# Patient Record
Sex: Female | Born: 1970 | Race: Black or African American | Hispanic: No | Marital: Single | Smoking: Never smoker
Health system: Southern US, Community
[De-identification: ages and names within clinical notes are randomized; demographics above are authoritative.]

## PROBLEM LIST (undated history)

## (undated) DIAGNOSIS — E119 Type 2 diabetes mellitus without complications: Secondary | ICD-10-CM

## (undated) DIAGNOSIS — J329 Chronic sinusitis, unspecified: Secondary | ICD-10-CM

## (undated) DIAGNOSIS — I10 Essential (primary) hypertension: Secondary | ICD-10-CM

## (undated) DIAGNOSIS — J45909 Unspecified asthma, uncomplicated: Secondary | ICD-10-CM

## (undated) DIAGNOSIS — M5412 Radiculopathy, cervical region: Secondary | ICD-10-CM

## (undated) HISTORY — PX: SHOULDER SURGERY: SHX246

## (undated) HISTORY — DX: Chronic sinusitis, unspecified: J32.9

---

## 2003-02-28 DIAGNOSIS — K1120 Sialoadenitis, unspecified: Secondary | ICD-10-CM | POA: Insufficient documentation

## 2004-06-26 ENCOUNTER — Emergency Department: Payer: Self-pay | Admitting: Emergency Medicine

## 2004-10-30 ENCOUNTER — Emergency Department: Payer: Self-pay | Admitting: Emergency Medicine

## 2004-10-31 ENCOUNTER — Emergency Department: Payer: Self-pay | Admitting: Unknown Physician Specialty

## 2004-11-15 ENCOUNTER — Emergency Department: Payer: Self-pay | Admitting: General Practice

## 2005-04-24 ENCOUNTER — Emergency Department: Payer: Self-pay | Admitting: Emergency Medicine

## 2005-08-06 ENCOUNTER — Emergency Department: Payer: Self-pay | Admitting: Emergency Medicine

## 2005-09-13 ENCOUNTER — Emergency Department: Payer: Self-pay | Admitting: Unknown Physician Specialty

## 2005-12-24 ENCOUNTER — Emergency Department: Payer: Self-pay | Admitting: Unknown Physician Specialty

## 2006-05-18 ENCOUNTER — Emergency Department: Payer: Self-pay | Admitting: Internal Medicine

## 2006-07-10 ENCOUNTER — Emergency Department: Payer: Self-pay | Admitting: Emergency Medicine

## 2006-08-05 ENCOUNTER — Emergency Department: Payer: Self-pay | Admitting: General Practice

## 2007-06-02 ENCOUNTER — Emergency Department: Payer: Self-pay | Admitting: Emergency Medicine

## 2007-07-14 ENCOUNTER — Emergency Department: Payer: Self-pay | Admitting: Emergency Medicine

## 2007-10-24 ENCOUNTER — Emergency Department: Payer: Self-pay | Admitting: Emergency Medicine

## 2007-12-07 ENCOUNTER — Emergency Department: Payer: Self-pay | Admitting: Emergency Medicine

## 2008-03-28 ENCOUNTER — Emergency Department: Payer: Self-pay | Admitting: Unknown Physician Specialty

## 2008-04-02 ENCOUNTER — Emergency Department: Payer: Self-pay | Admitting: Emergency Medicine

## 2008-06-15 ENCOUNTER — Emergency Department: Payer: Self-pay | Admitting: Emergency Medicine

## 2008-08-11 ENCOUNTER — Emergency Department: Payer: Self-pay

## 2008-10-09 ENCOUNTER — Emergency Department: Payer: Self-pay | Admitting: Emergency Medicine

## 2009-01-09 ENCOUNTER — Emergency Department: Payer: Self-pay | Admitting: Internal Medicine

## 2009-05-08 ENCOUNTER — Emergency Department: Payer: Self-pay | Admitting: Emergency Medicine

## 2009-06-16 ENCOUNTER — Emergency Department: Payer: Self-pay | Admitting: Emergency Medicine

## 2009-06-28 ENCOUNTER — Emergency Department: Payer: Self-pay | Admitting: Emergency Medicine

## 2010-02-06 ENCOUNTER — Emergency Department: Payer: Self-pay | Admitting: Emergency Medicine

## 2010-04-28 ENCOUNTER — Emergency Department: Payer: Self-pay | Admitting: Emergency Medicine

## 2010-05-19 ENCOUNTER — Emergency Department: Payer: Self-pay | Admitting: Emergency Medicine

## 2010-05-20 ENCOUNTER — Emergency Department: Payer: Self-pay | Admitting: Emergency Medicine

## 2010-08-02 ENCOUNTER — Emergency Department: Payer: Self-pay | Admitting: Emergency Medicine

## 2011-08-14 ENCOUNTER — Emergency Department: Payer: Self-pay | Admitting: *Deleted

## 2011-10-07 ENCOUNTER — Emergency Department: Payer: Self-pay | Admitting: Emergency Medicine

## 2012-05-11 ENCOUNTER — Emergency Department: Payer: Self-pay | Admitting: Emergency Medicine

## 2013-01-21 ENCOUNTER — Emergency Department: Payer: Self-pay | Admitting: Emergency Medicine

## 2013-11-14 ENCOUNTER — Emergency Department: Payer: Self-pay | Admitting: Emergency Medicine

## 2013-12-01 ENCOUNTER — Emergency Department: Payer: Self-pay | Admitting: Emergency Medicine

## 2014-11-10 ENCOUNTER — Emergency Department
Admission: EM | Admit: 2014-11-10 | Discharge: 2014-11-10 | Disposition: A | Discharge status: Home / Self Care | Payer: Commercial Managed Care - PPO | Attending: Emergency Medicine | Admitting: Emergency Medicine

## 2014-11-10 DIAGNOSIS — H9203 Otalgia, bilateral: Secondary | ICD-10-CM | POA: Diagnosis present

## 2014-11-10 DIAGNOSIS — Z880 Allergy status to penicillin: Secondary | ICD-10-CM | POA: Insufficient documentation

## 2014-11-10 DIAGNOSIS — H6693 Otitis media, unspecified, bilateral: Secondary | ICD-10-CM | POA: Diagnosis not present

## 2014-11-10 MED ORDER — AZITHROMYCIN 250 MG PO TABS
ORAL_TABLET | ORAL | Status: AC
  Administered 2014-11-10: 500 mg via ORAL
  Filled 2014-11-10: qty 2

## 2014-11-10 MED ORDER — AZITHROMYCIN 250 MG PO TABS: 500.0000 mg | ORAL_TABLET | Freq: Every day | ORAL | Status: DC

## 2014-11-10 MED ORDER — AZITHROMYCIN 250 MG PO TABS
500.0000 mg | ORAL_TABLET | Freq: Once | ORAL | Status: AC
  Administered 2014-11-10: 500 mg via ORAL

## 2014-11-10 NOTE — ED Provider Notes (Signed)
[facility] Emergency Department Provider Note  ____________________________________________  Time seen: 5:00 AM  I have reviewed the triage vital signs and the nursing notes.   HISTORY  Chief Complaint Otalgia      HPI Cheryl Roberts is a 44 y.o. female presents with bilateral ear pain 6 days. Patient states feels like there is fluid built up behind my ear"        No past medical history on file.  There are no active problems to display for this patient.   No past surgical history on file.  No current outpatient prescriptions on file.  Allergies Amoxicillin  No family history on file.  Social History History  Substance Use Topics  . Smoking status: Not on file  . Smokeless tobacco: Not on file  . Alcohol Use: Not on file    Review of Systems  Constitutional: Negative for fever. Eyes: Negative for visual changes. ENT: Negative for sore throat.bilateral ear pain Cardiovascular: Negative for chest pain. Respiratory: Negative for shortness of breath. Gastrointestinal: Negative for abdominal pain, vomiting and diarrhea. Genitourinary: Negative for dysuria. Musculoskeletal: Negative for back pain. Skin: Negative for rash. Neurological: Negative for headaches, focal weakness or numbness.   10-point ROS otherwise negative.  ____________________________________________   PHYSICAL EXAM:  VITAL SIGNS: ED Triage Vitals  Enc Vitals Group     BP 11/10/14 0343 174/83 mmHg     Pulse Rate 11/10/14 0343 71     Resp 11/10/14 0343 18     Temp 11/10/14 0343 98 F (36.7 C)     Temp Source 11/10/14 0343 Oral     SpO2 11/10/14 0343 100 %     Weight 11/10/14 0343 138 lb (62.596 kg)     Height 11/10/14 0343 4\' 9"  (1.448 m)     Head Cir --      Peak Flow --      Pain Score 11/10/14 0345 10     Pain Loc --      Pain Edu? --      Excl. in GC? --      Constitutional: Alert and oriented. Well appearing and in no distress. Eyes:  Conjunctivae are normal. PERRL. Normal extraocular movements. ENT   Head: Normocephalic and atraumatic.   Nose: No congestion/rhinnorhea.   Mouth/Throat: Mucous membranes are moist.   Neck: No stridor. Ear: bilateral TM erythema, bulging Cardiovascular: Normal rate, regular rhythm. Normal and symmetric distal pulses are present in all extremities. No murmurs, rubs, or gallops. Respiratory: Normal respiratory effort without tachypnea nor retractions. Breath sounds are clear and equal bilaterally. No wheezes/rales/rhonchi. Gastrointestinal: Soft and nontender. No distention. There is no CVA tenderness. Genitourinary: deferred Musculoskeletal: Nontender with normal range of motion in all extremities. No joint effusions.  No lower extremity tenderness nor edema. Neurologic:  Normal speech and language. No gross focal neurologic deficits are appreciated. Speech is normal.  Skin:  Skin is warm, dry and intact. No rash noted. Psychiatric: Mood and affect are normal. Speech and behavior are normal. Patient exhibits appropriate insight and judgment.  ____________________________________________      INITIAL IMPRESSION / ASSESSMENT AND PLAN / ED COURSE  Pertinent labs & imaging results that were available during my care of the patient were reviewed by me and considered in my medical decision making (see chart for details).  H&P consistent with Otitis Media hence Azithromycin given  ____________________________________________   FINAL CLINICAL IMPRESSION(S) / ED DIAGNOSES  Final diagnoses:  Bilateral acute otitis media, recurrence not specified, unspecified  otitis media type      Darci Current, MD 11/10/14 [phone]

## 2014-11-10 NOTE — ED Notes (Signed)
Pt reports bilateral ear pain since last Friday (x6days). Pt reports pain and pressure "like fluid build-up behind my ear). Pt reports some pain relief from OTC APAP and IBU, but pain returns after a few hours. Pt denies any d/c from either ear; denies sore throat, no nausea or vomiting.

## 2014-11-10 NOTE — ED Notes (Signed)
Patient ambulatory to triage with steady gait, without difficulty or distress noted ; pt reports left earache since Friday; taking tylenol with some relief; denies any recent illness

## 2014-11-10 NOTE — Discharge Instructions (Signed)

## 2015-01-27 ENCOUNTER — Encounter: Payer: Self-pay | Admitting: Emergency Medicine

## 2015-01-27 ENCOUNTER — Emergency Department
Admission: EM | Admit: 2015-01-27 | Discharge: 2015-01-27 | Disposition: A | Discharge status: Home / Self Care | Payer: Commercial Managed Care - PPO | Attending: Emergency Medicine | Admitting: Emergency Medicine

## 2015-01-27 DIAGNOSIS — H6502 Acute serous otitis media, left ear: Secondary | ICD-10-CM | POA: Diagnosis not present

## 2015-01-27 DIAGNOSIS — E119 Type 2 diabetes mellitus without complications: Secondary | ICD-10-CM | POA: Insufficient documentation

## 2015-01-27 DIAGNOSIS — H9202 Otalgia, left ear: Secondary | ICD-10-CM | POA: Diagnosis present

## 2015-01-27 DIAGNOSIS — Z880 Allergy status to penicillin: Secondary | ICD-10-CM | POA: Diagnosis not present

## 2015-01-27 HISTORY — DX: Type 2 diabetes mellitus without complications: E11.9

## 2015-01-27 HISTORY — DX: Unspecified asthma, uncomplicated: J45.909

## 2015-01-27 MED ORDER — PREDNISONE 20 MG PO TABS: 40.0000 mg | ORAL_TABLET | Freq: Every day | ORAL | Status: DC

## 2015-01-27 NOTE — Discharge Instructions (Signed)
Serous Otitis Media °Serous otitis media is fluid in the middle ear space. This space contains the bones for hearing and air. Air in the middle ear space helps to transmit sound.  °The air gets there through the eustachian tube. This tube goes from the back of the nose (nasopharynx) to the middle ear space. It keeps the pressure in the middle ear the same as the outside world. It also helps to drain fluid from the middle ear space. °CAUSES  °Serous otitis media occurs when the eustachian tube gets blocked. Blockage can come from: °· Ear infections. °· Colds and other upper respiratory infections. °· Allergies. °· Irritants such as cigarette smoke. °· Sudden changes in air pressure (such as descending in an airplane). °· Enlarged adenoids. °· A mass in the nasopharynx. °During colds and upper respiratory infections, the middle ear space can become temporarily filled with fluid. This can happen after an ear infection also. Once the infection clears, the fluid will generally drain out of the ear through the eustachian tube. If it does not, then serous otitis media occurs. °SIGNS AND SYMPTOMS  °· Hearing loss. °· A feeling of fullness in the ear, without pain. °· Young children may not show any symptoms but may show slight behavioral changes, such as agitation, ear pulling, or crying. °DIAGNOSIS  °Serous otitis media is diagnosed by an ear exam. Tests may be done to check on the movement of the eardrum. Hearing exams may also be done. °TREATMENT  °The fluid most often goes away without treatment. If allergy is the cause, allergy treatment may be helpful. Fluid that persists for several months may require minor surgery. A small tube is placed in the eardrum to: °· Drain the fluid. °· Restore the air in the middle ear space. °In certain situations, antibiotic medicines are used to avoid surgery. Surgery may be done to remove enlarged adenoids (if this is the cause). °HOME CARE INSTRUCTIONS  °· Keep children away from  tobacco smoke. °· Keep all follow-up visits as directed by your health care provider. °SEEK MEDICAL CARE IF:  °· Your hearing is not better in 3 months. °· Your hearing is worse. °· You have ear pain. °· You have drainage from the ear. °· You have dizziness. °· You have serous otitis media only in one ear or have any bleeding from your nose (epistaxis). °· You notice a lump on your neck. °MAKE SURE YOU: °· Understand these instructions.   °· Will watch your condition.   °· Will get help right away if you are not doing well or get worse.   °Document Released: 08/17/2003 Document Revised: 10/11/2013 Document Reviewed: 12/22/2012 °[employer]® Patient Information ©2015 [employer]. This information is not intended to replace advice given to you by your health care provider. Make sure you discuss any questions you have with your health care provider. ° °

## 2015-01-27 NOTE — ED Notes (Signed)
Pt states her left ear has been hurting for a day now, has taken tylenol with some relief.

## 2015-01-27 NOTE — ED Provider Notes (Signed)
CSN: [id number]     Arrival date & time 01/27/15  1826 History   None    Chief Complaint  Patient presents with  . Otalgia     (Consider location/radiation/quality/duration/timing/severity/associated sxs/prior Treatment) HPI  44 year old female presents with 1 day history of left ear pain. Patient states the pain is within the left ear. It feels like pressure. No trauma. No fevers no warmth erythema or drainage. She denies any cough cold congestion or runny nose. No pain over the TMJ or with chewing. She is not taking any medications. She is unable tolerate to decongestions. Pain is mild.  Past Medical History  Diagnosis Date  . Asthma   . Diabetes mellitus without complication    History reviewed. No pertinent past surgical history. No family history on file. Social History  Substance Use Topics  . Smoking status: Never Smoker   . Smokeless tobacco: None  . Alcohol Use: Yes     Comment: occas   OB History    No data available     Review of Systems  Constitutional: Negative for fever, chills, activity change and fatigue.  HENT: Positive for ear pain. Negative for congestion, sinus pressure and sore throat.   Eyes: Negative for visual disturbance.  Respiratory: Negative for cough, chest tightness and shortness of breath.   Cardiovascular: Negative for chest pain and leg swelling.  Gastrointestinal: Negative for nausea, vomiting, abdominal pain and diarrhea.  Genitourinary: Negative for dysuria.  Musculoskeletal: Negative for arthralgias and gait problem.  Skin: Negative for rash.  Neurological: Negative for weakness, numbness and headaches.  Hematological: Negative for adenopathy.  Psychiatric/Behavioral: Negative for behavioral problems, confusion and agitation.      Allergies  Amoxicillin  Home Medications   Prior to Admission medications   Medication Sig Start Date End Date Taking? Authorizing Provider  azithromycin (ZITHROMAX) 250 MG tablet Take 2 tablets  (500 mg total) by mouth daily. 11/10/14   Darci Current, MD  predniSONE (DELTASONE) 20 MG tablet Take 2 tablets (40 mg total) by mouth daily. 01/27/15   Evon Slack, PA-C   BP 164/97 mmHg  Pulse 69  Temp(Src) 98.4 F (36.9 C) (Oral)  Resp 18  Ht 4\' 9"  (1.448 m)  Wt 138 lb (62.596 kg)  BMI 29.85 kg/m2  SpO2 100%  LMP 12/29/2014 Physical Exam  Constitutional: She is oriented to person, place, and time. She appears well-developed and well-nourished. No distress.  HENT:  Head: Normocephalic and atraumatic.  Left Ear: Hearing, external ear and ear canal normal. No lacerations. No drainage, swelling or tenderness. No foreign bodies. No mastoid tenderness. Tympanic membrane is bulging (minimal). Tympanic membrane is not injected, not scarred, not perforated, not erythematous and not retracted. A middle ear effusion (clear serous fluid) is present. No hemotympanum. No decreased hearing is noted.  Mouth/Throat: Oropharynx is clear and moist.  Eyes: EOM are normal. Pupils are equal, round, and reactive to light. Right eye exhibits no discharge. Left eye exhibits no discharge.  Neck: Normal range of motion. Neck supple.  Cardiovascular: Normal rate, regular rhythm and intact distal pulses.   Pulmonary/Chest: Effort normal and breath sounds normal. No respiratory distress. She exhibits no tenderness.  Abdominal: Soft. She exhibits no distension. There is no tenderness.  Musculoskeletal: Normal range of motion. She exhibits no edema.  Neurological: She is alert and oriented to person, place, and time. She has normal reflexes.  Skin: Skin is warm and dry.  Psychiatric: She has a normal mood and affect. Her behavior  is normal. Thought content normal.    ED Course  Procedures (including critical care time) Labs Review Labs Reviewed - No data to display  Imaging Review No results found. I have personally reviewed and evaluated these images and lab results as part of my medical  decision-making.   EKG Interpretation None      MDM   Final diagnoses:  Acute serous otitis media of left ear, recurrence not specified    44 year old female with 1 day history of pressure within the left ear. Hearing is intact. Exam shows increased clear serous fluid on the left TM. Patient is unable to tolerate decongestions. She was given prednisone 40 mg daily 5 days. She will also take ibuprofen 800 mg 3 times a day. Return to the ER for any worsening symptoms urgent changes in her health.    Evon Slack, PA-C 01/27/15 1936  Governor Rooks, MD 02/09/15 1501

## 2015-01-27 NOTE — ED Notes (Signed)
AAOx3.  Skin warm and dry.  NAD.  Ambulates with easy and steady gait.  D/C home 

## 2015-04-17 ENCOUNTER — Encounter: Payer: Self-pay | Admitting: Emergency Medicine

## 2015-04-17 ENCOUNTER — Emergency Department
Admission: EM | Admit: 2015-04-17 | Discharge: 2015-04-17 | Disposition: A | Discharge status: Home / Self Care | Payer: Commercial Managed Care - PPO | Attending: Emergency Medicine | Admitting: Emergency Medicine

## 2015-04-17 DIAGNOSIS — H9201 Otalgia, right ear: Secondary | ICD-10-CM | POA: Diagnosis present

## 2015-04-17 DIAGNOSIS — E119 Type 2 diabetes mellitus without complications: Secondary | ICD-10-CM | POA: Insufficient documentation

## 2015-04-17 DIAGNOSIS — Z880 Allergy status to penicillin: Secondary | ICD-10-CM | POA: Diagnosis not present

## 2015-04-17 DIAGNOSIS — H65191 Other acute nonsuppurative otitis media, right ear: Secondary | ICD-10-CM | POA: Insufficient documentation

## 2015-04-17 MED ORDER — FEXOFENADINE-PSEUDOEPHED ER 60-120 MG PO TB12: 1.0000 | ORAL_TABLET | Freq: Two times a day (BID) | ORAL | Status: DC

## 2015-04-17 MED ORDER — CEPHALEXIN 500 MG PO CAPS: 500.0000 mg | ORAL_CAPSULE | Freq: Four times a day (QID) | ORAL | Status: DC

## 2015-04-17 MED ORDER — TRAMADOL HCL 50 MG PO TABS: 50.0000 mg | ORAL_TABLET | Freq: Four times a day (QID) | ORAL | Status: DC | PRN

## 2015-04-17 NOTE — Discharge Instructions (Signed)
Otitis Media, Adult °Otitis media is redness, soreness, and puffiness (swelling) in the space just behind your eardrum (middle ear). It may be caused by allergies or infection. It often happens along with a cold. °HOME CARE °· Take your medicine as told. Finish it even if you start to feel better. °· Only take over-the-counter or prescription medicines for pain, discomfort, or fever as told by your doctor. °· Follow up with your doctor as told. °GET HELP IF: °· You have otitis media only in one ear, or bleeding from your nose, or both. °· You notice a lump on your neck. °· You are not getting better in 3-5 days. °· You feel worse instead of better. °GET HELP RIGHT AWAY IF:  °· You have pain that is not helped with medicine. °· You have puffiness, redness, or pain around your ear. °· You get a stiff neck. °· You cannot move part of your face (paralysis). °· You notice that the bone behind your ear hurts when you touch it. °MAKE SURE YOU:  °· Understand these instructions. °· Will watch your condition. °· Will get help right away if you are not doing well or get worse. °  °This information is not intended to replace advice given to you by your health care provider. Make sure you discuss any questions you have with your health care provider. °  °Document Released: 11/13/2007 Document Revised: 06/17/2014 Document Reviewed: 12/22/2012 °Elsevier Interactive Patient Education ©2016 [employer] ° ° °

## 2015-04-17 NOTE — ED Provider Notes (Signed)
[facility] Emergency Department Provider Note  ____________________________________________  Time seen: Approximately 7:52 AM  I have reviewed the triage vital signs and the nursing notes.   HISTORY  Chief Complaint Otalgia    HPI Michiko JANEEN WATSON is a 44 y.o. female patient complaining of right ear pain which radiates to the right side of her face for 3 days. Patient also complaining of sinus congestion. Patient denies any hearing loss with this complaint. Patient denies any fever. Patient denies any vertigo. Patient rated her pain discomfort as 8/10. No palliative measures taken for this complaint.   Past Medical History  Diagnosis Date  . Asthma   . Diabetes mellitus without complication (HCC)     There are no active problems to display for this patient.   History reviewed. No pertinent past surgical history.  Current Outpatient Rx  Name  Route  Sig  Dispense  Refill  . metFORMIN (GLUCOPHAGE) 500 MG tablet   Oral   Take by mouth 2 (two) times daily with a meal.         . azithromycin (ZITHROMAX) 250 MG tablet   Oral   Take 2 tablets (500 mg total) by mouth daily.   6 each   0   . cephALEXin (KEFLEX) 500 MG capsule   Oral   Take 1 capsule (500 mg total) by mouth 4 (four) times daily.   40 capsule   0   . fexofenadine-pseudoephedrine (ALLEGRA-D) 60-120 MG 12 hr tablet   Oral   Take 1 tablet by mouth 2 (two) times daily.   30 tablet   0   . predniSONE (DELTASONE) 20 MG tablet   Oral   Take 2 tablets (40 mg total) by mouth daily.   10 tablet   0   . traMADol (ULTRAM) 50 MG tablet   Oral   Take 1 tablet (50 mg total) by mouth every 6 (six) hours as needed for moderate pain.   12 tablet   0     Allergies Amoxicillin  No family history on file.  Social History Social History  Substance Use Topics  . Smoking status: Never Smoker   . Smokeless tobacco: None  . Alcohol Use: Yes     Comment: occas    Review of  Systems Constitutional: No fever/chills Eyes: No visual changes. ENT: No sore throat. Right ear pain Cardiovascular: Denies chest pain. Respiratory: Denies shortness of breath. Gastrointestinal: No abdominal pain.  No nausea, no vomiting.  No diarrhea.  No constipation. Genitourinary: Negative for dysuria. Musculoskeletal: Negative for back pain. Skin: Negative for rash. Neurological: Negative for headaches, focal weakness or numbness. Endocrine:Diabetes Allergic/Immunilogical: Amoxicillin  10-point ROS otherwise negative.  ____________________________________________   PHYSICAL EXAM:  VITAL SIGNS: ED Triage Vitals  Enc Vitals Group     BP 04/17/15 0729 144/78 mmHg     Pulse Rate 04/17/15 0729 69     Resp 04/17/15 0729 18     Temp 04/17/15 0729 98.8 F (37.1 C)     Temp Source 04/17/15 0729 Oral     SpO2 04/17/15 0729 100 %     Weight 04/17/15 0729 136 lb (61.689 kg)     Height 04/17/15 0729  (1.448 m)     Head Cir --      Peak Flow --      Pain Score 04/17/15 0729 8     Pain Loc --      Pain Edu? --      Excl.  in GC? --     Constitutional: Alert and oriented. Well appearing and in no acute distress. Eyes: Conjunctivae are normal. PERRL. EOMI. Head: Atraumatic. Nose: No congestion/rhinnorhea. Mouth/Throat: Mucous membranes are moist.  Oropharynx non-erythematous. Ears: Edematous erythematous right TM. Left TM are unremarkable.   Neck: No stridor. No cervical spine tenderness to palpation. Cardiovascular: Normal rate, regular rhythm. Grossly normal heart sounds.  Good peripheral circulation. Respiratory: Normal respiratory effort.  No retractions. Lungs CTAB. Gastrointestinal: Soft and nontender. No distention. No abdominal bruits. No CVA tenderness. Musculoskeletal: No lower extremity tenderness nor edema.  No joint effusions. Neurologic:  Normal speech and language. No gross focal neurologic deficits are appreciated. No gait instability. Skin:  Skin is warm,  dry and intact. No rash noted. Psychiatric: Mood and affect are normal. Speech and behavior are normal.  ____________________________________________   LABS (all labs ordered are listed, but only abnormal results are displayed)  Labs Reviewed - No data to display ____________________________________________  EKG   ____________________________________________  RADIOLOGY   ____________________________________________   PROCEDURES  Procedure(s) performed: None  Critical Care performed: No  ____________________________________________   INITIAL IMPRESSION / ASSESSMENT AND PLAN / ED COURSE  Pertinent labs & imaging results that were available during my care of the patient were reviewed by me and considered in my medical decision making (see chart for details).  Right otitis media. Patient given a prescription for Keflex and Allegra-D. Patient advised to follow-up family doctor as nose no improvement in 3 days. Preterm and your condition worsens. ____________________________________________   FINAL CLINICAL IMPRESSION(S) / ED DIAGNOSES  Final diagnoses:  Acute nonsuppurative otitis media of right ear      Joni ReiningRonald K Smith, PA-C 04/17/15 [id number]  Sharman CheekPhillip Stafford, MD 04/18/15 1521

## 2015-04-17 NOTE — ED Notes (Signed)
States she developed right ear pain which radiates into right side of face since friday

## 2015-06-25 ENCOUNTER — Emergency Department
Admission: EM | Admit: 2015-06-25 | Discharge: 2015-06-25 | Disposition: A | Discharge status: Home / Self Care | Payer: Commercial Managed Care - PPO | Attending: Emergency Medicine | Admitting: Emergency Medicine

## 2015-06-25 ENCOUNTER — Encounter: Payer: Self-pay | Admitting: Emergency Medicine

## 2015-06-25 DIAGNOSIS — Z7984 Long term (current) use of oral hypoglycemic drugs: Secondary | ICD-10-CM | POA: Insufficient documentation

## 2015-06-25 DIAGNOSIS — M62830 Muscle spasm of back: Secondary | ICD-10-CM | POA: Insufficient documentation

## 2015-06-25 DIAGNOSIS — E119 Type 2 diabetes mellitus without complications: Secondary | ICD-10-CM | POA: Diagnosis not present

## 2015-06-25 DIAGNOSIS — Z7952 Long term (current) use of systemic steroids: Secondary | ICD-10-CM | POA: Diagnosis not present

## 2015-06-25 DIAGNOSIS — Z79899 Other long term (current) drug therapy: Secondary | ICD-10-CM | POA: Diagnosis not present

## 2015-06-25 DIAGNOSIS — M542 Cervicalgia: Secondary | ICD-10-CM | POA: Diagnosis present

## 2015-06-25 DIAGNOSIS — Z880 Allergy status to penicillin: Secondary | ICD-10-CM | POA: Diagnosis not present

## 2015-06-25 DIAGNOSIS — Z792 Long term (current) use of antibiotics: Secondary | ICD-10-CM | POA: Insufficient documentation

## 2015-06-25 DIAGNOSIS — M62838 Other muscle spasm: Secondary | ICD-10-CM

## 2015-06-25 MED ORDER — MELOXICAM 7.5 MG PO TABS: 7.5000 mg | ORAL_TABLET | Freq: Two times a day (BID) | ORAL | Status: DC

## 2015-06-25 MED ORDER — CYCLOBENZAPRINE HCL 5 MG PO TABS: 5.0000 mg | ORAL_TABLET | Freq: Three times a day (TID) | ORAL | Status: DC | PRN

## 2015-06-25 NOTE — ED Notes (Signed)
x4 days of stiff left lateral/ neck pain, " I woke one morning, like I slept on it wrong"

## 2015-06-25 NOTE — ED Provider Notes (Signed)
CSN: [id number]     Arrival date & time 06/25/15  1500 History   First MD Initiated Contact with Patient 06/25/15 1556     Chief Complaint  Patient presents with  . Torticollis     (Consider location/radiation/quality/duration/timing/severity/associated sxs/prior Treatment) HPI  45 year old female presents emergent department for evaluation of left-sided neck pain. Symptoms have been present for 4-5 days. She has a history of cervical muscle spasms. She denies any recent trauma or injury. She states her pain occurred just upon awakening and she believes it was the way she was sleeping at night. She describes the pain as tightness and stiffness. No numbness tingling or radicular symptoms. She has not been taking any medications for pain or muscle relaxers. She denies any kidney problems or GI irritation with NSAIDs.   Past Medical History  Diagnosis Date  . Asthma   . Diabetes mellitus without complication (HCC)    History reviewed. No pertinent past surgical history. No family history on file. Social History  Substance Use Topics  . Smoking status: Never Smoker   . Smokeless tobacco: None  . Alcohol Use: Yes     Comment: occas   OB History    No data available     Review of Systems  Constitutional: Negative for fever, chills, activity change and fatigue.  HENT: Negative for congestion, sinus pressure and sore throat.   Eyes: Negative for visual disturbance.  Respiratory: Negative for cough, chest tightness and shortness of breath.   Cardiovascular: Negative for chest pain and leg swelling.  Gastrointestinal: Negative for nausea, vomiting, abdominal pain and diarrhea.  Genitourinary: Negative for dysuria.  Musculoskeletal: Positive for neck pain ( left-sided). Negative for arthralgias and gait problem.  Skin: Negative for rash.  Neurological: Negative for weakness, numbness and headaches.  Hematological: Negative for adenopathy.  Psychiatric/Behavioral: Negative for  behavioral problems, confusion and agitation.      Allergies  Amoxicillin  Home Medications   Prior to Admission medications   Medication Sig Start Date End Date Taking? Authorizing Provider  azithromycin (ZITHROMAX) 250 MG tablet Take 2 tablets (500 mg total) by mouth daily. 11/10/14   Darci Current, MD  cephALEXin (KEFLEX) 500 MG capsule Take 1 capsule (500 mg total) by mouth 4 (four) times daily. 04/17/15   Joni Reining, PA-C  cyclobenzaprine (FLEXERIL) 5 MG tablet Take 1 tablet (5 mg total) by mouth every 8 (eight) hours as needed for muscle spasms. 06/25/15   Evon Slack, PA-C  fexofenadine-pseudoephedrine (ALLEGRA-D) 60-120 MG 12 hr tablet Take 1 tablet by mouth 2 (two) times daily. 04/17/15   Joni Reining, PA-C  meloxicam (MOBIC) 7.5 MG tablet Take 1 tablet (7.5 mg total) by mouth 2 (two) times daily. 06/25/15 06/24/16  Evon Slack, PA-C  metFORMIN (GLUCOPHAGE) 500 MG tablet Take by mouth 2 (two) times daily with a meal.    Historical Provider, MD  predniSONE (DELTASONE) 20 MG tablet Take 2 tablets (40 mg total) by mouth daily. 01/27/15   Evon Slack, PA-C  traMADol (ULTRAM) 50 MG tablet Take 1 tablet (50 mg total) by mouth every 6 (six) hours as needed for moderate pain. 04/17/15   Joni Reining, PA-C   BP 153/78 mmHg  Pulse 70  Temp(Src) 98.8 F (37.1 C) (Oral)  Resp 18  Ht 4\' 9"  (1.448 m)  Wt 65.318 kg  BMI 31.15 kg/m2  SpO2 99%  LMP 06/01/2015 Physical Exam  Constitutional: She is oriented to person, place, and time. She  appears well-developed and well-nourished. No distress.  HENT:  Head: Normocephalic and atraumatic.  Mouth/Throat: Oropharynx is clear and moist.  Eyes: EOM are normal. Pupils are equal, round, and reactive to light. Right eye exhibits no discharge. Left eye exhibits no discharge.  Neck: Normal range of motion. Neck supple.  Cardiovascular: Normal rate, regular rhythm and intact distal pulses.   Pulmonary/Chest: Effort normal and breath  sounds normal. No respiratory distress. She exhibits no tenderness.  Musculoskeletal:  Cervical Spine: Examination of the cervical spine reveals no bony abnormality, no edema, and no ecchymosis.  There is no step-off.  The patient has full active and passive range of motion of the cervical spine with flexion, extension, and right and left bend with rotation.  There is no crepitus with range of motion exercises.  The patient is non-tender along the spinous process to palpation.  The patient has mild left paravertebral muscle tenderness and spasms.  There is no parascapular discomfort.  The patient has a negative axial compression test.  The patient has a negative Spurling test.  The patient has a negative overhead arm test for thoracic outlet syndrome.    Bilateral Upper Extremity: Examination of the bilateral shoulder and arm showed no bony abnormality or edema.  The patient has normal active and passive motion with abduction, flexion, internal rotation, and external rotation.  The patient has no tenderness with motion.  The patient has a negative Hawkins test and a negative impingement test.  The patient has a negative drop arm test.  The patient is non-tender along the deltoid muscle.  There is no subacromial space tenderness with no AC joint tenderness.  The patient has no instability of the shoulder with anterior-posterior motion.  There is a negative sulcus sign.  The rotator cuff muscle strength is 5/5 with supraspinatus, 5/5 with internal rotation, and 5/5 with external rotation.  There is no crepitus with range of motion activities.      Neurological: She is alert and oriented to person, place, and time. She has normal reflexes.  Skin: Skin is warm and dry.  Psychiatric: She has a normal mood and affect. Her behavior is normal. Thought content normal.    ED Course  Procedures (including critical care time) Labs Review Labs Reviewed - No data to display  Imaging Review No results found. I  have personally reviewed and evaluated these images and lab results as part of my medical decision-making.   EKG Interpretation None      MDM   Final diagnoses:  Cervical paraspinal muscle spasm    45 year old female with 4-5 history of left sided cervical muscle spasms. She will be started with meloxicam, Flexeril. She will use warm moist heat. Work on gentle stretching exercises of the cervical spine. If no improvement in 4-5 days, would recommend follow-up with orthopedics. Return to the ER for any worsening symptoms urgent changes in the patient's health.    Evon Slackhomas C Gaines, PA-C 06/25/15 1622  Governor Rooksebecca Lord, MD 06/28/15 [id number]

## 2015-06-25 NOTE — ED Notes (Signed)
Patient in lower neck to upper back started a couple of days ago. Pt states pain has increased today. Pt has full ROM of neck. Has a history of muscle spasms in the past.

## 2015-06-25 NOTE — Discharge Instructions (Signed)
Heat Therapy  Heat therapy can help ease sore, stiff, injured, and tight muscles and joints. Heat relaxes your muscles, which may help ease your pain.   RISKS AND COMPLICATIONS  If you have any of the following conditions, do not use heat therapy unless your health care provider has approved:   Poor circulation.   Healing wounds or scarred skin in the area being treated.   Diabetes, heart disease, or high blood pressure.   Not being able to feel (numbness) the area being treated.   Unusual swelling of the area being treated.   Active infections.   Blood clots.   Cancer.   Inability to communicate pain. This may include young children and people who have problems with their brain function (dementia).   Pregnancy.  Heat therapy should only be used on old, pre-existing, or long-lasting (chronic) injuries. Do not use heat therapy on new injuries unless directed by your health care provider.  HOW TO USE HEAT THERAPY  There are several different kinds of heat therapy, including:   Moist heat pack.   Warm water bath.   Hot water bottle.   Electric heating pad.   Heated gel pack.   Heated wrap.   Electric heating pad.  Use the heat therapy method suggested by your health care provider. Follow your health care provider's instructions on when and how to use heat therapy.  GENERAL HEAT THERAPY RECOMMENDATIONS   Do not sleep while using heat therapy. Only use heat therapy while you are awake.   Your skin may turn pink while using heat therapy. Do not use heat therapy if your skin turns red.   Do not use heat therapy if you have new pain.   High heat or long exposure to heat can cause burns. Be careful when using heat therapy to avoid burning your skin.   Do not use heat therapy on areas of your skin that are already irritated, such as with a rash or sunburn.  SEEK MEDICAL CARE IF:   You have blisters, redness, swelling, or numbness.   You have new pain.   Your pain is worse.  MAKE SURE  YOU:   Understand these instructions.   Will watch your condition.   Will get help right away if you are not doing well or get worse.     This information is not intended to replace advice given to you by your health care provider. Make sure you discuss any questions you have with your health care provider.     Document Released: 08/19/2011 Document Revised: 06/17/2014 Document Reviewed: 07/20/2013  Elsevier Interactive Patient Education 2016 [employer]

## 2015-08-18 ENCOUNTER — Emergency Department
Admission: EM | Admit: 2015-08-18 | Discharge: 2015-08-18 | Disposition: A | Discharge status: Home / Self Care | Payer: Commercial Managed Care - PPO | Attending: Emergency Medicine | Admitting: Emergency Medicine

## 2015-08-18 ENCOUNTER — Encounter: Payer: Self-pay | Admitting: Medical Oncology

## 2015-08-18 ENCOUNTER — Emergency Department: Payer: Commercial Managed Care - PPO

## 2015-08-18 DIAGNOSIS — E119 Type 2 diabetes mellitus without complications: Secondary | ICD-10-CM | POA: Insufficient documentation

## 2015-08-18 DIAGNOSIS — Z7984 Long term (current) use of oral hypoglycemic drugs: Secondary | ICD-10-CM | POA: Diagnosis not present

## 2015-08-18 DIAGNOSIS — J45909 Unspecified asthma, uncomplicated: Secondary | ICD-10-CM | POA: Diagnosis not present

## 2015-08-18 DIAGNOSIS — R05 Cough: Secondary | ICD-10-CM | POA: Diagnosis present

## 2015-08-18 DIAGNOSIS — J069 Acute upper respiratory infection, unspecified: Secondary | ICD-10-CM | POA: Insufficient documentation

## 2015-08-18 LAB — RAPID INFLUENZA A&B ANTIGENS
Influenza A (ARMC): NEGATIVE
Influenza B (ARMC): NEGATIVE

## 2015-08-18 MED ORDER — AZITHROMYCIN 250 MG PO TABS: ORAL_TABLET | ORAL | Status: DC

## 2015-08-18 MED ORDER — PSEUDOEPH-BROMPHEN-DM 30-2-10 MG/5ML PO SYRP: 10.0000 mL | ORAL_SOLUTION | Freq: Four times a day (QID) | ORAL | Status: DC | PRN

## 2015-08-18 NOTE — ED Notes (Signed)
Pt reports cough, body aches and low fever off and on for a few weeks, pt reports worsened 3 days ago.

## 2015-08-18 NOTE — ED Provider Notes (Signed)
[facility] Emergency Department Provider Note  ____________________________________________  Time seen: Approximately 3:29 PM  I have reviewed the triage vital signs and the nursing notes.   HISTORY  Chief Complaint Cough and Generalized Body Aches    HPI Cheryl Roberts is a 45 y.o. female , NAD, presents to the emergency department with 3-4 week history of cough and chest congestion. Notes she's had low-grade fevers off and on throughout that time. Notes some body aches and fevers worsened over the last 2-3 days. Was noted to have a temperature of 100.3 F at work today. Denies fatigue, weakness. Notes that she had what she thought was a sinus infection 2 weeks ago that has resolved without intervention. Has taken TheraFlu and other over-the-counter medications with no resolution of her cough. 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 Denies abdominal pain, nausea, vomiting, diarrhea. Has had no chest pain or shortness of breath.   Past Medical History  Diagnosis Date  . Asthma   . Diabetes mellitus without complication (HCC)     There are no active problems to display for this patient.   History reviewed. No pertinent past surgical history.  Current Outpatient Rx  Name  Route  Sig  Dispense  Refill  . azithromycin (ZITHROMAX Z-PAK) 250 MG tablet      Take 2 tablets (500 mg) on  Day 1,  followed by 1 tablet (250 mg) once daily on Days 2 through 5.   6 each   0   . brompheniramine-pseudoephedrine-DM 30-2-10 MG/5ML syrup   Oral   Take 10 mLs by mouth 4 (four) times daily as needed.   200 mL   0   . cyclobenzaprine (FLEXERIL) 5 MG tablet   Oral   Take 1 tablet (5 mg total) by mouth every 8 (eight) hours as needed for muscle spasms.   30 tablet   1   . fexofenadine-pseudoephedrine (ALLEGRA-D) 60-120 MG 12 hr tablet   Oral   Take 1 tablet by mouth 2 (two) times daily.   30 tablet   0   . meloxicam  (MOBIC) 7.5 MG tablet   Oral   Take 1 tablet (7.5 mg total) by mouth 2 (two) times daily.   30 tablet   0   . metFORMIN (GLUCOPHAGE) 500 MG tablet   Oral   Take by mouth 2 (two) times daily with a meal.           Allergies Amoxicillin  No family history on file.  Social History Social History  Substance Use Topics  . Smoking status: Never Smoker   . Smokeless tobacco: None  . Alcohol Use: Yes     Comment: occas     Review of Systems  Constitutional: Positive fever/chills. No fatigue. Eyes: No visual changes. No discharge or erythema, swelling ENT: Eyes of nasal congestion, runny nose, sinus pressure. No sore throat, ear pain, sneezing. Cardiovascular: No chest pain. Respiratory: Positive chest congestion, cough. No shortness of breath. No wheezing.  Gastrointestinal: No abdominal pain.  No nausea, vomiting.  No diarrhea.   Musculoskeletal: Positive for general myalgias.  Skin: Negative for rash. Neurological: Negative for headaches, focal weakness or numbness. 10-point ROS otherwise negative.  ____________________________________________   PHYSICAL EXAM:  VITAL SIGNS: ED Triage Vitals  Enc Vitals Group     BP 08/18/15 1422 164/83 mmHg     Pulse Rate 08/18/15 1422 95     Resp 08/18/15 1422  18     Temp 08/18/15 1422 99.3 F (37.4 C)     Temp Source 08/18/15 1422 Oral     SpO2 08/18/15 1422 98 %     Weight 08/18/15 1422 144 lb (65.318 kg)     Height 08/18/15 1422 4\' 9"  (1.448 m)     Head Cir --      Peak Flow --      Pain Score 08/18/15 1423 9     Pain Loc --      Pain Edu? --      Excl. in GC? --     Constitutional: Alert and oriented. Well appearing and in no acute distress. Eyes: Conjunctivae are normal. PERRL. EOMI without pain.  Head: Atraumatic. ENT:      Ears: TMs visualized bilaterally without erythema, injection, effusion, bulging, perforation.      Nose: Mild congestion trace clear rhinnorhea. Turbinates with mild injection.       Mouth/Throat: Mucous membranes are moist. Pharynx without erythema, swelling, exudate. Neck: Upper with full range of motion. Hematological/Lymphatic/Immunilogical: No cervical lymphadenopathy. Cardiovascular: Normal rate, regular rhythm. Normal S1 and S2.  Good peripheral circulation. Respiratory: Normal respiratory effort without tachypnea or retractions. Lungs CTAB. Neurologic:  Normal speech and language. No gross focal neurologic deficits are appreciated.  Skin:  Skin is warm, dry and intact. No rash noted. Psychiatric: Mood and affect are normal. Speech and behavior are normal. Patient exhibits appropriate insight and judgement.   ____________________________________________   LABS (all labs ordered are listed, but only abnormal results are displayed)  Labs Reviewed  RAPID INFLUENZA A&B ANTIGENS ([facility] ONLY)   ____________________________________________  EKG  None ____________________________________________  RADIOLOGY I have personally viewed and evaluated these images (plain radiographs) as part of my medical decision making, as well as reviewing the written report by the radiologist.  Dg Chest 2 View  08/18/2015  CLINICAL DATA:  Cough for 3 weeks, headache, eye pain today, body aches EXAM: CHEST  2 VIEW COMPARISON:  08/06/2005 FINDINGS: Cardiomediastinal silhouette is stable. No acute infiltrate or pleural effusion. No pulmonary edema. Mild degenerative changes mid thoracic spine. IMPRESSION: No active cardiopulmonary disease. Electronically Signed   By: Natasha MeadLiviu  Pop M.D.   On: 08/18/2015 16:14    ____________________________________________    PROCEDURES  Procedure(s) performed: None    Medications - No data to display   ____________________________________________   INITIAL IMPRESSION / ASSESSMENT AND PLAN / ED COURSE  Pertinent labs & imaging results that were available during my care of the patient were reviewed by me and considered in my medical decision  making (see chart for details).  Patient's diagnosis is consistent with upper respiratory infection. Patient will be discharged home with prescriptions for azithromycin and Bromfed-DM syrup to take as directed. Patient is to follow up with primary care provider if symptoms persist past this treatment course. Patient is given ED precautions to return to the ED for any worsening or new symptoms.    ____________________________________________  FINAL CLINICAL IMPRESSION(S) / ED DIAGNOSES  Final diagnoses:  Upper respiratory infection      NEW MEDICATIONS STARTED DURING THIS VISIT:  New Prescriptions   AZITHROMYCIN (ZITHROMAX Z-PAK) 250 MG TABLET    Take 2 tablets (500 mg) on  Day 1,  followed by 1 tablet (250 mg) once daily on Days 2 through 5.   BROMPHENIRAMINE-PSEUDOEPHEDRINE-DM 30-2-10 MG/5ML SYRUP    Take 10 mLs by mouth 4 (four) times daily as needed.         Kamarah Bilotta  Jon Gills, PA-C 08/18/15 1627  Sharyn Creamer, MD 08/19/15 2359

## 2015-08-18 NOTE — Discharge Instructions (Signed)
Upper Respiratory Infection, Adult Most upper respiratory infections (URIs) are a viral infection of the air passages leading to the lungs. A URI affects the nose, throat, and upper air passages. The most common type of URI is nasopharyngitis and is typically referred to as "the common cold." URIs run their course and usually go away on their own. Most of the time, a URI does not require medical attention, but sometimes a bacterial infection in the upper airways can follow a viral infection. This is called a secondary infection. Sinus and middle ear infections are common types of secondary upper respiratory infections. Bacterial pneumonia can also complicate a URI. A URI can worsen asthma and chronic obstructive pulmonary disease (COPD). Sometimes, these complications can require emergency medical care and may be life threatening.  CAUSES Almost all URIs are caused by viruses. A virus is a type of germ and can spread from one person to another.  RISKS FACTORS You may be at risk for a URI if:   You smoke.   You have chronic heart or lung disease.  You have a weakened defense (immune) system.   You are very young or very old.   You have nasal allergies or asthma.  You work in crowded or poorly ventilated areas.  You work in health care facilities or schools. SIGNS AND SYMPTOMS  Symptoms typically develop 2-3 days after you come in contact with a cold virus. Most viral URIs last 7-10 days. However, viral URIs from the influenza virus (flu virus) can last 14-18 days and are typically more severe. Symptoms may include:   Runny or stuffy (congested) nose.   Sneezing.   Cough.   Sore throat.   Headache.   Fatigue.   Fever.   Loss of appetite.   Pain in your forehead, behind your eyes, and over your cheekbones (sinus pain).  Muscle aches.  DIAGNOSIS  Your health care provider may diagnose a URI by:  Physical exam.  Tests to check that your symptoms are not due to  another condition such as:  Strep throat.  Sinusitis.  Pneumonia.  Asthma. TREATMENT  A URI goes away on its own with time. It cannot be cured with medicines, but medicines may be prescribed or recommended to relieve symptoms. Medicines may help:  Reduce your fever.  Reduce your cough.  Relieve nasal congestion. HOME CARE INSTRUCTIONS   Take medicines only as directed by your health care provider.   Gargle warm saltwater or take cough drops to comfort your throat as directed by your health care provider.  Use a warm mist humidifier or inhale steam from a shower to increase air moisture. This may make it easier to breathe.  Drink enough fluid to keep your urine clear or pale yellow.   Eat soups and other clear broths and maintain good nutrition.   Rest as needed.   Return to work when your temperature has returned to normal or as your health care provider advises. You may need to stay home longer to avoid infecting others. You can also use a face mask and careful hand washing to prevent spread of the virus.  Increase the usage of your inhaler if you have asthma.   Do not use any tobacco products, including cigarettes, chewing tobacco, or electronic cigarettes. If you need help quitting, ask your health care provider. PREVENTION  The best way to protect yourself from getting a cold is to practice good hygiene.   Avoid oral or hand contact with people with cold   symptoms.   Wash your hands often if contact occurs.  There is no clear evidence that vitamin C, vitamin E, echinacea, or exercise reduces the chance of developing a cold. However, it is always recommended to get plenty of rest, exercise, and practice good nutrition.  SEEK MEDICAL CARE IF:   You are getting worse rather than better.   Your symptoms are not controlled by medicine.   You have chills.  You have worsening shortness of breath.  You have brown or red mucus.  You have yellow or brown nasal  discharge.  You have pain in your face, especially when you bend forward.  You have a fever.  You have swollen neck glands.  You have pain while swallowing.  You have white areas in the back of your throat. SEEK IMMEDIATE MEDICAL CARE IF:   You have severe or persistent:  Headache.  Ear pain.  Sinus pain.  Chest pain.  You have chronic lung disease and any of the following:  Wheezing.  Prolonged cough.  Coughing up blood.  A change in your usual mucus.  You have a stiff neck.  You have changes in your:  Vision.  Hearing.  Thinking.  Mood. MAKE SURE YOU:   Understand these instructions.  Will watch your condition.  Will get help right away if you are not doing well or get worse.   This information is not intended to replace advice given to you by your health care provider. Make sure you discuss any questions you have with your health care provider.   Document Released: 11/20/2000 Document Revised: 10/11/2014 Document Reviewed: 09/01/2013 Elsevier Interactive Patient Education 2016 Elsevier Inc.  

## 2015-09-17 ENCOUNTER — Encounter: Payer: Self-pay | Admitting: Emergency Medicine

## 2015-09-17 ENCOUNTER — Emergency Department
Admission: EM | Admit: 2015-09-17 | Discharge: 2015-09-17 | Disposition: A | Discharge status: Home / Self Care | Payer: Commercial Managed Care - PPO | Attending: Emergency Medicine | Admitting: Emergency Medicine

## 2015-09-17 DIAGNOSIS — Y999 Unspecified external cause status: Secondary | ICD-10-CM | POA: Diagnosis not present

## 2015-09-17 DIAGNOSIS — J45909 Unspecified asthma, uncomplicated: Secondary | ICD-10-CM | POA: Insufficient documentation

## 2015-09-17 DIAGNOSIS — S29012A Strain of muscle and tendon of back wall of thorax, initial encounter: Secondary | ICD-10-CM

## 2015-09-17 DIAGNOSIS — Y929 Unspecified place or not applicable: Secondary | ICD-10-CM | POA: Insufficient documentation

## 2015-09-17 DIAGNOSIS — Z7984 Long term (current) use of oral hypoglycemic drugs: Secondary | ICD-10-CM | POA: Diagnosis not present

## 2015-09-17 DIAGNOSIS — E119 Type 2 diabetes mellitus without complications: Secondary | ICD-10-CM | POA: Diagnosis not present

## 2015-09-17 DIAGNOSIS — Y939 Activity, unspecified: Secondary | ICD-10-CM | POA: Diagnosis not present

## 2015-09-17 DIAGNOSIS — S39012A Strain of muscle, fascia and tendon of lower back, initial encounter: Secondary | ICD-10-CM | POA: Diagnosis not present

## 2015-09-17 DIAGNOSIS — Z794 Long term (current) use of insulin: Secondary | ICD-10-CM | POA: Insufficient documentation

## 2015-09-17 DIAGNOSIS — M549 Dorsalgia, unspecified: Secondary | ICD-10-CM | POA: Diagnosis present

## 2015-09-17 DIAGNOSIS — X58XXXA Exposure to other specified factors, initial encounter: Secondary | ICD-10-CM | POA: Insufficient documentation

## 2015-09-17 MED ORDER — METHOCARBAMOL 750 MG PO TABS: 750.0000 mg | ORAL_TABLET | Freq: Four times a day (QID) | ORAL | Status: DC

## 2015-09-17 MED ORDER — TRAMADOL HCL 50 MG PO TABS: 50.0000 mg | ORAL_TABLET | Freq: Four times a day (QID) | ORAL | Status: DC | PRN

## 2015-09-17 MED ORDER — TRAMADOL HCL 50 MG PO TABS
50.0000 mg | ORAL_TABLET | Freq: Once | ORAL | Status: AC
Start: 2015-09-17 — End: 2015-09-17
  Administered 2015-09-17: 50 mg via ORAL
  Filled 2015-09-17: qty 1

## 2015-09-17 MED ORDER — METHOCARBAMOL 500 MG PO TABS
1000.0000 mg | ORAL_TABLET | Freq: Once | ORAL | Status: AC
  Administered 2015-09-17: 1000 mg via ORAL
  Filled 2015-09-17: qty 2

## 2015-09-17 NOTE — Discharge Instructions (Signed)
Muscle Strain °A muscle strain (pulled muscle) happens when a muscle is stretched beyond normal length. It happens when a sudden, violent force stretches your muscle too far. Usually, a few of the fibers in your muscle are torn. Muscle strain is common in athletes. Recovery usually takes 1-2 weeks. Complete healing takes 5-6 weeks.  °HOME CARE  °· Follow the PRICE method of treatment to help your injury get better. Do this the first 2-3 days after the injury: °¨ Protect. Protect the muscle to keep it from getting injured again. °¨ Rest. Limit your activity and rest the injured body part. °¨ Ice. Put ice in a plastic bag. Place a towel between your skin and the bag. Then, apply the ice and leave it on from 15-20 minutes each hour. After the third day, switch to moist heat packs. °¨ Compression. Use a splint or elastic bandage on the injured area for comfort. Do not put it on too tightly. °¨ Elevate. Keep the injured body part above the level of your heart. °· Only take medicine as told by your doctor. °· Warm up before doing exercise to prevent future muscle strains. °GET HELP IF:  °· You have more pain or puffiness (swelling) in the injured area. °· You feel numbness, tingling, or notice a loss of strength in the injured area. °MAKE SURE YOU:  °· Understand these instructions. °· Will watch your condition. °· Will get help right away if you are not doing well or get worse. °  °This information is not intended to replace advice given to you by your health care provider. Make sure you discuss any questions you have with your health care provider. °  °Document Released: 03/05/2008 Document Revised: 03/17/2013 Document Reviewed: 12/24/2012 °Elsevier Interactive Patient Education ©2016 Elsevier Inc. ° °

## 2015-09-17 NOTE — ED Notes (Signed)
States bent over in chair last night and felt her muscles pull, then tonight when bending over to clean tub she had pain in back

## 2015-09-17 NOTE — ED Provider Notes (Signed)
[facility] Emergency Department Provider Note  ____________________________________________  Time seen: Approximately 10:24 PM  I have reviewed the triage vital signs and the nursing notes.   HISTORY  Chief Complaint Back Pain    HPI Cheryl Roberts is a 45 y.o. female patient complaining of right low back pain for 2 days. Onset was last night when she bent over last minute she felt a pull on the right side. Patient stated the pain increase and when she bent over to clean the toe about the shower it became worse. Patient denies any radicular component to this pain. Patient denies any bladder or bowel dysfunction. No palliative measures taken for this complaint. Patient's rate the pain as a 10 over 10. Patient described a pain shot. Patient also the pain becomes spasmodic with left lateral movements.   Past Medical History  Diagnosis Date  . Asthma   . Diabetes mellitus without complication (HCC)     There are no active problems to display for this patient.   History reviewed. No pertinent past surgical history.  Current Outpatient Rx  Name  Route  Sig  Dispense  Refill  . azithromycin (ZITHROMAX Z-PAK) 250 MG tablet      Take 2 tablets (500 mg) on  Day 1,  followed by 1 tablet (250 mg) once daily on Days 2 through 5.   6 each   0   . brompheniramine-pseudoephedrine-DM 30-2-10 MG/5ML syrup   Oral   Take 10 mLs by mouth 4 (four) times daily as needed.   200 mL   0   . cyclobenzaprine (FLEXERIL) 5 MG tablet   Oral   Take 1 tablet (5 mg total) by mouth every 8 (eight) hours as needed for muscle spasms.   30 tablet   1   . fexofenadine-pseudoephedrine (ALLEGRA-D) 60-120 MG 12 hr tablet   Oral   Take 1 tablet by mouth 2 (two) times daily.   30 tablet   0   . meloxicam (MOBIC) 7.5 MG tablet   Oral   Take 1 tablet (7.5 mg total) by mouth 2 (two) times daily.   30 tablet   0   . metFORMIN (GLUCOPHAGE) 500 MG tablet   Oral   Take by mouth  2 (two) times daily with a meal.         . methocarbamol (ROBAXIN-750) 750 MG tablet   Oral   Take 1 tablet (750 mg total) by mouth 4 (four) times daily.   20 tablet   0   . traMADol (ULTRAM) 50 MG tablet   Oral   Take 1 tablet (50 mg total) by mouth every 6 (six) hours as needed.   20 tablet   0     Allergies Amoxicillin  History reviewed. No pertinent family history.  Social History Social History  Substance Use Topics  . Smoking status: Never Smoker   . Smokeless tobacco: None  . Alcohol Use: Yes     Comment: occas    Review of Systems Constitutional: No fever/chills Eyes: No visual changes. ENT: No sore throat. Cardiovascular: Denies chest pain. Respiratory: Denies shortness of breath. Gastrointestinal: No abdominal pain.  No nausea, no vomiting.  No diarrhea.  No constipation. Genitourinary: Negative for dysuria. Musculoskeletal: Negative for back pain. Skin: Negative for rash. Neurological: Negative for headaches, focal weakness or numbness. Endocrine:Diabetes Hematological/Lymphatic: Allergic/Immunilogical: [place]  10-point ROS otherwise negative.  ____________________________________________   PHYSICAL EXAM:  VITAL SIGNS: ED Triage Vitals  Enc Vitals Group  BP 09/17/15 2204 185/88 mmHg     Pulse Rate 09/17/15 2204 70     Resp 09/17/15 2204 18     Temp 09/17/15 2204 98 F (36.7 C)     Temp Source 09/17/15 2204 Oral     SpO2 09/17/15 2204 100 %     Weight 09/17/15 2204 142 lb (64.411 kg)     Height 09/17/15 2204  (1.448 m)     Head Cir --      Peak Flow --      Pain Score 09/17/15 2207 10     Pain Loc --      Pain Edu? --      Excl. in GC? --     Constitutional: Alert and oriented. Well appearing and in no acute distress. Eyes: Conjunctivae are normal. PERRL. EOMI. Head: Atraumatic. Nose: No congestion/rhinnorhea. Mouth/Throat: Mucous membranes are moist.  Oropharynx non-erythematous. Neck: No stridor.  No cervical spine  tenderness to palpation. Hematological/Lymphatic/Immunilogical: No cervical lymphadenopathy. Cardiovascular: Normal rate, regular rhythm. Grossly normal heart sounds.  Good peripheral circulation. Respiratory: Normal respiratory effort.  No retractions. Lungs CTAB. Gastrointestinal: Soft and nontender. No distention. No abdominal bruits. No CVA tenderness. Musculoskeletal: No obvious deformity to the lumbar spine. Patient has no guarding palpation of the spinal processes. Patient has some moderate guarding palpation of the right paraspinal muscle area. Patient has moderate muscle spasm with left lateral movements on the right side. Patient has negative straight leg test. Patient has heavy reliance of the upper extremities for sitting and standing. Neurologic:  Normal speech and language. No gross focal neurologic deficits are appreciated. No gait instability. Skin:  Skin is warm, dry and intact. No rash noted. Psychiatric: Mood and affect are normal. Speech and behavior are normal.  ____________________________________________   LABS (all labs ordered are listed, but only abnormal results are displayed)  Labs Reviewed - No data to display ____________________________________________  EKG   ____________________________________________  RADIOLOGY   ____________________________________________   PROCEDURES  Procedure(s) performed: None  Critical Care performed: No  ____________________________________________   INITIAL IMPRESSION / ASSESSMENT AND PLAN / ED COURSE  Pertinent labs & imaging results that were available during my care of the patient were reviewed by me and considered in my medical decision making (see chart for details).  Strain right back. Patient given discharge care instructions. Patient given prescription for tramadol and Robaxin. Patient given a work note for one day. Advised to follow-up with family doctor if no improvement in 2-3  days. ____________________________________________   FINAL CLINICAL IMPRESSION(S) / ED DIAGNOSES  Final diagnoses:  Muscle strain of right upper back, initial encounter      Joni Reining, PA-C 09/17/15 2237  Myrna Blazer, MD 09/18/15 [phone]

## 2015-12-12 ENCOUNTER — Emergency Department
Admission: EM | Admit: 2015-12-12 | Discharge: 2015-12-12 | Disposition: A | Discharge status: Home / Self Care | Payer: Commercial Managed Care - PPO | Attending: Emergency Medicine | Admitting: Emergency Medicine

## 2015-12-12 ENCOUNTER — Encounter: Payer: Self-pay | Admitting: Emergency Medicine

## 2015-12-12 DIAGNOSIS — J45909 Unspecified asthma, uncomplicated: Secondary | ICD-10-CM | POA: Diagnosis not present

## 2015-12-12 DIAGNOSIS — Z79899 Other long term (current) drug therapy: Secondary | ICD-10-CM | POA: Insufficient documentation

## 2015-12-12 DIAGNOSIS — E119 Type 2 diabetes mellitus without complications: Secondary | ICD-10-CM | POA: Insufficient documentation

## 2015-12-12 DIAGNOSIS — H6123 Impacted cerumen, bilateral: Secondary | ICD-10-CM

## 2015-12-12 DIAGNOSIS — I10 Essential (primary) hypertension: Secondary | ICD-10-CM | POA: Diagnosis not present

## 2015-12-12 DIAGNOSIS — Z7984 Long term (current) use of oral hypoglycemic drugs: Secondary | ICD-10-CM | POA: Insufficient documentation

## 2015-12-12 DIAGNOSIS — Z792 Long term (current) use of antibiotics: Secondary | ICD-10-CM | POA: Insufficient documentation

## 2015-12-12 DIAGNOSIS — H6091 Unspecified otitis externa, right ear: Secondary | ICD-10-CM | POA: Insufficient documentation

## 2015-12-12 DIAGNOSIS — H9201 Otalgia, right ear: Secondary | ICD-10-CM | POA: Diagnosis present

## 2015-12-12 HISTORY — DX: Essential (primary) hypertension: I10

## 2015-12-12 MED ORDER — NEOMYCIN-POLYMYXIN-HC 3.5-10000-1 OT SOLN: 3.0000 [drp] | Freq: Three times a day (TID) | OTIC | Status: AC

## 2015-12-12 MED ORDER — NEOMYCIN-POLYMYXIN-HC 3.5-10000-1 OT SOLN: 3.0000 [drp] | Freq: Three times a day (TID) | OTIC | Status: DC

## 2015-12-12 NOTE — Discharge Instructions (Signed)
Ear Drops, Adult °You have been diagnosed with a condition requiring you to put drops of medicine into your outer ear. °HOME CARE INSTRUCTIONS  °· Put drops in the affected ear as instructed. After putting the drops in, you will need to lie down with the affected ear facing up for ten minutes so the drops will remain in the ear canal and run down and fill the canal. Continue using the ear drops for as long as directed by your health care provider. °· Prior to getting up, put a cotton ball gently in your ear canal. Leave enough of the cotton ball out so it can be easily removed. Do not attempt to push this down into the canal with a cotton-tipped swab or other instrument. °· Do not irrigate or wash out your ears if you have had a perforated eardrum or mastoid surgery, or unless instructed to do so by your health care provider. °· Keep appointments with your health care provider as instructed. °· Finish all medicine, or use for the length of time prescribed by your health care provider. Continue the drops even if your problem seems to be doing well after a couple days, or continue as instructed. °SEEK MEDICAL CARE IF: °· You become worse or develop increasing pain. °· You notice any unusual drainage from your ear (particularly if the drainage has a bad smell). °· You develop hearing difficulties. °· You experience a serious form of dizziness in which you feel as if the room is spinning, and you feel nauseated (vertigo). °· The outside of your ear becomes red or swollen or both. This may be a sign of an allergic reaction. °MAKE SURE YOU:  °· Understand these instructions. °· Will watch your condition. °· Will get help right away if you are not doing well or get worse. °  °This information is not intended to replace advice given to you by your health care provider. Make sure you discuss any questions you have with your health care provider. °  °Document Released: 05/21/2001 Document Revised: 06/17/2014 Document  Reviewed: 12/22/2012 °Elsevier Interactive Patient Education ©2016 [employer] ° °Otitis Externa °Otitis externa is a bacterial or fungal infection of the outer ear canal. This is the area from the eardrum to the outside of the ear. Otitis externa is sometimes called "swimmer's ear." °CAUSES  °Possible causes of infection include: °· Swimming in dirty water. °· Moisture remaining in the ear after swimming or bathing. °· Mild injury (trauma) to the ear. °· Objects stuck in the ear (foreign body). °· Cuts or scrapes (abrasions) on the outside of the ear. °SIGNS AND SYMPTOMS  °The first symptom of infection is often itching in the ear canal. Later signs and symptoms may include swelling and redness of the ear canal, ear pain, and yellowish-white fluid (pus) coming from the ear. The ear pain may be worse when pulling on the earlobe. °DIAGNOSIS  °Your health care provider will perform a physical exam. A sample of fluid may be taken from the ear and examined for bacteria or fungi. °TREATMENT  °Antibiotic ear drops are often given for 10 to 14 days. Treatment may also include pain medicine or corticosteroids to reduce itching and swelling. °HOME CARE INSTRUCTIONS  °· Apply antibiotic ear drops to the ear canal as prescribed by your health care provider. °· Take medicines only as directed by your health care provider. °· If you have diabetes, follow any additional treatment instructions from your health care provider. °· Keep all follow-up visits   as directed by your health care provider. PREVENTION   Keep your ear dry. Use the corner of a towel to absorb water out of the ear canal after swimming or bathing.  Avoid scratching or putting objects inside your ear. This can damage the ear canal or remove the protective wax that lines the canal. This makes it easier for bacteria and fungi to grow.  Avoid swimming in lakes, polluted water, or poorly chlorinated pools.  You may use ear drops made of rubbing alcohol and  vinegar after swimming. Combine equal parts of white vinegar and alcohol in a bottle. Put 3 or 4 drops into each ear after swimming. SEEK MEDICAL CARE IF:   You have a fever.  Your ear is still red, swollen, painful, or draining pus after 3 days.  Your redness, swelling, or pain gets worse.  You have a severe headache.  You have redness, swelling, pain, or tenderness in the area behind your ear. MAKE SURE YOU:   Understand these instructions.  Will watch your condition.  Will get help right away if you are not doing well or get worse.   This information is not intended to replace advice given to you by your health care provider. Make sure you discuss any questions you have with your health care provider.   Document Released: 05/27/2005 Document Revised: 06/17/2014 Document Reviewed: 06/13/2011 Elsevier Interactive Patient Education 2016 Elsevier Inc[employer]  Cerumen Impaction The structures of the external ear canal secrete a waxy substance known as cerumen. Excess cerumen can build up in the ear canal, causing a condition known as cerumen impaction. Cerumen impaction can cause ear pain and disrupt the function of the ear. The rate of cerumen production differs for each individual. In certain individuals, the configuration of the ear canal may decrease his or her ability to naturally remove cerumen. CAUSES Cerumen impaction is caused by excessive cerumen production or buildup. RISK FACTORS  Frequent use of swabs to clean ears.  Having narrow ear canals.  Having eczema.  Being dehydrated. SIGNS AND SYMPTOMS  Diminished hearing.  Ear drainage.  Ear pain.  Ear itch. TREATMENT Treatment may involve:  Over-the-counter or prescription ear drops to soften the cerumen.  Removal of cerumen by a health care provider. This may be done with:  Irrigation with warm water. This is the most common method of removal.  Ear curettes and other instruments.  Surgery. This may be done  in severe cases. HOME CARE INSTRUCTIONS  Take medicines only as directed by your health care provider.  Do not insert objects into the ear with the intent of cleaning the ear. PREVENTION  Do not insert objects into the ear, even with the intent of cleaning the ear. Removing cerumen as a part of normal hygiene is not necessary, and the use of swabs in the ear canal is not recommended.  Drink enough water to keep your urine clear or pale yellow.  Control your eczema if you have it. SEEK MEDICAL CARE IF:  You develop ear pain.  You develop bleeding from the ear.  The cerumen does not clear after you use ear drops as directed.   This information is not intended to replace advice given to you by your health care provider. Make sure you discuss any questions you have with your health care provider.   Document Released: 07/04/2004 Document Revised: 06/17/2014 Document Reviewed: 01/11/2015 Elsevier Interactive Patient Education 2016 Elsevier Inc.    Use over-the-counter earwax cleaner in addition to the prescribed antibiotic  ear drops. Do not stick anything into your ears.

## 2015-12-12 NOTE — ED Notes (Signed)
Developed pain to right ear this am  Denies any fever or drainage

## 2015-12-12 NOTE — ED Provider Notes (Signed)
[facility] Emergency Department Provider Note  ____________________________________________  Time seen: Approximately 3:06 PM  I have reviewed the triage vital signs and the nursing notes.   HISTORY  Chief Complaint Otalgia    HPI Cheryl Roberts is a 45 y.o. female who presents emergency department complaining of right-sided ear pain 1 day. Patient states that she woke up with the symptoms this morning. She denies any hearing changes. She denies any fevers or chills, nasal congestion, sinus pressure, sore throat, difficulty breathing or swallowing, chest pain, shortness of breath, abdominal pain, nausea or vomiting. Patient is not tried any medications for same. Patient had a history of "ear problems" several years prior that she saw an ENT specialist for. Patient is unsure of the diagnosis at that time.   Past Medical History  Diagnosis Date  . Asthma   . Diabetes mellitus without complication (HCC)   . Hypertension     There are no active problems to display for this patient.   History reviewed. No pertinent past surgical history.  Current Outpatient Rx  Name  Route  Sig  Dispense  Refill  . azithromycin (ZITHROMAX Z-PAK) 250 MG tablet      Take 2 tablets (500 mg) on  Day 1,  followed by 1 tablet (250 mg) once daily on Days 2 through 5.   6 each   0   . brompheniramine-pseudoephedrine-DM 30-2-10 MG/5ML syrup   Oral   Take 10 mLs by mouth 4 (four) times daily as needed.   200 mL   0   . cyclobenzaprine (FLEXERIL) 5 MG tablet   Oral   Take 1 tablet (5 mg total) by mouth every 8 (eight) hours as needed for muscle spasms.   30 tablet   1   . fexofenadine-pseudoephedrine (ALLEGRA-D) 60-120 MG 12 hr tablet   Oral   Take 1 tablet by mouth 2 (two) times daily.   30 tablet   0   . meloxicam (MOBIC) 7.5 MG tablet   Oral   Take 1 tablet (7.5 mg total) by mouth 2 (two) times daily.   30 tablet   0   . metFORMIN (GLUCOPHAGE) 500 MG  tablet   Oral   Take by mouth 2 (two) times daily with a meal.         . methocarbamol (ROBAXIN-750) 750 MG tablet   Oral   Take 1 tablet (750 mg total) by mouth 4 (four) times daily.   20 tablet   0   . neomycin-polymyxin-hydrocortisone (CORTISPORIN) otic solution   Right Ear   Place 3 drops into the right ear 3 (three) times daily.   10 mL   0   . traMADol (ULTRAM) 50 MG tablet   Oral   Take 1 tablet (50 mg total) by mouth every 6 (six) hours as needed.   20 tablet   0     Allergies Amoxicillin  No family history on file.  Social History Social History  Substance Use Topics  . Smoking status: Never Smoker   . Smokeless tobacco: None  . Alcohol Use: Yes     Comment: occas     Review of Systems  Constitutional: No fever/chills Eyes: No visual changes. No discharge ENT: Positive for right-sided ear pain. Cardiovascular: no chest pain. Respiratory: no cough. No SOB. Skin: Negative for rash, abrasions, lacerations, ecchymosis. Neurological: Negative for headaches, focal weakness or numbness. 10-point ROS otherwise negative.  ____________________________________________   PHYSICAL EXAM:  VITAL SIGNS: ED Triage  Vitals  Enc Vitals Group     BP --      Pulse --      Resp --      Temp --      Temp src --      SpO2 --      Weight --      Height --      Head Cir --      Peak Flow --      Pain Score --      Pain Loc --      Pain Edu? --      Excl. in GC? --      Constitutional: Alert and oriented. Well appearing and in no acute distress. Eyes: Conjunctivae are normal. PERRL. EOMI. Head: Atraumatic. ENT:      Ears: Cc bilaterally have moderate to severe amounts of cerumen. EAC on right side has erythema identified deep to cerumen. TM is not well visualized but what is visualized reveals no erythema or bulging.      Nose: No congestion/rhinnorhea.      Mouth/Throat: Mucous membranes are moist.  Neck: No stridor.    Hematological/Lymphatic/Immunilogical: No cervical lymphadenopathy. Cardiovascular: Normal rate, regular rhythm. Normal S1 and S2.  Good peripheral circulation. Respiratory: Normal respiratory effort without tachypnea or retractions. Lungs CTAB. Good air entry to the bases with no decreased or absent breath sounds. Musculoskeletal: Full range of motion to all extremities. No gross deformities appreciated. Neurologic:  Normal speech and language. No gross focal neurologic deficits are appreciated.  Skin:  Skin is warm, dry and intact. No rash noted. Psychiatric: Mood and affect are normal. Speech and behavior are normal. Patient exhibits appropriate insight and judgement.   ____________________________________________   LABS (all labs ordered are listed, but only abnormal results are displayed)  Labs Reviewed - No data to display ____________________________________________  EKG   ____________________________________________  RADIOLOGY  No results found.  ____________________________________________    PROCEDURES  Procedure(s) performed:       Medications - No data to display   ____________________________________________   INITIAL IMPRESSION / ASSESSMENT AND PLAN / ED COURSE  Pertinent labs & imaging results that were available during my care of the patient were reviewed by me and considered in my medical decision making (see chart for details).  Patient's diagnosis is consistent with otitis externa to the right ear. Patient has significant amounts of cerumen in addition to this complaint. Patient is advised to use cerumen cleaner as well as prescribed antibiotic ear drops for symptom relief.. Patient will be discharged home with prescriptions for antibiotic eardrops. Patient is to follow up with ENT provider as needed or otherwise directed. Patient is given ED precautions to return to the ED for any worsening or new  symptoms.     ____________________________________________  FINAL CLINICAL IMPRESSION(S) / ED DIAGNOSES  Final diagnoses:  Otitis externa, right  Excessive cerumen in both ear canals      NEW MEDICATIONS STARTED DURING THIS VISIT:  New Prescriptions   NEOMYCIN-POLYMYXIN-HYDROCORTISONE (CORTISPORIN) OTIC SOLUTION    Place 3 drops into the right ear 3 (three) times daily.        This chart was dictated using voice recognition software/Dragon. Despite best efforts to proofread, errors can occur which can change the meaning. Any change was purely unintentional.    Racheal PatchesJonathan D Amariyah Bazar, PA-C 12/12/15 1539  Jeanmarie PlantJames A McShane, MD 12/12/15 [phone]

## 2015-12-31 ENCOUNTER — Encounter: Payer: Self-pay | Admitting: Emergency Medicine

## 2015-12-31 ENCOUNTER — Emergency Department
Admission: EM | Admit: 2015-12-31 | Discharge: 2015-12-31 | Disposition: A | Discharge status: Home / Self Care | Payer: Commercial Managed Care - PPO | Attending: Emergency Medicine | Admitting: Emergency Medicine

## 2015-12-31 DIAGNOSIS — Z7984 Long term (current) use of oral hypoglycemic drugs: Secondary | ICD-10-CM | POA: Insufficient documentation

## 2015-12-31 DIAGNOSIS — J45909 Unspecified asthma, uncomplicated: Secondary | ICD-10-CM | POA: Diagnosis not present

## 2015-12-31 DIAGNOSIS — K1379 Other lesions of oral mucosa: Secondary | ICD-10-CM | POA: Diagnosis present

## 2015-12-31 DIAGNOSIS — Z79899 Other long term (current) drug therapy: Secondary | ICD-10-CM | POA: Diagnosis not present

## 2015-12-31 DIAGNOSIS — Z791 Long term (current) use of non-steroidal anti-inflammatories (NSAID): Secondary | ICD-10-CM | POA: Insufficient documentation

## 2015-12-31 DIAGNOSIS — K121 Other forms of stomatitis: Secondary | ICD-10-CM

## 2015-12-31 DIAGNOSIS — Z792 Long term (current) use of antibiotics: Secondary | ICD-10-CM | POA: Insufficient documentation

## 2015-12-31 DIAGNOSIS — I10 Essential (primary) hypertension: Secondary | ICD-10-CM | POA: Diagnosis not present

## 2015-12-31 DIAGNOSIS — E119 Type 2 diabetes mellitus without complications: Secondary | ICD-10-CM | POA: Diagnosis not present

## 2015-12-31 MED ORDER — FIRST-DUKES MOUTHWASH MT SUSP: 5.0000 mL | Freq: Four times a day (QID) | OROMUCOSAL | 0 refills | Status: DC | PRN

## 2015-12-31 NOTE — ED Triage Notes (Signed)
Pt states she was eating a potato a week ago that was hot and felt like she had blisters in her mouth. Continues to have pain a week later.

## 2015-12-31 NOTE — ED Provider Notes (Signed)
[facility] Emergency Department Provider Note  ____________________________________________  Time seen: Approximately 4:03 PM  I have reviewed the triage vital signs and the nursing notes.   HISTORY  Chief Complaint Mouth Lesions    HPI Cheryl Roberts is a 45 y.o. female presents for evaluation of having a pain/sore on the right lower jaw gum line. Patient reports eating extremely hot potato and suffering with some blisters in her mouth.   Past Medical History:  Diagnosis Date  . Asthma   . Diabetes mellitus without complication (HCC)   . Hypertension     There are no active problems to display for this patient.   History reviewed. No pertinent surgical history.  Current Outpatient Rx  . Order #: [id number] Class: Print  . Order #: [id number] Class: Print  . Order #: [id number] Class: Print  . Order #: [id number] Class: Print  . Order #: [id number] Class: Print  . Order #: [id number] Class: Print  . Order #: [id number] Class: Historical Med  . Order #: [id number] Class: Print  . Order #: [id number] Class: Print    Allergies Amoxicillin  History reviewed. No pertinent family history.  Social History Social History  Substance Use Topics  . Smoking status: Never Smoker  . Smokeless tobacco: Never Used  . Alcohol use Yes     Comment: occas    Review of Systems Constitutional: No fever/chills Eyes: No visual changes. ENT: Positive ulceration noted to the right lower lateral aspect of the gums. Cardiovascular: Denies chest pain. Respiratory: Denies shortness of breath. Musculoskeletal: Negative for back pain. Skin: Negative for rash. Neurological: Negative for headaches, focal weakness or numbness.  10-point ROS otherwise negative.  ____________________________________________   PHYSICAL EXAM:  VITAL SIGNS: ED Triage Vitals [12/31/15 1533]  Enc Vitals Group     BP (!) 169/89     Pulse Rate 78     Resp 18     Temp 98.3 F (36.8 C)   Temp Source Oral     SpO2 98 %     Weight 142 lb (64.4 kg)     Height  (1.448 m)     Head Circumference      Peak Flow      Pain Score 0     Pain Loc      Pain Edu?      Excl. in GC?     Constitutional: Alert and oriented. Well appearing and in no acute distress. Nose: No congestion/rhinnorhea. Mouth/Throat: Mucous membranes are moist.  Oropharynx non-erythematous. Ulcerations noted to the lateral aspect lower gums consistent with aphthous ulcers. Neck: No stridor.  No cervical adenopathy noted.  Cardiovascular: Normal rate, regular rhythm. Grossly normal heart sounds.  Good peripheral circulation. Respiratory: Normal respiratory effort.  No retractions. Lungs CTAB. Gastrointestinal: Soft and nontender. No distention. No abdominal bruits. No CVA tenderness. Musculoskeletal: No lower extremity tenderness nor edema.  No joint effusions. Neurologic:  Normal speech and language. No gross focal neurologic deficits are appreciated. No gait instability. Skin:  Skin is warm, dry and intact. No rash noted. Psychiatric: Mood and affect are normal. Speech and behavior are normal.  ____________________________________________   LABS (all labs ordered are listed, but only abnormal results are displayed)  Labs Reviewed - No data to display ____________________________________________  EKG   ____________________________________________  RADIOLOGY   ____________________________________________   PROCEDURES  Procedure(s) performed: None  Critical Care performed: No  ____________________________________________   INITIAL IMPRESSION / ASSESSMENT AND PLAN / ED COURSE  Pertinent labs & imaging results that were available during  my care of the patient were reviewed by me and considered in my medical decision making (see chart for details).  Aphthous stomatitis. Rx given for [employer] mouthwash reassurance provided patient follow up PCP or return here with any worsening  symptomology.  Clinical Course    ____________________________________________   FINAL CLINICAL IMPRESSION(S) / ED DIAGNOSES  Final diagnoses:  Stomatitis     This chart was dictated using voice recognition software/Dragon. Despite best efforts to proofread, errors can occur which can change the meaning. Any change was purely unintentional.    Evangeline Dakin, PA-C 12/31/15 1705    Sharman Cheek, MD 01/03/16 [phone]

## 2016-02-07 ENCOUNTER — Other Ambulatory Visit: Payer: Self-pay | Admitting: *Deleted

## 2016-02-09 ENCOUNTER — Encounter: Payer: Self-pay | Admitting: Family Medicine

## 2016-02-09 ENCOUNTER — Ambulatory Visit (INDEPENDENT_AMBULATORY_CARE_PROVIDER_SITE_OTHER): Payer: Commercial Managed Care - PPO | Admitting: Family Medicine

## 2016-02-09 VITALS — BP 164/78 | HR 88 | Temp 98.8°F | Resp 16 | Ht <= 58 in | Wt 146.0 lb

## 2016-02-09 DIAGNOSIS — E119 Type 2 diabetes mellitus without complications: Secondary | ICD-10-CM

## 2016-02-09 DIAGNOSIS — J45909 Unspecified asthma, uncomplicated: Secondary | ICD-10-CM | POA: Insufficient documentation

## 2016-02-09 DIAGNOSIS — J4520 Mild intermittent asthma, uncomplicated: Secondary | ICD-10-CM

## 2016-02-09 DIAGNOSIS — M47812 Spondylosis without myelopathy or radiculopathy, cervical region: Secondary | ICD-10-CM

## 2016-02-09 DIAGNOSIS — I10 Essential (primary) hypertension: Secondary | ICD-10-CM

## 2016-02-09 DIAGNOSIS — E669 Obesity, unspecified: Secondary | ICD-10-CM

## 2016-02-09 DIAGNOSIS — E1169 Type 2 diabetes mellitus with other specified complication: Secondary | ICD-10-CM | POA: Insufficient documentation

## 2016-02-09 LAB — POCT GLYCOSYLATED HEMOGLOBIN (HGB A1C): HEMOGLOBIN A1C: 7

## 2016-02-09 MED ORDER — CYCLOBENZAPRINE HCL 10 MG PO TABS: 5.0000 mg | ORAL_TABLET | Freq: Two times a day (BID) | ORAL | 2 refills | Status: DC | PRN

## 2016-02-09 MED ORDER — LISINOPRIL 10 MG PO TABS: 10.0000 mg | ORAL_TABLET | Freq: Every day | ORAL | 3 refills | Status: DC

## 2016-02-09 MED ORDER — ALBUTEROL SULFATE HFA 108 (90 BASE) MCG/ACT IN AERS: 2.0000 | INHALATION_SPRAY | RESPIRATORY_TRACT | 1 refills | Status: DC | PRN

## 2016-02-09 MED ORDER — HYDROCHLOROTHIAZIDE 25 MG PO TABS: 25.0000 mg | ORAL_TABLET | Freq: Every day | ORAL | 3 refills | Status: DC

## 2016-02-09 MED ORDER — METFORMIN HCL 500 MG PO TABS: 500.0000 mg | ORAL_TABLET | Freq: Two times a day (BID) | ORAL | 3 refills | Status: DC

## 2016-02-09 NOTE — Progress Notes (Signed)
Subjective:    Patient ID: Cheryl Capuchinammie M Speagle, female    DOB: 09-12-1970, 45 y.o.   MRN: [id number]  Cheryl Roberts is a 45 y.o. female presenting on 02/09/2016 for Establish Care  HPI   CHRONIC DM, Type 2: Reports no concerns, last A1c 2013 CBGs: Avg 100-120s, Low 90, High 170. Checks CBGs 2x daily (am fasting, 2 hr post prandial) Meds: Metformin 500mg  BID Reports good compliance. Tolerating well w/o side-effects No longer on ACEi. No known allergy or reaction. Lifestyle: Diet (does admit to eating everything, does eat variety of carbs, no particular DM diet) Denies hypoglycemia, polyuria, visual changes, numbness or tingling.  CHRONIC HTN: Reports prior history of HTN for several years. Current Meds - none, previously on HCTZ 25mg  and Lisinopril 10mg  daily, has been out since >6 months, previous provider discontinued, patient difficulty in describing why but states they "interacted with her diabetes". Previously tolerated these fine. She would like to resume these medications today. 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), occasionally walks outside 1-2x weekly Denies CP, dyspnea, HA, edema, dizziness / lightheadedness  C-Spine DJD C5-C6 / CHRONIC PAIN / MUSCLE SPASMS / LOW BACK PAIN: - Chronic problem, denies known injury or recent re-injury - [facility], last C-spine x-ray 06/2015 DJD, C5-C6, never had MRI scheduled - Previously took Flexeril 10mg  BID PRN with some relief, currently out, requesting refill - Additionally reports mid to lower back muscle spasms, worse with turning or twisting in a certain way, improved with standing / stretching - Denies any active neck or back pain today - Denies any numbness, tingling or paresthesias in arms or legs, loss of bowel or bladder function, saddle anesthesia, trauma or fall  H/o ASTMA, CHRONIC SINUSITIS: - Chronic problems, uses albuterol inhaler PRN, usually about 1x q 3 months with asthma or sinusitis flare,  usually triggered by cold or infection. Never required ED visit or hospitalization for asthma or breathing. - Today doing well - Denies any night-time awakening, cough, sinus headache or pressure, congestion   Past Medical History:  Diagnosis Date  . Asthma   . Chronic sinusitis   . Diabetes mellitus without complication (HCC)   . Hypertension    Social History   Social History  . Marital status: Single    Spouse name: N/A  . Number of children: N/A  . Years of education: N/A   Occupational History  . Not on file.   Social History Main Topics  . Smoking status: Never Smoker  . Smokeless tobacco: Never Used  . Alcohol use Yes     Comment: occas  . Drug use: No  . Sexual activity: Not on file   Other Topics Concern  . Not on file   Social History Narrative  . No narrative on file   Family History  Problem Relation Age of Onset  . Hypertension Mother   . Hyperthyroidism Mother   . Gout Mother   . Diabetes Mother   . Cancer Father    No current outpatient prescriptions on file prior to visit.   No current facility-administered medications on file prior to visit.     Review of Systems Per HPI unless specifically indicated above     Objective:    BP (!) 164/78 (BP Location: Left Arm, Cuff Size: Normal)   Pulse 88   Temp 98.8 F (37.1 C) (Oral)   Resp 16   Ht 4\' 7"  (1.397 m)   Wt 146 lb (66.2 kg)  BMI 33.93 kg/m   Wt Readings from Last 3 Encounters:  02/09/16 146 lb (66.2 kg)  12/31/15 142 lb (64.4 kg)  12/12/15 142 lb (64.4 kg)    Physical Exam  Constitutional: She is oriented to person, place, and time. She appears well-developed and well-nourished. No distress.  Short stature, overweight, well-appearing, comfortable, cooperative  HENT:  Head: Normocephalic and atraumatic.  Mouth/Throat: Oropharynx is clear and moist.  Eyes: Conjunctivae and EOM are normal. Pupils are equal, round, and reactive to light.  Neck: Normal range of motion. Neck  supple. No thyromegaly present.  Cardiovascular: Normal rate, regular rhythm, normal heart sounds and intact distal pulses.   No murmur heard. Pulmonary/Chest: Effort normal and breath sounds normal. No respiratory distress. She has no wheezes. She has no rales.  Abdominal: Soft. Bowel sounds are normal. She exhibits no distension and no mass. There is no tenderness.  Musculoskeletal: Normal range of motion. She exhibits no edema or tenderness.  Mild mid back muscle spasms L > R with hypertonicity. Seated SLR negative for radiculopathy.  Lymphadenopathy:    She has no cervical adenopathy.  Neurological: She is alert and oriented to person, place, and time.  Skin: Skin is warm and dry. She is not diaphoretic.  Psychiatric: She has a normal mood and affect. Her behavior is normal.  Nursing note and vitals reviewed.  DM FOOT EXAM - normal appearance, no lesions, calluses, or ulcers, intact sensation to monofilament bilaterally   Results for orders placed or performed in visit on 02/09/16  POCT HgB A1C  Result Value Ref Range   Hemoglobin A1C 7.0        Assessment & Plan:   Problem List Items Addressed This Visit    Obesity    Counseled on healthy lifestyle with diet / exercise modifications      Relevant Medications   metFORMIN (GLUCOPHAGE) 500 MG tablet   Other Relevant Orders   Lipid panel   Hypertension    Uncontrolled HTN, remains elevated on re-check, off all anti-HTN meds >6 months No complications   Plan:  1. Restart HCTZ 25mg  daily and Lisinopril 10mg  daily 2. Check CMET, follow serum Cr, K after restarting ACE 3. Lifestyle Mods - Improve exercise, Dec salt intake, inc K+ rich vegs 4. Monitor BP at home or at drug store occasionally 5. Follow-up 1 month re-check BP       Relevant Medications   hydrochlorothiazide (HYDRODIURIL) 25 MG tablet   lisinopril (PRINIVIL,ZESTRIL) 10 MG tablet   Other Relevant Orders   COMPLETE METABOLIC PANEL WITH GFR   Lipid panel    DJD (degenerative joint disease) of cervical spine    Currently improved on chronic C-spine DJD C5-6 predominantly, reviewed outside record and report of last X-ray 06/2015, never scheduled C-spine MRI, followed by [facility]  Plan: 1. Refilled Flexeril PRN, conservative measures Tylenol, heating pad 2. Follow-up with [facility] as needed if worsening, future consider C-spine MRI as previously planned      Relevant Medications   cyclobenzaprine (FLEXERIL) 10 MG tablet   Diabetes mellitus without complication (HCC) - Primary    Well-controlled on metformin, A1c 7.0 today, last 6.6 4 years ago, never on insulin No complications or hypoglycemia.  Plan:  1. Refilled Metformin 500mg  BID - future consider increase if worsening A1c 2. May decrease CBG checks to 1x daily or PRN if A1c controlled and not on any other DM meds / insulin 3. DM foot exam performed today, normal 4. Referral to Ophthalmology  5. Check future fasting labs with cmet, lipid panel, will calculate ASCVD risk and discuss starting statin therapy, ASA therapy 6. Future will need pneumonia vaccine - F/u A1c 3 mo      Relevant Medications   lisinopril (PRINIVIL,ZESTRIL) 10 MG tablet   metFORMIN (GLUCOPHAGE) 500 MG tablet   Asthma, mild intermittent    Controlled, infrequent exacerbations only with sinusitis or URI Refilled Albuterol PRN      Relevant Medications   albuterol (PROVENTIL HFA;VENTOLIN HFA) 108 (90 Base) MCG/ACT inhaler    Other Visit Diagnoses   None.     Meds ordered this encounter  Medications  . hydrochlorothiazide (HYDRODIURIL) 25 MG tablet    Sig: Take 1 tablet (25 mg total) by mouth daily.    Dispense:  90 tablet    Refill:  3  . lisinopril (PRINIVIL,ZESTRIL) 10 MG tablet    Sig: Take 1 tablet (10 mg total) by mouth daily.    Dispense:  90 tablet    Refill:  3  . albuterol (PROVENTIL HFA;VENTOLIN HFA) 108 (90 Base) MCG/ACT inhaler    Sig: Inhale 2 puffs into the lungs every 2 (two) hours as  needed for wheezing or shortness of breath (cough).    Dispense:  1 Inhaler    Refill:  1  . cyclobenzaprine (FLEXERIL) 10 MG tablet    Sig: Take 0.5-1 tablets (5-10 mg total) by mouth 2 (two) times daily as needed for muscle spasms.    Dispense:  30 tablet    Refill:  2  . metFORMIN (GLUCOPHAGE) 500 MG tablet    Sig: Take 1 tablet (500 mg total) by mouth 2 (two) times daily with a meal.    Dispense:  180 tablet    Refill:  3      Follow up plan: Return in 1 month (on 03/10/2016) for blood pressure.  [name], DO [facility] 02/09/2016, 5:05 PM

## 2016-02-09 NOTE — Assessment & Plan Note (Addendum)
Well-controlled on metformin, A1c 7.0 today, last 6.6 4 years ago, never on insulin No complications or hypoglycemia.  Plan:  1. Refilled Metformin 500mg  BID - future consider increase if worsening A1c 2. May decrease CBG checks to 1x daily or PRN if A1c controlled and not on any other DM meds / insulin 3. DM foot exam performed today, normal 4. Referral to Ophthalmology 5. Check future fasting labs with cmet, lipid panel, will calculate ASCVD risk and discuss starting statin therapy, ASA therapy 6. Future will need pneumonia vaccine - F/u A1c 3 mo

## 2016-02-09 NOTE — Patient Instructions (Signed)
Thank you for coming in to clinic today.  1. Diabetes is well controlled A1c 7.0, continue metformin 500mg  twice a day, keep working on healthy diet see advice below 2. Start new blood pressure medications once daily  Recommend to start taking Tylenol Extra Strength 500mg  tabs - take 1 to 2 tabs (max 1000mg  per dose) every 6 hours for pain (take regularly, don't skip a dose for next 3-7 days), max 24 hour daily dose is 6 to 8 tablets or 3000 to 4000mg   Start Cyclobenzapine (Flexeril) 10mg  tablets (muscle relaxant) - cut in half for 5mg  at night for muscle relaxant - may make you sedated or sleepy (be careful driving or working on this) if tolerated you can take half to whole tab 2 to 3 times daily or every 8 hours as needed   Eat at least 3 meals and 1-2 snacks per day (don't skip breakfast).  Aim for no more than 5 hours between eating. - Tip: If you go >5 hours without eating and become very hungry, your body will supply it's own resources temporarily and you can gain extra weight when you eat.   Diet Recommendations for Diabetes   Starchy (carb) foods include: Bread, rice, pasta, potatoes, corn, crackers, bagels, muffins, all baked goods.   Protein foods include: Meat, fish, poultry, eggs, dairy foods, and beans such as pinto and kidney beans (beans also provide carbohydrate).   1. Eat at least 3 meals and 1-2 snacks per day. Never go more than 4-5 hours while awake without eating.   2. Limit starchy foods to TWO per meal and ONE per snack. ONE portion of a starchy  food is equal to the following:   - ONE slice of bread (or its equivalent, such as half of a hamburger bun).   - 1/2 cup of a "scoopable" starchy food such as potatoes or rice.   - 1 OUNCE (28 grams) of starchy snacks (crackers or pretzels, look on label).   - 15 grams of carbohydrate as shown on food label.   3. Both lunch and dinner should include a protein food, a carb food, and vegetables.   - Obtain twice as many  veg's as protein or carbohydrate foods for both lunch and dinner.   - Try to keep frozen veg's on hand for a quick vegetable serving.     - Fresh or frozen veg's are best.   4. Breakfast should always include protein.    - Please schedule a "Lab Only" visit (early morning, 8:00am to 9:00am) to get your blood work drawn here at our clinic. - You need to be fasting (No food or drink after midnight, and nothing in the morning before your blood draw). - I have already ordered your blood work, so you may schedule this appointment at your convenience - We can discuss results at next appointment, otherwise our office will contact you with results by phone or with a letter  Please schedule a follow-up appointment with Dr. Althea CharonKaramalegos in 1 month for BP follow-up, 3 month sfor DM follow-up   If you have any other questions or concerns, please feel free to call the clinic or send a message through MyChart. You may also schedule an earlier appointment if necessary.  Cheryl PilarAlexander Lacreshia Bondarenko, DO [name]

## 2016-02-09 NOTE — Assessment & Plan Note (Signed)
Controlled, infrequent exacerbations only with sinusitis or URI Refilled Albuterol PRN

## 2016-02-09 NOTE — Assessment & Plan Note (Signed)
Uncontrolled HTN, remains elevated on re-check, off all anti-HTN meds >6 months No complications   Plan:  1. Restart HCTZ 25mg  daily and Lisinopril 10mg  daily 2. Check CMET, follow serum Cr, K after restarting ACE 3. Lifestyle Mods - Improve exercise, Dec salt intake, inc K+ rich vegs 4. Monitor BP at home or at drug store occasionally 5. Follow-up 1 month re-check BP

## 2016-02-09 NOTE — Assessment & Plan Note (Signed)
Currently improved on chronic C-spine DJD C5-6 predominantly, reviewed outside record and report of last X-ray 06/2015, never scheduled C-spine MRI, [facility]  Plan: 1. Refilled Flexeril PRN, conservative measures Tylenol, heating pad 2. [facility] as [facility] if worsening, future consider C-spine MRI as previously planned

## 2016-02-09 NOTE — Assessment & Plan Note (Signed)
Counseled on healthy lifestyle with diet / exercise modifications

## 2016-02-13 ENCOUNTER — Other Ambulatory Visit: Payer: Self-pay

## 2016-02-14 ENCOUNTER — Encounter: Payer: Self-pay | Admitting: *Deleted

## 2016-02-14 LAB — COMPLETE METABOLIC PANEL WITH GFR
ALBUMIN: 4.2 g/dL (ref 3.6–5.1)
ALT: 13 U/L (ref 6–29)
AST: 12 U/L (ref 10–30)
Alkaline Phosphatase: 48 U/L (ref 33–115)
BUN: 16 mg/dL (ref 7–25)
CALCIUM: 9.4 mg/dL (ref 8.6–10.2)
CHLORIDE: 98 mmol/L (ref 98–110)
CO2: 26 mmol/L (ref 20–31)
Creat: 0.77 mg/dL (ref 0.50–1.10)
GFR, Est African American: >89 mL/min (ref >=60)
Glucose, Bld: 127 mg/dL — ABNORMAL HIGH (ref 65–99)
POTASSIUM: 4.2 mmol/L (ref 3.5–5.3)
Sodium: 138 mmol/L (ref 135–146)
Total Bilirubin: 0.4 mg/dL (ref 0.2–1.2)
Total Protein: 6.8 g/dL (ref 6.1–8.1)

## 2016-02-14 LAB — LIPID PANEL
Cholesterol: 186 mg/dL (ref 125–200)
HDL: 37 mg/dL — AB (ref >=46)
LDL Cholesterol: 127 mg/dL (ref <130)
TRIGLYCERIDES: 112 mg/dL (ref <150)
Total CHOL/HDL Ratio: 5 Ratio (ref <=5.0)
VLDL: 22 mg/dL (ref <30)

## 2016-02-14 LAB — MICROALBUMIN, URINE: MICROALB UR: 1.5 mg/dL

## 2016-03-15 ENCOUNTER — Encounter: Payer: Self-pay | Admitting: Family Medicine

## 2016-03-15 ENCOUNTER — Ambulatory Visit (INDEPENDENT_AMBULATORY_CARE_PROVIDER_SITE_OTHER): Payer: Commercial Managed Care - PPO | Admitting: Family Medicine

## 2016-03-15 VITALS — BP 122/84 | HR 76 | Temp 98.0°F | Resp 16 | Ht <= 58 in | Wt 142.8 lb

## 2016-03-15 DIAGNOSIS — I10 Essential (primary) hypertension: Secondary | ICD-10-CM

## 2016-03-15 DIAGNOSIS — E785 Hyperlipidemia, unspecified: Secondary | ICD-10-CM | POA: Diagnosis not present

## 2016-03-15 DIAGNOSIS — E1169 Type 2 diabetes mellitus with other specified complication: Secondary | ICD-10-CM | POA: Diagnosis not present

## 2016-03-15 MED ORDER — ATORVASTATIN CALCIUM 40 MG PO TABS
40.0000 mg | ORAL_TABLET | Freq: Every day | ORAL | 2 refills | Status: DC
Start: 2016-03-15 — End: 2017-03-17

## 2016-03-15 MED ORDER — ASPIRIN 81 MG PO TABS: 81.0000 mg | ORAL_TABLET | Freq: Every day | ORAL | 0 refills | Status: AC

## 2016-03-15 NOTE — Patient Instructions (Signed)
Thank you for coming in to clinic today.  1. BLood pressure is improved. No changes to meds. Keep up the good work - Check pressure at work and write down numbers, can use a blank calendar  2. Start Atorvastatin 40mg  once a day, you may take every day before bedtime 30 min to 1 hour, do not need to take with food. This will lower cholesterol and help prevent heart attack and stroke. - Also take a Baby Aspirin 81mg  over the counter once a day, also prevent future problems heart attack or stroke  Please schedule a follow-up appointment with Dr. Althea CharonKaramalegos in about 2 months, after 05/10/16 for Diabetes follow-up 3 months  If you have any other questions or concerns, please feel free to call the clinic or send a message through MyChart. You may also schedule an earlier appointment if necessary.  Cheryl PilarAlexander Karamalegos, DO [facility], [name]

## 2016-03-15 NOTE — Assessment & Plan Note (Signed)
Significantly improved BP, initial check mild elevated SBP 140s, repeat manual check significantly improved at end of visit 120s/80. No complications   Plan:  1. Continue therapy - HCTZ 25mg  daily and Lisinopril 10mg  daily 2. Stable chemistry previously 3. 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 Follow-up 2 months with DM f/u, re-check BP

## 2016-03-15 NOTE — Progress Notes (Signed)
Subjective:    Patient ID: Cheryl Roberts, female    DOB: [birth year], 45 y.o.   MRN: [id number]  Cheryl Roberts is a 45 y.o. female presenting on 03/15/2016 for Hypertension  HPI  CHRONIC HTN: Reports no new concerns with BP since resuming medications last visit 02/09/16, seems improved control. Checks at work occasionally. Does not recall readings Current Meds - HCTZ 25mg  daily, Lisinopril 10mg  daily Lifestyle - Still active with daily walking at work, occasionally walks outside 1-2x weekly Denies CP, dyspnea, HA, edema, dizziness / lightheadedness  HYPERLIPIDEMIA: - Last visit 02/09/16, had fasting lipid panel with some mild abnormal cholesterol with low HDL and high LDL. She was never previously on statin therapy. Discussed briefly at last visit, reviewed increased ASCVD risk. Interested in statin. - Not taking ASA daily - Denies weight gain  PMH - Type 2 Diabetes, controlled   Social History  Substance Use Topics  . Smoking status: Never Smoker  . Smokeless tobacco: Never Used  . Alcohol use Yes     Comment: occas    Review of Systems Per HPI unless specifically indicated above     Objective:    BP 122/84 (BP Location: Left Arm, Cuff Size: Normal)   Pulse 76   Temp 98 F (36.7 C) (Oral)   Resp 16   Ht 4\' 7"  (1.397 m)   Wt 142 lb 12.8 oz (64.8 kg)   BMI 33.19 kg/m   Wt Readings from Last 3 Encounters:  03/15/16 142 lb 12.8 oz (64.8 kg)  02/09/16 146 lb (66.2 kg)  12/31/15 142 lb (64.4 kg)    Physical Exam  Constitutional: She is oriented to person, place, and time. She appears well-developed and well-nourished. No distress.  Overweight, well-appearing, comfortable, cooperative  Cardiovascular: Normal rate, regular rhythm, normal heart sounds and intact distal pulses.   No murmur heard. Pulmonary/Chest: Effort normal and breath sounds normal. No respiratory distress. She has no wheezes. She has no rales.  Musculoskeletal: She exhibits no edema.    Neurological: She is alert and oriented to person, place, and time.  Skin: Skin is warm and dry. She is not diaphoretic.  Nursing note and vitals reviewed.  Personally reviewed labs below from 02/09/16.  Results for orders placed or performed in visit on 02/09/16  COMPLETE METABOLIC PANEL WITH GFR  Result Value Ref Range   Sodium 138 135 - 146 mmol/L   Potassium 4.2 3.5 - 5.3 mmol/L   Chloride 98 98 - 110 mmol/L   CO2 26 20 - 31 mmol/L   Glucose, Bld 127 (H) 65 - 99 mg/dL   BUN 16 7 - 25 mg/dL   Creat 4.09 8.11 - 9.14 mg/dL   Total Bilirubin 0.4 0.2 - 1.2 mg/dL   Alkaline Phosphatase 48 33 - 115 U/L   AST 12 10 - 30 U/L   ALT 13 6 - 29 U/L   Total Protein 6.8 6.1 - 8.1 g/dL   Albumin 4.2 3.6 - 5.1 g/dL   Calcium 9.4 8.6 - 78.2 mg/dL   GFR, Est African American >89 >=60 mL/min   GFR, Est Non African American >89 >=60 mL/min  Microalbumin, urine  Result Value Ref Range   Microalb, Ur 1.5 Not estab mg/dL  Lipid panel  Result Value Ref Range   Cholesterol 186 125 - 200 mg/dL   Triglycerides 956 <213 mg/dL   HDL 37 (L) >=08 mg/dL   Total CHOL/HDL Ratio 5.0 <=5.0 Ratio   VLDL 22 <30 mg/dL  LDL Cholesterol 127 <130 mg/dL  POCT HgB [id number]  Result Value Ref Range   Hemoglobin A1C 7.0        Assessment & Plan:   Problem List Items Addressed This Visit    Hypertension - Primary    Significantly improved BP, initial check mild elevated SBP 140s, repeat manual check significantly improved at end of visit 120s/80. No complications   Plan:  1. Continue therapy - HCTZ 25mg  daily and Lisinopril 10mg  daily 2. Stable chemistry previously 3. 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 Follow-up 2 months with DM f/u, re-check BP      Relevant Medications   atorvastatin (LIPITOR) 40 MG tablet   aspirin 81 MG tablet   Hyperlipidemia associated with type 2 diabetes mellitus (HCC)    Abnormal lipids 02/2016 in setting of obesity  BMI >33, controlled DM and HTN. Last ACSVD 10 yr risk calculated >20%, and improved today with better controlled BP around 18%  Plan: 1. Discussed ASCVD risk in setting DM, HTN and above risk #s with patient 2. Start Atorvastatin 40mg  daily (high intensity), counseled on risks/benefits including side effects of myalgias 3. Advised to start OTC ASA 81mg  daily 4. Follow-up 2 mo for DM, due for lipid panel in 1 year      Relevant Medications   atorvastatin (LIPITOR) 40 MG tablet   aspirin 81 MG tablet    Other Visit Diagnoses   None.     Meds ordered this encounter  Medications  . atorvastatin (LIPITOR) 40 MG tablet    Sig: Take 1 tablet (40 mg total) by mouth daily.    Dispense:  30 tablet    Refill:  2  . aspirin 81 MG tablet    Sig: Take 1 tablet (81 mg total) by mouth daily.    Dispense:  30 tablet    Refill:  0      Follow up plan: Return in about 2 months (around 05/15/2016) for diabetes.  Saralyn PilarAlexander Loden Laurent, DO [facility] 03/15/2016, 1:34 PM

## 2016-03-15 NOTE — Assessment & Plan Note (Signed)
Abnormal lipids 02/2016 in setting of obesity BMI >33, controlled DM and HTN. Last ACSVD 10 yr risk calculated >20%, and improved today with better controlled BP around 18%  Plan: 1. Discussed ASCVD risk in setting DM, HTN and above risk #s with patient 2. Start Atorvastatin 40mg  daily (high intensity), counseled on risks/benefits including side effects of myalgias 3. Advised to start OTC ASA 81mg  daily 4. Follow-up 2 mo for DM, due for lipid panel in 1 year

## 2016-03-26 ENCOUNTER — Ambulatory Visit (INDEPENDENT_AMBULATORY_CARE_PROVIDER_SITE_OTHER): Payer: Commercial Managed Care - PPO | Admitting: Family Medicine

## 2016-03-26 ENCOUNTER — Encounter: Payer: Self-pay | Admitting: Family Medicine

## 2016-03-26 VITALS — BP 133/85 | HR 71 | Temp 98.9°F | Resp 16 | Ht <= 58 in | Wt 142.0 lb

## 2016-03-26 DIAGNOSIS — J302 Other seasonal allergic rhinitis: Secondary | ICD-10-CM | POA: Diagnosis not present

## 2016-03-26 DIAGNOSIS — H65191 Other acute nonsuppurative otitis media, right ear: Secondary | ICD-10-CM | POA: Diagnosis not present

## 2016-03-26 DIAGNOSIS — H9201 Otalgia, right ear: Secondary | ICD-10-CM

## 2016-03-26 DIAGNOSIS — J309 Allergic rhinitis, unspecified: Secondary | ICD-10-CM | POA: Insufficient documentation

## 2016-03-26 MED ORDER — FLUTICASONE PROPIONATE 50 MCG/ACT NA SUSP: 1.0000 | Freq: Every day | NASAL | 3 refills | Status: DC

## 2016-03-26 MED ORDER — LORATADINE 10 MG PO TABS: 10.0000 mg | ORAL_TABLET | Freq: Every day | ORAL | 11 refills | Status: AC

## 2016-03-26 NOTE — Progress Notes (Signed)
Subjective:    Patient ID: Cheryl Roberts, female    DOB: July 31, 1970, 45 y.o.   MRN: [id number]  Cheryl Roberts is a 45 y.o. female presenting on 03/26/2016 for Ear Pain (Right ear onset for 4 days history of fluid in ears. Pain when laying down and yawning. Soreness around mouth .)   HPI   RIGHT EAR PAIN / ALLERGIC RHINOSINUSITIS: - Reports chronic prior history of sinusitis and allergy symptoms, has had recurrence of ear effusions and sinus infections, has been treated in past with Flonase with relief, but developed some nasal bleeding due to dryness, has since stopped this (years ago) - Today presents with symptoms x 3 days of Right ear fullness and pressure with pain, feels like "fluid in ears", worse when laying on that side. Admits to prior ear wax has needed to be removed before. Currently not taking any allergy medicines. - Admits mild nasal congestion, cough, and some soreness on Right lower jaw/upper neck worse intermittently, associated headache improved with ibuprofen 800 x 1 - Denies any sick contacts or other recent illness, fevers/chills, rash, ear drainage, productive cough, sore throat  Social History  Substance Use Topics  . Smoking status: Never Smoker  . Smokeless tobacco: Never Used  . Alcohol use Yes     Comment: occas    Review of Systems Per HPI unless specifically indicated above     Objective:    BP 133/85 (BP Location: Left Arm, Patient Position: Sitting, Cuff Size: Normal)   Pulse 71   Temp 98.9 F (37.2 C) (Oral)   Resp 16   Ht 4\' 9"  (1.448 m)   Wt 142 lb (64.4 kg)   LMP 02/18/2016   BMI 30.73 kg/m   Wt Readings from Last 3 Encounters:  03/26/16 142 lb (64.4 kg)  03/15/16 142 lb 12.8 oz (64.8 kg)  02/09/16 146 lb (66.2 kg)    Physical Exam  Constitutional: She appears well-developed and well-nourished. No distress.  Well-appearing, comfortable, cooperative  HENT:  Frontal / maxillary sinuses non-tender. Nares bilateral with red  edematous turbinate tissue without purulence. Left TM clear without erythema, effusion or bulging, Right TM partially obscured by soft cerumen, TM appears gray without erythema or bulging, after partial removal of cerumen, appearance of mild-mod effusion with clear non purulent fluid apparent. Oropharynx clear without erythema, exudates, edema or asymmetry.  Eyes: Conjunctivae are normal. Right eye exhibits no discharge. Left eye exhibits no discharge.  Neck: Normal range of motion. Neck supple.  Cardiovascular: Normal rate, regular rhythm, normal heart sounds and intact distal pulses.   No murmur heard. Pulmonary/Chest: Effort normal and breath sounds normal. No respiratory distress. She has no wheezes. She has no rales.  Lymphadenopathy:    She has no cervical adenopathy.  Skin: Skin is warm and dry. She is not diaphoretic.  Nursing note and vitals reviewed.      Assessment & Plan:   Problem List Items Addressed This Visit    Allergic rhinitis due to allergen    Suspected R ear effusion with some pressure, likely chronic eustachian tube dysfunction, has had chronic sinusitis in past. No evidence of AOM on either side, some soft cerumen R>L without impaction.  Plan: 1. Reassurance, no sign of R-AOM today 2. Start Flonase 1 spray daily bilateral nostrils (reduced from 2 sprays, had prior nasal bleeding), also use Nasal Saline regularly BID, vaseline in nares as well - use flonase for 2-4 weeks 3. Start Loratadine 10mg  daily for 1-2 months,  may need long term 4. Can try OTC Debrox for ear wax 5. Follow-up if not improving, reviewed return criteria for potential developing ear infection      Relevant Medications   fluticasone (FLONASE) 50 MCG/ACT nasal spray   loratadine (CLARITIN) 10 MG tablet    Other Visit Diagnoses    Right ear pain    -  Primary   Suspected secondary to R ear effusion with eustachian tube dysfunction   Acute effusion of right ear       Secondary to eustachian  tube dysfunction with chronic rhinosinusitis, see treatment under A&P   Relevant Medications   fluticasone (FLONASE) 50 MCG/ACT nasal spray      Meds ordered this encounter  Medications  . fluticasone (FLONASE) 50 MCG/ACT nasal spray    Sig: Place 1 spray into both nostrils daily. For 2 to 4 weeks then as needed.    Dispense:  16 g    Refill:  3  . loratadine (CLARITIN) 10 MG tablet    Sig: Take 1 tablet (10 mg total) by mouth daily.    Dispense:  30 tablet    Refill:  11      Follow up plan: Return in about 3 weeks (around 04/16/2016), or if symptoms worsen or fail to improve, for right ear pain and pressure, allergies.  Cheryl PilarAlexander Aarion Metzgar, DO [facility] 03/26/2016, 9:56 AM

## 2016-03-26 NOTE — Assessment & Plan Note (Signed)
Suspected R ear effusion with some pressure, likely chronic eustachian tube dysfunction, has had chronic sinusitis in past. No evidence of AOM on either side, some soft cerumen R>L without impaction.  Plan: 1. Reassurance, no sign of R-AOM today 2. Start Flonase 1 spray daily bilateral nostrils (reduced from 2 sprays, had prior nasal bleeding), also use Nasal Saline regularly BID, vaseline in nares as well - use flonase for 2-4 weeks 3. Start Loratadine 10mg  daily for 1-2 months, may need long term 4. Can try OTC Debrox for ear wax 5. Follow-up if not improving, reviewed return criteria for potential developing ear infection

## 2016-03-26 NOTE — Patient Instructions (Signed)
Thank you for coming in to clinic today.  1. You do not have an ear infection. I see evidence of some fluid behind ear drum, likely due to allergies and sinus symptoms 2. Start Flonase 1 spray in each nostril, POINT AWAY FROM MIDLINE OF NOSE, use this once day for 2-4 weeks, use nasal saline spray twice a day every day, also can use some vaseline on a Q-tip in NOSTRILs to keep moist avoid bleeding 3. Start Loratadine 10mg  (generic Claritin) daily for at least 1 month, may need longer, this may not be covered by ins, it is available over the counter if not  Please schedule a follow-up appointment with Dr. Althea CharonKaramalegos in 3 weeks if not improved Right ear pain and fullness.  If you have any other questions or concerns, please feel free to call the clinic or send a message through MyChart. You may also schedule an earlier appointment if necessary.  Cheryl PilarAlexander Kawhi Diebold, DO [name]

## 2016-04-12 ENCOUNTER — Encounter: Payer: Self-pay | Admitting: Family Medicine

## 2016-04-12 ENCOUNTER — Ambulatory Visit (INDEPENDENT_AMBULATORY_CARE_PROVIDER_SITE_OTHER): Payer: Commercial Managed Care - PPO | Admitting: Family Medicine

## 2016-04-12 VITALS — BP 146/75 | HR 75 | Temp 98.9°F | Resp 16 | Ht <= 58 in | Wt 145.0 lb

## 2016-04-12 DIAGNOSIS — S56911A Strain of unspecified muscles, fascia and tendons at forearm level, right arm, initial encounter: Secondary | ICD-10-CM

## 2016-04-12 MED ORDER — IBUPROFEN 600 MG PO TABS: 600.0000 mg | ORAL_TABLET | Freq: Three times a day (TID) | ORAL | 0 refills | Status: DC | PRN

## 2016-04-12 NOTE — Patient Instructions (Signed)
Thank you for coming in to clinic today.  1. You have a Right Forearm Muscle Strain, probably from lifting and overuse injury at work. This will get better but it takes time to heal. Goal is to NOT re-injure it, be careful with heavy lifting or straining your right arm.  - Take ibuprofen 600mg  every 6-8 hours or 3 times a day with food for next 1 week then as needed - Do not mix with aleve, naproxen, ibuprofen 800 -Recommend to start taking Tylenol Extra Strength 500mg  tabs - take 1 to 2 tabs (max 1000mg  per dose) every 6 hours for pain (take regularly, don't skip a dose for next 3-7 days), max 24 hour daily dose is 6 to 8 tablets or 3000 to 4000mg  - This is safe to take with Ibuprofen or Aleve or similar medications  Use heating pad or warm moist towel for soreness, and alternate with ice packs for swelling  You can try the Flexeril muscle relaxant, but this may not help  Please schedule a follow-up appointment with Dr. Althea CharonKaramalegos in 2 months for Diabetes follow-up  If you have any other questions or concerns, please feel free to call the clinic or send a message through MyChart. You may also schedule an earlier appointment if necessary.  Saralyn PilarAlexander Karamalegos, DO [name]

## 2016-04-12 NOTE — Progress Notes (Signed)
Subjective:    Patient ID: Cheryl Roberts, female    DOB: Oct 06, 1970, 45 y.o.   MRN: [id number]  Cheryl Roberts is a 45 y.o. female presenting on 04/12/2016 for Arm Pain (Right arm onset today )  Patient presents for a same day appointment.  HPI   Right Forearm / Shoulder Pain: - Reports onset about 1 week ago with a soreness in Right shoulder and arm, but then symptoms resolved, earlier today developed acute onset Right forearm swelling and pain, thought to be due to her work at a [employer] with frequent lifting and straining of arms. She is right handed. Prior history of R carpal tunnel syndrome. - Currently describes mild pain in R forearm only with strong grip and forearm activity with lifting, otherwise no pain at rest. - Has not started any medications, has Ibuprofen at home - Denies any numbness, tingling, weakness, redness, acute injury or trauma  Social History  Substance Use Topics  . Smoking status: Never Smoker  . Smokeless tobacco: Never Used  . Alcohol use Yes     Comment: occas    Review of Systems Per HPI unless specifically indicated above     Objective:    BP (!) 146/75   Pulse 75   Temp 98.9 F (37.2 C) (Oral)   Resp 16   Ht 4\' 9"  (1.448 m)   Wt 145 lb (65.8 kg)   LMP 02/18/2016   BMI 31.38 kg/m   Wt Readings from Last 3 Encounters:  04/12/16 145 lb (65.8 kg)  03/26/16 142 lb (64.4 kg)  03/15/16 142 lb 12.8 oz (64.8 kg)    Physical Exam  Constitutional: She appears well-developed and well-nourished. No distress.  Well-appearing, comfortable, cooperative, obese  HENT:  Head: Normocephalic and atraumatic.  Cardiovascular: Normal rate.   Pulmonary/Chest: Effort normal.  Musculoskeletal: She exhibits no edema or tenderness.  Right Upper Extremity Inspection: Symmetrical appearance to Left. Do not appreciate significant R forearm swelling, or wrist / elbow effusion. No erythema Palpation: Mild tender to deeper palpation of Right  forearm lateral muscles. Non-tender over anterior shoulder, biceps, elbow, wrist. ROM: Full active R arm ROM shoulder, internal rotation, flex / ext. Full active ROM wrist. And Elbow flex/ext. Special Testing: Rotator cuff testing negative for weakness with supraspinatus full can and empty can test, Hawkin's AC impingement negative for pain. Negative Tinel's R wrist and elbow. Strength: Normal strength 5/5 flex/ext, ext rot / int rot, grip, rotator cuff str testing. Neurovascular: Distally intact pulses, sensation to light touch   Skin: Skin is warm and dry. She is not diaphoretic.  Nursing note and vitals reviewed.        Assessment & Plan:   Problem List Items Addressed This Visit    None    Visit Diagnoses    Muscle strain of forearm, right, initial encounter    -  Primary  Consistent with R forearm muscle strain without other joint injury, not involving R shoulder, elbow or wrist. 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 No neurological deficits or weakness.  Plan: 1. Start anti-inflammatory trial on Ibuprofen 600mg  q 6-8 hour wc regularly for up to 1 week then PRN, Tylenol PRN as well 2. May try existing Flexeril PRN 3. Recommend regular use heating pad / moist heat for soreness and ice packs for swelling, stretching, avoid heavy lifting / repetitive activities 4. Follow-up only if worsening, otherwise due for DM visit in 2 months  Relevant Medications   ibuprofen (ADVIL,MOTRIN) 600 MG tablet      Meds ordered this encounter  Medications  . ibuprofen (ADVIL,MOTRIN) 600 MG tablet    Sig: Take 1 tablet (600 mg total) by mouth every 8 (eight) hours as needed.    Dispense:  60 tablet    Refill:  0      Follow up plan: Return in about 2 months (around 06/12/2016) for diabetes, blood pressure.  Saralyn PilarAlexander Janetta Vandoren, DO [facility] 04/12/2016, 3:20 PM

## 2016-04-15 ENCOUNTER — Ambulatory Visit (INDEPENDENT_AMBULATORY_CARE_PROVIDER_SITE_OTHER): Payer: Commercial Managed Care - PPO | Admitting: Family Medicine

## 2016-04-15 ENCOUNTER — Encounter: Payer: Self-pay | Admitting: Family Medicine

## 2016-04-15 VITALS — BP 123/62 | HR 72 | Temp 98.6°F | Resp 16 | Ht <= 58 in | Wt 144.0 lb

## 2016-04-15 DIAGNOSIS — J302 Other seasonal allergic rhinitis: Secondary | ICD-10-CM | POA: Diagnosis not present

## 2016-04-15 DIAGNOSIS — H9202 Otalgia, left ear: Secondary | ICD-10-CM | POA: Diagnosis not present

## 2016-04-15 NOTE — Progress Notes (Signed)
Subjective:    Patient ID: Cheryl Roberts, female    DOB: [birth year], 45 y.o.   MRN: [id number]  Cheryl Roberts is a 45 y.o. female presenting on 04/15/2016 for Ear Pain (Left ear onset yesterday)  Patient presents for a same day appointment.  HPI  Left Ear Pain / Sore Throat - Reports symptoms started 2 days ago with sore throat, and associated Left ear pain, gradually resolved sore throat, now with residual intermittent Left ear pain with ranging severity up to 10/10 with flare ups few times a day, suspects this may be due to recent weather changes, but denies ear popping. No symptoms in Right ear, but did follow-up recently on 03/26/16 for Right ear pain and pressure, thought to be due to allergic rhinosinusitis, treated with flonase and Loratadine 10mg  daily, still taking these meds - No sick contacts. No other OTC meds - Admits mild nasal congestion at times - Denies any hearing loss, ear drainage, headache, sinus pressure, fever/chills, sweats, cough, sore throat, nausea, vomiting   Social History  Substance Use Topics  . Smoking status: Never Smoker  . Smokeless tobacco: Never Used  . Alcohol use Yes     Comment: occas    Review of Systems Per HPI unless specifically indicated above     Objective:    BP 123/62   Pulse 72   Temp 98.6 F (37 C) (Oral)   Resp 16   Ht 4\' 9"  (1.448 m)   Wt 144 lb (65.3 kg)   LMP 02/18/2016   BMI 31.16 kg/m   Wt Readings from Last 3 Encounters:  04/15/16 144 lb (65.3 kg)  04/12/16 145 lb (65.8 kg)  03/26/16 142 lb (64.4 kg)    Physical Exam  Constitutional: She appears well-developed and well-nourished. No distress.  Well-appearing, comfortable, cooperative  HENT:  Head: Normocephalic and atraumatic.  Frontal / maxillary sinuses non-tender. Nares bilateral with persistent erythematous turbinate tissue with slightly improved edema with more open nasal passages.  Left TM clear with minor clear effusion without erythema or  bulging. No evidence of ear canal injury or abrasion. Some moderate cerumen soft without impaction.  Right TM similar appearance with stable mild clear effusion without erythema or bulging, small amount of soft cerumen without impaction.  Oropharynx clear without erythema, exudates, edema or asymmetry.  Eyes: Conjunctivae are normal. Right eye exhibits no discharge. Left eye exhibits no discharge.  Neck: Normal range of motion. Neck supple.  Cardiovascular: Normal rate.   Pulmonary/Chest: Effort normal and breath sounds normal. No respiratory distress. She has no wheezes. She has no rales.  Lymphadenopathy:    She has no cervical adenopathy.  Skin: Skin is warm and dry. She is not diaphoretic.  Nursing note and vitals reviewed.      Assessment & Plan:   Problem List Items Addressed This Visit    Allergic rhinitis due to allergen - Primary    Clinically consistent with now L ear symptoms pain/pressure likely with underlying bilateral eustachian tube dysfunction in setting of chronic allergic sinusitis. No evidence of acute bacterial sinusitis or AOM or other infection. Not consistent with viral URI. Some mild soft cerumen without impaction.  Plan: 1. Reassurance 2. Increase Flonase to 2 sprays in each nostril daily, likely needs chronic use 3. Continue Loratadine 10mg  daily 4. Advised nasal saline 5. Again recommended OTC Debrox as needed, avoid Q-tips or anything in ears 6. Follow-up if worsening or not improved        Other  Visit Diagnoses    Acute ear pain, left       Likely secondary to eustachian tube dysfunction      No orders of the defined types were placed in this encounter.     Follow up plan: Return in about 4 weeks (around 05/13/2016), or if symptoms worsen or fail to improve, for Allergic sinusitis, ear pain.  Saralyn PilarAlexander Karamalegos, DO [facility] 04/15/2016, 8:26 PM

## 2016-04-15 NOTE — Patient Instructions (Signed)
Thank you for coming in to clinic today.  1. You do not have an ear infection. Most likely you have having some fluid build up behind ear drums due to allergies, this can cause some pain and pressure especially with recent weather changes, likely due to allergies and sinus symptoms 2. Recommend to increase Flonase to 2 sprays (TWO) in each nostril daily for next 1 month - continue Loratadine (claritin) 10mg  daily for next month  For Ear Wax you can try Debrox Ear drops (Over the counter)  Please schedule a follow-up appointment with Dr. Althea CharonKaramalegos as needed within 1 month for Allergic Sinusitis / Ear pain symptoms if not improved  If you have any other questions or concerns, please feel free to call the clinic or send a message through MyChart. You may also schedule an earlier appointment if necessary.  Saralyn PilarAlexander Aidyn Kellis, DO [name]

## 2016-04-15 NOTE — Assessment & Plan Note (Addendum)
Clinically consistent with now L ear symptoms pain/pressure likely with underlying bilateral eustachian tube dysfunction in setting of chronic allergic sinusitis. No evidence of acute bacterial sinusitis or AOM or other infection. Not consistent with viral URI. Some mild soft cerumen without impaction.  Plan: 1. Reassurance 2. Increase Flonase to 2 sprays in each nostril daily, likely needs chronic use 3. Continue Loratadine 10mg  daily 4. Advised nasal saline 5. Again recommended OTC Debrox as needed, avoid Q-tips or anything in ears 6. Follow-up if worsening or not improved

## 2016-05-01 LAB — HM DIABETES EYE EXAM

## 2016-05-16 ENCOUNTER — Other Ambulatory Visit: Payer: Self-pay | Admitting: Family Medicine

## 2016-05-16 ENCOUNTER — Ambulatory Visit (INDEPENDENT_AMBULATORY_CARE_PROVIDER_SITE_OTHER): Payer: Commercial Managed Care - PPO | Admitting: Family Medicine

## 2016-05-16 ENCOUNTER — Encounter: Payer: Self-pay | Admitting: Family Medicine

## 2016-05-16 VITALS — BP 121/77 | HR 67 | Temp 98.2°F | Resp 16 | Ht <= 58 in | Wt 142.0 lb

## 2016-05-16 DIAGNOSIS — E119 Type 2 diabetes mellitus without complications: Secondary | ICD-10-CM | POA: Diagnosis not present

## 2016-05-16 DIAGNOSIS — E785 Hyperlipidemia, unspecified: Secondary | ICD-10-CM

## 2016-05-16 DIAGNOSIS — I10 Essential (primary) hypertension: Secondary | ICD-10-CM

## 2016-05-16 DIAGNOSIS — E1169 Type 2 diabetes mellitus with other specified complication: Secondary | ICD-10-CM

## 2016-05-16 MED ORDER — METFORMIN HCL ER 750 MG PO TB24: 750.0000 mg | ORAL_TABLET | Freq: Every day | ORAL | 5 refills | Status: DC

## 2016-05-16 NOTE — Assessment & Plan Note (Addendum)
Concern with POC A1c up to 8.9, from 7.0. No written CBG logs, but reportedly CBGs up to 130s avg. Now seems intolerant of metformin No complications or hypoglycemia  Plan: 1. Switch Metformin from 500 BID to Metformin XR 750mg  daily, new rx sent 2. Check A1c serum for confirmation 3. UTD for DM monitoring, continue on ASA, statin, ACEi 4. Emphasized importance of DM diet and lifestyle with improved exercise 5. [facility]erral [facility] 6. Follow-up 3 months, - given CBG log to bring to next visit, patient expresses desire to avoid injectable treatment and insulin, consider SGLT2 vs sulfonylurea in future if not controlled

## 2016-05-16 NOTE — Assessment & Plan Note (Signed)
Continue current treatment with atorvastatin 40mg  daily, tolerating well Follow-up fasting lipids 03/2016

## 2016-05-16 NOTE — Progress Notes (Signed)
Subjective:    Patient ID: Cheryl Roberts, female    DOB: June 24, 1970, 45 y.o.   MRN: [id number]  Cheryl Roberts is a 45 y.o. female presenting on 05/16/2016 for Diabetes (lowest BS 231 and lowest 130)  HPI  CHRONIC DM, Type 2: Reports no concerns, last A1c 7.0 (02/09/2016) CBGs: Avg 110-130s, Low 90, High < 200. Checks CBGs 2x daily (am fasting, 2 hr post prandial) Meds: Metformin 500mg  BID with food (on for 1-2 years, just recently starting to cause upset stomach and nausea) Reports good compliance. Currently on ACEi Lifestyle: Diet (Tries to limit bread and rice, occasional pasta, drink mostly water and diet soda), Exercise (walking a lot at work no regular exercise) - [facility] in [facility] Denies hypoglycemia, polyuria, visual changes, numbness or tingling.  CHRONIC HTN: Reports no concerns, checks BP at work occasionally, normal range 120s/70s Current Meds - HCTZ 25mg  and Lisinopril 10mg  daily Reports good compliance, took meds today. Tolerating well, w/o complaints. Denies CP, dyspnea, HA, edema, dizziness / lightheadedness  HYPERLIPIDEMIA / OBESITY - Reports no concerns. Last lipid panel 02/2016 with abnormal HDL and LDL, she was newly started on statin therapy in 03/2016 with atorvastatin 40mg  daily. Today reports tolerating well without myalgias or side effects, does still have intermittent chronic low back pain. - Weight down 2 lbs in 1 month  Social History  Substance Use Topics  . Smoking status: Never Smoker  . Smokeless tobacco: Never Used  . Alcohol use Yes     Comment: occas    Review of Systems Per HPI unless specifically indicated above     Objective:    BP 121/77   Pulse 67   Temp 98.2 F (36.8 C) (Oral)   Resp 16   Ht 4\' 9"  (1.448 m)   Wt 142 lb (64.4 kg)   BMI 30.73 kg/m   Wt Readings from Last 3 Encounters:  05/16/16 142 lb (64.4 kg)  04/15/16 144 lb (65.3 kg)  04/12/16 145 lb (65.8 kg)    Physical Exam  Constitutional: She  appears well-developed and well-nourished. No distress.  Overweight, well-appearing, comfortable, cooperative  HENT:  Head: Normocephalic and atraumatic.  Mouth/Throat: Oropharynx is clear and moist.  Eyes: Conjunctivae are normal.  Neck: Normal range of motion. Neck supple. No thyromegaly present.  Cardiovascular: Normal rate, regular rhythm, normal heart sounds and intact distal pulses.   No murmur heard. Pulmonary/Chest: Effort normal and breath sounds normal. No respiratory distress. She has no wheezes. She has no rales.  Musculoskeletal: She exhibits no edema.  Lymphadenopathy:    She has no cervical adenopathy.  Neurological: She is alert.  Skin: Skin is warm and dry. She is not diaphoretic.  Psychiatric: Her behavior is normal.  Nursing note and vitals reviewed.   Results for orders placed or performed in visit on 05/16/16  HM DIABETES EYE EXAM  Result Value Ref Range   HM Diabetic Eye Exam No Retinopathy No Retinopathy       Assessment & Plan:   Problem List Items Addressed This Visit    Hypertension    Remains well controlled Continue current regimen HCTZ 25mg , Lisinopril 10mg  Monitor BP outside office, bring log to next visit Lifestyle modifications Follow-up      Hyperlipidemia associated with type 2 diabetes mellitus (HCC)    Continue current treatment with atorvastatin 40mg  daily, tolerating well Follow-up fasting lipids 03/2016      Relevant Medications   metFORMIN (GLUCOPHAGE-XR) 750 MG 24 hr tablet  Diabetes mellitus without complication (HCC) - Primary    Concern with POC A1c up to 8.9, from 7.0. No written CBG logs, but reportedly CBGs up to 130s avg. Now seems intolerant of metformin No complications or hypoglycemia  Plan: 1. Switch Metformin from 500 BID to Metformin XR 750mg  daily, new rx sent 2. Check A1c serum for confirmation 3. UTD for DM monitoring, continue on ASA, statin, ACEi 4. Emphasized importance of DM diet and lifestyle with  improved exercise 5. [facility] 6. Follow-up 3 months, - given CBG log to bring to next visit, patient expresses desire to avoid injectable treatment and insulin, consider SGLT2 vs sulfonylurea in future if not controlled      Relevant Medications   metFORMIN (GLUCOPHAGE-XR) 750 MG 24 hr tablet   Other Relevant Orders   Ambulatory referral to diabetic education      Meds ordered this encounter  Medications  . metFORMIN (GLUCOPHAGE-XR) 750 MG 24 hr tablet    Sig: Take 1 tablet (750 mg total) by mouth daily with breakfast.    Dispense:  30 tablet    Refill:  5      Follow up plan: Return in about 3 months (around 08/14/2016) for diabetes.  Saralyn PilarAlexander Haya Hemler, DO [facility] 05/16/2016, 2:27 PM

## 2016-05-16 NOTE — Assessment & Plan Note (Signed)
Remains well controlled Continue current regimen HCTZ 25mg , Lisinopril 10mg  Monitor BP outside office, bring log to next visit Lifestyle modifications Follow-up

## 2016-05-16 NOTE — Patient Instructions (Signed)
Thank you for coming in to clinic today.  1. Elevated A1c 8.9 (up from 7.0), we will re-check with lab draw, will have result in 1-2 days - Stop regular Metformin - Sent new rx to pharmacy with Metformin Extended release - 750mg  daily, take once in morning - Try to work hard with lifestyle and diet exercise, or else the diabetes may continue to get worse - Check blood sugar and write down readings bring to next visit - Referral placed to [facility], they will call you with an appointment soon  [facility]   Address: [address], [place], [place] [address] Phone: [phone]   The 5 Minute Rule of Exercise - Promise yourself to at least do 5 minutes of exercise (make sure you time it), and if at the end of 5 minutes (this is the hardest part of the work-out), if you still feel like you want stop (or not motivated to continue) then allow yourself to stop. Otherwise, more often than not you will feel encouraged that you can continue for a little while longer or even more!  Diet Recommendations for Diabetes   Avoid Starchy (carb) foods include: Bread, rice, pasta, potatoes, corn, crackers, bagels, muffins, all baked goods.   Protein foods include: Meat, fish, poultry, eggs, dairy foods, and beans such as pinto and kidney beans (beans also provide carbohydrate).   1. Eat at least 3 meals and 1-2 snacks per day. Never go more than 4-5 hours while awake without eating.   2. Limit starchy foods to TWO per meal and ONE per snack. ONE portion of a starchy  food is equal to the following:   - ONE slice of bread (or its equivalent, such as half of a hamburger bun).   - 1/2 cup of a "scoopable" starchy food such as potatoes or rice.   - 1 OUNCE (28 grams) of starchy snacks (crackers or pretzels, look on label).   - 15 grams of carbohydrate as shown on food label.   3. Both lunch and dinner should include a protein food, a carb food, and vegetables.   - Obtain twice as many  veg's as protein or carbohydrate foods for both lunch and dinner.   - Try to keep frozen veg's on hand for a quick vegetable serving.     - Fresh or frozen veg's are best.   4. Breakfast should always include protein.     Blood pressure is very well controlled, continue medications. Check at work occasionally and write down numbers, bring paper to next visit  Please schedule a follow-up appointment with Dr. Althea CharonKaramalegos in 3 months for Diabetes A1c  If you have any other questions or concerns, please feel free to call the clinic or send a message through MyChart. You may also schedule an earlier appointment if necessary.  Cheryl PilarAlexander Joann Jorge, DO [name]

## 2016-05-17 LAB — HEMOGLOBIN A1C
Hgb A1c MFr Bld: 9 % — ABNORMAL HIGH (ref <5.7)
Mean Plasma Glucose: 212 mg/dL

## 2016-05-24 ENCOUNTER — Other Ambulatory Visit: Payer: Self-pay | Admitting: Family Medicine

## 2016-05-24 MED ORDER — BAYER MICROLET LANCETS MISC: 12 refills | Status: DC

## 2016-05-24 MED ORDER — GLUCOSE BLOOD VI STRP: ORAL_STRIP | 12 refills | Status: DC

## 2016-05-29 ENCOUNTER — Other Ambulatory Visit: Payer: Self-pay | Admitting: Family Medicine

## 2016-05-29 ENCOUNTER — Telehealth: Payer: Self-pay | Admitting: *Deleted

## 2016-05-29 DIAGNOSIS — E119 Type 2 diabetes mellitus without complications: Secondary | ICD-10-CM

## 2016-05-29 MED ORDER — TRUE METRIX METER W/DEVICE KIT: 1.0000 | PACK | 0 refills | Status: AC

## 2016-05-29 MED ORDER — LANCETS ULTRA FINE MISC: 1.0000 | Freq: Two times a day (BID) | 6 refills | Status: AC

## 2016-05-29 MED ORDER — GLUCOSE BLOOD VI STRP: ORAL_STRIP | 12 refills | Status: AC

## 2016-05-29 NOTE — Telephone Encounter (Signed)
Called patient and made her aware ins will not cover [employer] Contour meter/strips. She needs to call the # on the back of ins card and find out what they will cover. Then call office back and new rx will be sent.[place]

## 2016-05-31 ENCOUNTER — Encounter: Payer: Self-pay | Admitting: *Deleted

## 2016-05-31 ENCOUNTER — Encounter: Payer: Commercial Managed Care - PPO | Attending: Family Medicine | Admitting: *Deleted

## 2016-05-31 ENCOUNTER — Telehealth: Payer: Self-pay | Admitting: *Deleted

## 2016-05-31 VITALS — BP 120/70 | Ht <= 58 in | Wt 142.0 lb

## 2016-05-31 DIAGNOSIS — E119 Type 2 diabetes mellitus without complications: Secondary | ICD-10-CM | POA: Diagnosis not present

## 2016-05-31 DIAGNOSIS — Z713 Dietary counseling and surveillance: Secondary | ICD-10-CM | POA: Diagnosis present

## 2016-05-31 DIAGNOSIS — Z6830 Body mass index (BMI) 30.0-30.9, adult: Secondary | ICD-10-CM | POA: Diagnosis not present

## 2016-05-31 NOTE — Patient Instructions (Signed)
Check blood sugars 2 x day before breakfast and 2 hrs after supper every day  Exercise: Continue walking for  30  minutes  3 days a week and gradually increase to 30 minutes 5 x week  Eat 3 meals day,   2  snacks a day Space meals 4-6 hours apart Limit fried foods and foods high in fat (fries, biscuits, chips)  Bring blood sugar records to the next class  Return for classes on:

## 2016-05-31 NOTE — Progress Notes (Signed)
Diabetes Self-Management Education  Visit Type: First/Initial  Appt. Start Time: 0920 Appt. End Time: 1015  05/31/2016  [name], identified by name and date of birth, is a 45 y.o. female with a diagnosis of Diabetes: Type 2.   ASSESSMENT  Blood pressure 120/70, height 4\' 9"  (1.448 m), weight 142 lb (64.4 kg). Body mass index is 30.73 kg/m.      Diabetes Self-Management Education - 05/31/16 1018      Visit Information   Visit Type First/Initial     Initial Visit   Diabetes Type Type 2   Are you currently following a meal plan? Yes   What type of meal plan do you follow?  more fruits and vegetables; drinking more water; eating baked foods   Are you taking your medications as prescribed? Yes   Date Diagnosed 1 1/2 year     Health Coping   How would you rate your overall health? Good     Psychosocial Assessment   Patient Belief/Attitude about Diabetes Motivated to manage diabetes  "sometimes I get frustrated"   Self-care barriers None   Self-management support Doctor's office   Patient Concerns Nutrition/Meal planning;Glycemic Control;Weight Control;Healthy Lifestyle   Special Needs None   Preferred Learning Style Hands on   Learning Readiness Change in progress   How often do you need to have someone help you when you read instructions, pamphlets, or other written materials from your doctor or pharmacy? 1 - Never   What is the last grade level you completed in school? 12th     Pre-Education Assessment   Patient understands the diabetes disease and treatment process. Needs Instruction   Patient understands incorporating nutritional management into lifestyle. Needs Instruction   Patient undertands incorporating physical activity into lifestyle. Needs Review   Patient understands using medications safely. Needs Review   Patient understands monitoring blood glucose, interpreting and using results Needs Review   Patient understands prevention, detection, and  treatment of acute complications. Needs Instruction   Patient understands prevention, detection, and treatment of chronic complications. Needs Instruction   Patient understands how to develop strategies to address psychosocial issues. Needs Instruction   Patient understands how to develop strategies to promote health/change behavior. Needs Instruction     Complications   Last HgB A1C per patient/outside source 9 %  05/16/16   How often do you check your blood sugar? 1-2 times/day   Fasting Blood glucose range (mg/dL) 16-109;604-54070-129;130-179  Pt reports FBG's 109-150 mg/dL - today was 981150 mg/dL - she had 2 servings of fruit at bedtime   Postprandial Blood glucose range (mg/dL) 19-147;[id number];130-179  Pt reports pp's 125-157 mg/dL.    Have you had a dilated eye exam in the past 12 months? Yes   Have you had a dental exam in the past 12 months? Yes   Are you checking your feet? No     Dietary Intake   Breakfast Eats out for most meals - has ham and cheese biscuit with occasional hashbrown   Snack (morning) chips, fruit, nuts, cheese   Lunch chicken sandwich, hamburger with occasional fries   Snack (afternoon) chips, fruit, nuts, cheese   Dinner sub with meat and vegetables; meat and vegetables, salad   Snack (evening) chips, fruit, nuts, cheese   Beverage(s) water, 1 diet soda per day     Exercise   Exercise Type Light (walking / raking leaves)   How many days per week to you exercise? 3   How many minutes per day  do you exercise? 30   Total minutes per week of exercise 90     Patient Education   Previous Diabetes Education No   Disease state  Definition of diabetes, type 1 and 2, and the diagnosis of diabetes   Nutrition management  Role of diet in the treatment of diabetes and the relationship between the three main macronutrients and blood glucose level;Food label reading, portion sizes and measuring food.;Reviewed blood glucose goals for pre and post meals and how to evaluate the patients' food  intake on their blood glucose level.;Information on hints to eating out and maintain blood glucose control.   Physical activity and exercise  Role of exercise on diabetes management, blood pressure control and cardiac health.   Medications Reviewed patients medication for diabetes, action, purpose, timing of dose and side effects.   Monitoring Purpose and frequency of SMBG.;Taught/discussed recording of test results and interpretation of SMBG.;Identified appropriate SMBG and/or A1C goals.   Psychosocial adjustment Identified and addressed patients feelings and concerns about diabetes     Individualized Goals (developed by patient)   Reducing Risk Improve blood sugars Lose weight Lead a healthier lifestyle     Outcomes   Expected Outcomes Demonstrated interest in learning. Expect positive outcomes   Future DMSE Other (comment)  Pt to check her work schedule and call back for classes.       Individualized Plan for Diabetes Self-Management Training:   Learning Objective:  Patient will have a greater understanding of diabetes self-management. Patient education plan is to attend individual and/or group sessions per assessed needs and concerns.   Plan:   Patient Instructions  Check blood sugars 2 x day before breakfast and 2 hrs after supper every day Exercise: Continue walking for  30  minutes  3 days a week and gradually increase to 30 minutes 5 x week Eat 3 meals day,   2  snacks a day Space meals 4-6 hours apart Limit fried foods and foods high in fat (fries, biscuits, chips) Bring blood sugar records to the next class   Expected Outcomes:  Demonstrated interest in learning. Expect positive outcomes  Education material provided:  General Meal Planning Guidelines Simple Meal Plan  If problems or questions, patient to contact team via:  Sharion SettlerSheila Zyron Deeley, RN, CCM, CDE [phone]  Future DSME appointment: Other (Pt to check her work schedule and call back for classes. )

## 2016-05-31 NOTE — Telephone Encounter (Signed)
Received voice mail from patient. She wants to come for diabetes classes beginning Jan 8. Called patient to confirm. She will begin on this date.

## 2016-06-17 ENCOUNTER — Ambulatory Visit: Payer: Commercial Managed Care - PPO

## 2016-06-19 ENCOUNTER — Telehealth: Payer: Self-pay | Admitting: Dietician

## 2016-06-19 ENCOUNTER — Emergency Department: Payer: Commercial Managed Care - PPO

## 2016-06-19 ENCOUNTER — Emergency Department
Admission: EM | Admit: 2016-06-19 | Discharge: 2016-06-19 | Disposition: A | Discharge status: Home / Self Care | Payer: Commercial Managed Care - PPO | Attending: Emergency Medicine | Admitting: Emergency Medicine

## 2016-06-19 DIAGNOSIS — E119 Type 2 diabetes mellitus without complications: Secondary | ICD-10-CM | POA: Insufficient documentation

## 2016-06-19 DIAGNOSIS — Y990 Civilian activity done for income or pay: Secondary | ICD-10-CM | POA: Insufficient documentation

## 2016-06-19 DIAGNOSIS — S3992XA Unspecified injury of lower back, initial encounter: Secondary | ICD-10-CM | POA: Diagnosis present

## 2016-06-19 DIAGNOSIS — Y929 Unspecified place or not applicable: Secondary | ICD-10-CM | POA: Diagnosis not present

## 2016-06-19 DIAGNOSIS — J45909 Unspecified asthma, uncomplicated: Secondary | ICD-10-CM | POA: Diagnosis not present

## 2016-06-19 DIAGNOSIS — I10 Essential (primary) hypertension: Secondary | ICD-10-CM | POA: Insufficient documentation

## 2016-06-19 DIAGNOSIS — X58XXXA Exposure to other specified factors, initial encounter: Secondary | ICD-10-CM | POA: Insufficient documentation

## 2016-06-19 DIAGNOSIS — Y9389 Activity, other specified: Secondary | ICD-10-CM | POA: Diagnosis not present

## 2016-06-19 DIAGNOSIS — S39012A Strain of muscle, fascia and tendon of lower back, initial encounter: Secondary | ICD-10-CM | POA: Insufficient documentation

## 2016-06-19 DIAGNOSIS — M778 Other enthesopathies, not elsewhere classified: Secondary | ICD-10-CM | POA: Insufficient documentation

## 2016-06-19 LAB — POCT PREGNANCY, URINE: Preg Test, Ur: NEGATIVE

## 2016-06-19 MED ORDER — CYCLOBENZAPRINE HCL 10 MG PO TABS: 10.0000 mg | ORAL_TABLET | Freq: Three times a day (TID) | ORAL | 0 refills | Status: DC | PRN

## 2016-06-19 MED ORDER — NAPROXEN 500 MG PO TABS: 500.0000 mg | ORAL_TABLET | Freq: Two times a day (BID) | ORAL | Status: DC

## 2016-06-19 NOTE — ED Notes (Addendum)
Pt states back pain and L hand cramping. L hand started yest but back pain "for a while." told doc about back pain "a long time ago and she stated it might be arthritis." States hand has cramped before and "then it just stopped."

## 2016-06-19 NOTE — ED Triage Notes (Signed)
Pt reports her left hand it hurting and she is having back pain. Pt reports has told her MD but is nit sure what is causing it. Denies injuries

## 2016-06-19 NOTE — Telephone Encounter (Signed)
Patient did not attend class 1 on 06/17/16. Caller her and left a voicemail message with date for start of next class series, and requested a call back.

## 2016-06-19 NOTE — ED Provider Notes (Signed)
[facility] Emergency Department Provider Note   ____________________________________________   First MD Initiated Contact with Patient 06/19/16 1238     (approximate)  I have reviewed the triage vital signs and the nursing notes.   HISTORY  Chief Complaint Hand Pain and Back Pain    HPI Cheryl Roberts is a 46 y.o. female patient complain of left wrist and hand pain which occurred at work yesterday. Patient stated pain increases with dorsiflexion of the wrist. Patient is right-hand dominant. Patient also complaining of increasing chronic back pain. Patient denies any provocative incident for back pain. Patient denies any palliative measures for pain. Patient rates the pain as a 6/10.   Past Medical History:  Diagnosis Date  . Asthma   . Chronic sinusitis   . Diabetes mellitus without complication ([place])   . Hyperlipidemia   . Hypertension     Patient Active Problem List   Diagnosis Date Noted  . Allergic rhinitis due to allergen 03/26/2016  . Hyperlipidemia associated with type 2 diabetes mellitus ([place]) 03/15/2016  . Hypertension 02/09/2016  . Diabetes mellitus without complication ([place]) 25/42/7062  . DJD (degenerative joint disease) of cervical spine 02/09/2016  . Obesity 02/09/2016  . Asthma, mild intermittent 02/09/2016  . Sialoadenitis 02/28/2003    No past surgical history on file.  Prior to Admission medications   Medication Sig Start Date End Date Taking? Authorizing Provider  albuterol (PROVENTIL HFA;VENTOLIN HFA) 108 (90 Base) MCG/ACT inhaler Inhale 2 puffs into the lungs every 2 (two) hours as needed for wheezing or shortness of breath (cough). 02/09/16   Olin Hauser, DO  aspirin 81 MG tablet Take 1 tablet (81 mg total) by mouth daily. 03/15/16   Olin Hauser, DO  atorvastatin (LIPITOR) 40 MG tablet Take 1 tablet (40 mg total) by mouth daily. 03/15/16   Olin Hauser, DO  Blood Glucose Monitoring Suppl  (TRUE METRIX METER) w/Device KIT 1 each by Does not apply route as directed. 05/29/16   Olin Hauser, DO  cyclobenzaprine (FLEXERIL) 10 MG tablet Take 0.5-1 tablets (5-10 mg total) by mouth 2 (two) times daily as needed for muscle spasms. 02/09/16   Olin Hauser, DO  cyclobenzaprine (FLEXERIL) 10 MG tablet Take 1 tablet (10 mg total) by mouth 3 (three) times daily as needed. 06/19/16   Sable Feil, PA-C  fluticasone (FLONASE) 50 MCG/ACT nasal spray Place 1 spray into both nostrils daily. For 2 to 4 weeks then as needed. 03/26/16   Olin Hauser, DO  glucose blood (TRUE METRIX BLOOD GLUCOSE TEST) test strip Check blood sugar twice daily. Dx E11.9 05/29/16   Olin Hauser, DO  hydrochlorothiazide (HYDRODIURIL) 25 MG tablet Take 1 tablet (25 mg total) by mouth daily. 02/09/16   Olin Hauser, DO  ibuprofen (ADVIL,MOTRIN) 600 MG tablet Take 1 tablet (600 mg total) by mouth every 8 (eight) hours as needed. 04/12/16   Olin Hauser, DO  LANCETS ULTRA FINE MISC 1 each by Does not apply route 2 (two) times daily. Check blood sugar twice daily. Dx: E11.9 05/29/16   Olin Hauser, DO  lisinopril (PRINIVIL,ZESTRIL) 10 MG tablet Take 1 tablet (10 mg total) by mouth daily. 02/09/16   Olin Hauser, DO  loratadine (CLARITIN) 10 MG tablet Take 1 tablet (10 mg total) by mouth daily. 03/26/16   Olin Hauser, DO  metFORMIN (GLUCOPHAGE-XR) 750 MG 24 hr tablet Take 1 tablet (750 mg total) by mouth daily with  breakfast. 05/16/16   Olin Hauser, DO  naproxen (NAPROSYN) 500 MG tablet Take 1 tablet (500 mg total) by mouth 2 (two) times daily with a meal. 06/19/16   Sable Feil, PA-C    Allergies Amoxicillin  Family History  Problem Relation Age of Onset  . Hypertension Mother   . Hyperthyroidism Mother   . Gout Mother   . Diabetes Mother   . Arthritis Mother   . Cancer Father     Social History Social History    Substance Use Topics  . Smoking status: Never Smoker  . Smokeless tobacco: Never Used  . Alcohol use Yes     Comment: occas wine or beer    Review of Systems Constitutional: No fever/chills Eyes: No visual changes. ENT: No sore throat. Cardiovascular: Denies chest pain. Respiratory: Denies shortness of breath. Gastrointestinal: No abdominal pain.  No nausea, no vomiting.  No diarrhea.  No constipation. Genitourinary: Negative for dysuria. Musculoskeletal: Left wrist and hand pain. No back pain.  Skin: Negative for rash. Neurological: Negative for headaches, focal weakness or numbness. Endocrine:Diabetes, hyperlipidemia and hypertension Hematological/Lymphatic: Allergic/Immunilogical: Amoxicillin  ____________________________________________   PHYSICAL EXAM:  VITAL SIGNS: ED Triage Vitals  Enc Vitals Group     BP 06/19/16 1119 136/77     Pulse Rate 06/19/16 1119 77     Resp 06/19/16 1119 20     Temp 06/19/16 1119 98.6 F (37 C)     Temp Source 06/19/16 1119 Oral     SpO2 06/19/16 1119 99 %     Weight 06/19/16 1120 140 lb (63.5 kg)     Height 06/19/16 1120 _0  (1.448 m)     Head Circumference --      Peak Flow --      Pain Score 06/19/16 1120 6     Pain Loc --      Pain Edu? --      Excl. in [place]? --     Constitutional: Alert and oriented. Well appearing and in no acute distress. Eyes: Conjunctivae are normal. PERRL. EOMI. Head: Atraumatic. Nose: No congestion/rhinnorhea. Mouth/Throat: Mucous membranes are moist.  Oropharynx non-erythematous. Neck: No stridor.  No cervical spine tenderness to palpation. Hematological/Lymphatic/Immunilogical: No cervical lymphadenopathy. Cardiovascular: Normal rate, regular rhythm. Grossly normal heart sounds.  Good peripheral circulation. Respiratory: Normal respiratory effort.  No retractions. Lungs CTAB. Gastrointestinal: Soft and nontender. No distention. No abdominal bruits. No CVA tenderness. Musculoskeletal: Patient is  right-hand dominant. No obvious edema erythema or ecchymosis to the left wrist and hand. Patient has full nuchal range of motion. Patient has guarding with dorsal flexion of the wrist. Examination of back again shows no obvious deformity. Patient has full nuchal range of motion this time. Patient has some moderate guarding palpation of L3 through S1. Patient negative straight leg test. Neurologic:  Normal speech and language. No gross focal neurologic deficits are appreciated. No gait instability. Skin:  Skin is warm, dry and intact. No rash noted. Psychiatric: Mood and affect are normal. Speech and behavior are normal.  ____________________________________________   LABS (all labs ordered are listed, but only abnormal results are displayed)  Labs Reviewed  POC URINE PREG, ED  POCT PREGNANCY, URINE   ____________________________________________  EKG   ____________________________________________  RADIOLOGY  No acute findings x-ray of the lumbar spine. ____________________________________________   PROCEDURES  Procedure(s) performed: None  Procedures  Critical Care performed: No  ____________________________________________   INITIAL IMPRESSION / ASSESSMENT AND PLAN / ED COURSE  Pertinent labs & imaging  results that were available during my care of the patient were reviewed by me and considered in my medical decision making (see chart for details).  Lumbar strain and tendinitis left wrist. Patient given discharge care instructions. Patient given prescription for Flexeril and naproxen. Patient advised to follow-up family doctor for continued care.  Clinical Course    Patient's report 4 left wrist pain and increasing low back pain. Discussed negative findings x-ray with patient. Patient given a Velcro left wrist splint.  ____________________________________________   FINAL CLINICAL IMPRESSION(S) / ED DIAGNOSES  Final diagnoses:  Tendinitis of left wrist  Strain of  lumbar region, initial encounter      NEW MEDICATIONS STARTED DURING THIS VISIT:  New Prescriptions   CYCLOBENZAPRINE (FLEXERIL) 10 MG TABLET    Take 1 tablet (10 mg total) by mouth 3 (three) times daily as needed.   NAPROXEN (NAPROSYN) 500 MG TABLET    Take 1 tablet (500 mg total) by mouth 2 (two) times daily with a meal.     Note:  This document was prepared using Dragon voice recognition software and may include unintentional dictation errors.    Sable Feil, PA-C 06/19/16 [place], MD 06/20/16 1501

## 2016-06-19 NOTE — Discharge Instructions (Signed)
Wear wrist splint 5-7 days while working.

## 2016-06-21 ENCOUNTER — Encounter: Payer: Self-pay | Admitting: Family Medicine

## 2016-06-21 ENCOUNTER — Ambulatory Visit (INDEPENDENT_AMBULATORY_CARE_PROVIDER_SITE_OTHER): Payer: Commercial Managed Care - PPO | Admitting: Family Medicine

## 2016-06-21 VITALS — BP 116/62 | HR 86 | Temp 98.7°F | Resp 16 | Ht <= 58 in | Wt 143.6 lb

## 2016-06-21 DIAGNOSIS — S46812A Strain of other muscles, fascia and tendons at shoulder and upper arm level, left arm, initial encounter: Secondary | ICD-10-CM

## 2016-06-21 DIAGNOSIS — M545 Low back pain, unspecified: Secondary | ICD-10-CM

## 2016-06-21 DIAGNOSIS — M778 Other enthesopathies, not elsewhere classified: Secondary | ICD-10-CM

## 2016-06-21 MED ORDER — CYCLOBENZAPRINE HCL 10 MG PO TABS: 10.0000 mg | ORAL_TABLET | Freq: Three times a day (TID) | ORAL | 1 refills | Status: DC | PRN

## 2016-06-21 MED ORDER — NAPROXEN 500 MG PO TABS: 500.0000 mg | ORAL_TABLET | Freq: Two times a day (BID) | ORAL | 1 refills | Status: DC

## 2016-06-21 NOTE — Patient Instructions (Signed)
Thank you for coming in to clinic today.  1. 1. For your Back Pain - I think that this is due to Muscle Spasms or strain.  2. Continue with anti-inflammatory Naprosyn (Naproxen) 500mg  twice daily (12 hrs apart, with food, breakfast and dinner) every day for next 2 to 4 weeks if helping, then can use only as needed 3. Continue Cyclobenzapine (Flexeril) 10mg  tablets - cut in half for 5mg  at night for muscle relaxant - may make you sedated or sleepy (be careful driving or working on this) if tolerated you can take every 8 hours, half or whole tab 4. May use Tylenol Extra Str 500mg  tabs - may take 1-2 tablets every 6 hours as needed 5. Recommend to start using heating pad on your lower back / left shoulder 1-2x daily for few weeks  This pain may take weeks to months to fully resolve, but hopefully it will respond to the medicine initially. All back injuries (small or serious) are slow to heal since we use our back muscles every day. Be careful with turning, twisting, lifting, sitting / standing for prolonged periods, and avoid re-injury.  If your symptoms significantly worsen with more pain, or new symptoms with weakness in one or both legs, new or different shooting leg pains, numbness in legs or groin, loss of control or retention of urine or bowel movements, please call back for advice and you may need to go directly to the Emergency Department.  Please schedule a follow-up appointment with Dr. Althea CharonKaramalegos in 4-6 for back / shoulder pain as needed  If you have any other questions or concerns, please feel free to call the clinic or send a message through MyChart. You may also schedule an earlier appointment if necessary.  Saralyn PilarAlexander Karamalegos, DO [name]

## 2016-06-21 NOTE — Progress Notes (Signed)
Subjective:    Patient ID: Cheryl Roberts, female    DOB: August 07, 1970, 46 y.o.   MRN: [id number]  Cheryl Roberts is a 46 y.o. 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 Possible injury. Pain in left wrist worst with dorsiflexion. In ED she had x-rays of her back which were normal, treated with Naproxen and Flexeril, given L wrist splint and follow-up. 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 Smoking status: Never Smoker  . Smokeless tobacco: Never Used  . Alcohol use Yes     Comment: occas wine or beer    Review of Systems Per HPI unless specifically indicated above     Objective:    BP 116/62   Pulse 86   Temp 98.7 F (37.1 C) (Oral)   Resp 16   Ht 4\' 9"  (1.448 m)   Wt 143 lb 9.6 oz (65.1 kg)   LMP 05/23/2016 Comment: neg preg test   BMI 31.07 kg/m   Wt Readings from Last 3 Encounters:  06/21/16 143 lb 9.6 oz (65.1 kg)  06/19/16 140 lb (63.5  kg)  05/31/16 142 lb (64.4 kg)    Physical Exam  Constitutional: She appears well-developed and well-nourished. No distress.  Well-appearing, comfortable, cooperative, obese  HENT:  Head: Normocephalic and atraumatic.  Neck: Normal range of motion. Neck supple.  Negative Spurling's maneuver for radiculopathy bilateral  Cardiovascular: Normal rate, regular rhythm, normal heart sounds and intact distal pulses.   No murmur heard. Pulmonary/Chest: Effort normal and breath sounds normal. No respiratory distress. She has no wheezes. She has no rales.  Musculoskeletal: She exhibits no edema.  Left Shoulder / Upper Back Inspection: Symmetrical appearance without swelling. No erythema Palpation: Non-tender over anterior shoulder, biceps, elbow, wrist. Significant posterior shoulder trapezius muscle hypertonicity, mild tenderness. ROM: Full active ROM shoulder, internal rotation, flex / ext. Full active ROM wrist. And Elbow flex/ext. Strength: Normal strength 5/5 flex/ext, ext rot / int rot, grip, rotator cuff str testing. Neurovascular: Distally intact pulses, sensation to light touch  Low Back Inspection: Normal appearance, Large body habitus, no spinal deformity, symmetrical. Palpation: No tenderness over spinous processes. Bilateral lumbar paraspinal muscles non-tender but with some mild hypertonicity/spasm. ROM: Full active ROM forward flex / back extension, rotation L/R without discomfort Special Testing: Seated SLR negative for radicular pain bilaterally. Standing facet load test negative. Strength: Bilateral hip flex/ext 5/5, knee flex/ext 5/5, ankle dorsiflex/plantarflex 5/5 Neurovascular: intact distal sensation to light touch  Left Hand/Wrist Inspection: Normal appearance, symmetrical, no bulky MCP joints, no edema or erythema. Palpation: Non tender hand / wrist, carpal bones, including MCP, base of thumb. No distinct anatomical snuff box or scaphoid tenderness. No tenderness over APL  / EPB tendons radially. ROM: full active wrist ROM flex / ext, ulnar / radial deviation, no pain with radial deviation Special Testing: Negative Finkelstein's test. Negative  Tinel's median nerve, ulnar nerve at elbow Strength: 5/5 grip, thumb opposition, wrist flex/ext Neurovascular: distally intact   Lymphadenopathy:    She has no cervical adenopathy.  Neurological: She is alert.  Skin: Skin is warm and dry. She is not diaphoretic.  Nursing note and vitals reviewed.    I have personally reviewed the radiology report from Lumbar X-ray on 06/20/16  CLINICAL DATA:  Low back pain exam left-sided buttock pain.  EXAM: LUMBAR SPINE - COMPLETE 4+ VIEW  COMPARISON:  None.  FINDINGS: Normal alignment of the lumbar vertebral bodies. Disc spaces and vertebral bodies are maintained. The facets are normally aligned. No pars defects. The visualized bony pelvis is intact.  IMPRESSION: Normal alignment and no acute bony findings or degenerative changes.   Electronically Signed   By: Rudie Meyer M.D.   On: 06/19/2016 14:05      Assessment & Plan:   Problem List Items Addressed This Visit    Strain of left trapezius muscle    Consistent with Left trapezius muscle spasm. Possible strain/injury due to moving resident at work. No neurological deficits or weakness.  Plan: 1. Continue anti-inflammatory Naprosyn 500mg  BID WC x 2-4 weeks (given refills) 2. Continue Flexeril 5-10mg  TID PRN 3. Recommend regular use heating pad / moist heat, stretching, avoid heavy lifting / repetitive activities, Tylenol PRN 4. RTC 4 weeks for re-evaluation       Relevant Medications   naproxen (NAPROSYN) 500 MG tablet   cyclobenzaprine (FLEXERIL) 10 MG tablet   Acute bilateral low back pain without sciatica - Primary    Acute bilateral LBP without associated sciatica. Suspect likely due to muscle spasm/strain, with possible lifting strain injury at work. Without known OA DJD in back, but in  C-spine - No red flag symptoms. Negative SLR for radiculopathy - Improved on therapy, from ED 06/20/16  Plan: 1. Refilled anti-inflammatory trial with rx Naprosyn 500mg  BID wc x 2-4 weeks, then PRN 2. Refilled muscle relaxant with Flexeril 10mg  tabs - take 5-10mg  up to TID PRN, titrate up as tolerated 3. May use Tylenol PRN for breakthrough 4. Encouraged use of heating pad 1-2x daily for now then PRN 5. Follow-up 4 weeks as planned re-evaluation. Consider trial of PT for strengthening.      Relevant Medications   naproxen (NAPROSYN) 500 MG tablet   cyclobenzaprine (FLEXERIL) 10 MG tablet    Other Visit Diagnoses    Tendinitis of left wrist     - Improved now, no obvious DeQuervain's reproduced on exam. Normal appearance - Mild sprain likely - Continue NSAID, Muscle relaxant, may keep wrist splint for activity and overnight if needed     Relevant Medications   naproxen (NAPROSYN) 500 MG tablet          Meds ordered this encounter  Medications  . naproxen (NAPROSYN) 500 MG tablet    Sig: Take 1 tablet (500 mg total) by mouth 2 (two) times daily with a meal. For 2-4 weeks, and then as needed    Dispense:  60 tablet    Refill:  1  . cyclobenzaprine (FLEXERIL) 10  MG tablet    Sig: Take 1 tablet (10 mg total) by mouth 3 (three) times daily as needed.    Dispense:  30 tablet    Refill:  1      Follow up plan: Return in about 6 weeks (around 08/02/2016) for low back pain / shoulder pain.  [name], DO [facility] 06/22/2016, 10:53 AM

## 2016-06-22 DIAGNOSIS — G8929 Other chronic pain: Secondary | ICD-10-CM | POA: Insufficient documentation

## 2016-06-22 DIAGNOSIS — M545 Low back pain, unspecified: Secondary | ICD-10-CM | POA: Insufficient documentation

## 2016-06-22 DIAGNOSIS — S46812A Strain of other muscles, fascia and tendons at shoulder and upper arm level, left arm, initial encounter: Secondary | ICD-10-CM | POA: Insufficient documentation

## 2016-06-22 NOTE — Assessment & Plan Note (Signed)
Acute bilateral LBP without associated sciatica. Suspect likely due to muscle spasm/strain, with possible lifting strain injury at work. Without known OA DJD in back, but in C-spine - No red flag symptoms. Negative SLR for radiculopathy - Improved on therapy, from ED 06/20/16  Plan: 1. Refilled anti-inflammatory trial with rx Naprosyn 500mg  BID wc x 2-4 weeks, then PRN 2. Refilled muscle relaxant with Flexeril 10mg  tabs - take 5-10mg  up to TID PRN, titrate up as tolerated 3. May use Tylenol PRN for breakthrough 4. Encouraged use of heating pad 1-2x daily for now then PRN 5. Follow-up 4 weeks as planned re-evaluation. Consider trial of PT for strengthening.

## 2016-06-22 NOTE — Assessment & Plan Note (Signed)
Consistent with Left trapezius muscle spasm. Possible strain/injury due to moving resident at work. No neurological deficits or weakness.  Plan: 1. Continue anti-inflammatory Naprosyn 500mg  BID WC x 2-4 weeks (given refills) 2. Continue Flexeril 5-10mg  TID PRN 3. Recommend regular use heating pad / moist heat, stretching, avoid heavy lifting / repetitive activities, Tylenol PRN 4. RTC 4 weeks for re-evaluation

## 2016-06-24 ENCOUNTER — Ambulatory Visit: Payer: Commercial Managed Care - PPO

## 2016-07-01 ENCOUNTER — Ambulatory Visit: Payer: Commercial Managed Care - PPO

## 2016-07-10 ENCOUNTER — Encounter: Payer: Self-pay | Admitting: Family Medicine

## 2016-07-10 ENCOUNTER — Ambulatory Visit (INDEPENDENT_AMBULATORY_CARE_PROVIDER_SITE_OTHER): Payer: Commercial Managed Care - PPO | Admitting: Family Medicine

## 2016-07-10 VITALS — BP 128/70 | HR 96 | Temp 100.3°F | Resp 16 | Ht <= 58 in | Wt 143.0 lb

## 2016-07-10 DIAGNOSIS — R509 Fever, unspecified: Secondary | ICD-10-CM

## 2016-07-10 DIAGNOSIS — J111 Influenza due to unidentified influenza virus with other respiratory manifestations: Secondary | ICD-10-CM

## 2016-07-10 LAB — POCT INFLUENZA A/B
INFLUENZA B, POC: NEGATIVE
Influenza A, POC: NEGATIVE

## 2016-07-10 MED ORDER — OSELTAMIVIR PHOSPHATE 75 MG PO CAPS: 75.0000 mg | ORAL_CAPSULE | Freq: Two times a day (BID) | ORAL | 0 refills | Status: DC

## 2016-07-10 NOTE — Progress Notes (Signed)
Subjective:    Patient ID: Cheryl Roberts, female    DOB: 1970-10-19, 46 y.o.   MRN: [id number]  Cheryl Roberts is a 46 y.o. female presenting on 07/10/2016 for Sore Throat (HA eye pain ear pain cough onset 3 days)  Patient presents for a same day appointment.  HPI  FLU-LIKE SYMPTOMS Reports symptoms started 2 nights ago when she got home from work with scratchy sore throat and frequent throat clearing, then worsening in past 24 hours to Right ear pain, headache, and some back pain with generalized muscle aches all over. 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 - Today temp to 100.18F, but no reported prior fevers - Takes Ibuprofen 800mg  PRN, no longer taking naproxen, taking OTC Cold & Flu med - Received her influenza vaccine this season - Admits some productive cough and wheezing - Denies prior fevers, sweats, chills, nausea, vomiting, diarrhea, abdominal pain  Social History  Substance Use Topics  . Smoking status: Never Smoker  . Smokeless tobacco: Never Used  . Alcohol use Yes     Comment: occas wine or beer    Review of Systems Per HPI unless specifically indicated above     Objective:    BP 128/70 (BP Location: Left Arm, Cuff Size: Normal)   Pulse 96   Temp 100.3 F (37.9 C) (Oral)   Resp 16   Ht 4\' 9"  (1.448 m)   Wt 143 lb (64.9 kg)   SpO2 99%   BMI 30.94 kg/m   Wt Readings from Last 3 Encounters:  07/10/16 143 lb (64.9 kg)  06/21/16 143 lb 9.6 oz (65.1 kg)  06/19/16 140 lb (63.5 kg)    Physical Exam  Constitutional: She is oriented to person, place, and time. She appears well-developed and well-nourished. No distress.  Mildly sick-appearing but still well appearing, comfortable, cooperative  HENT:  Head: Normocephalic and atraumatic.  Mouth/Throat: Oropharynx is clear and moist.  Frontal / maxillary sinuses non-tender. Nares  with some mild turbinate edema with congestion without purulence. Bilateral TMs clear without erythema, effusion or bulging. Oropharynx mild generalized erythema without focal abnormality and no exudates, edema or asymmetry.  Eyes: Conjunctivae are normal. Right eye exhibits no discharge. Left eye exhibits no discharge.  Neck: Normal range of motion. Neck supple.  Cardiovascular: Regular rhythm, normal heart sounds and intact distal pulses.   No murmur heard. Mild tachycardia HR 96  Pulmonary/Chest: Effort normal and breath sounds normal. No respiratory distress. She has no wheezes. She has no rales.  Abdominal: Soft. Bowel sounds are normal. She exhibits no distension. There is no tenderness.  Musculoskeletal: She exhibits no edema.  Lymphadenopathy:    She has no cervical adenopathy.  Neurological: She is alert and oriented to person, place, and time.  Skin: Skin is warm and dry. No rash noted. She is not diaphoretic. No erythema.  Psychiatric: Her behavior is normal.  Nursing note and vitals reviewed.  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 No other  focal findings of infection on exam today  Plan: 1. Start Tamiflu 75mg  capsules BID x 5 days 2. Supportive care, improve hydration, Ibuprofen 600-800 q 8 hr PRN, Tylenol breakthrough, continue OTC cold/flu med, may start Mucinex if worsening chest congestion / cough 3. Note out of work, return Monday 4. Return criteria given, follow-up if not improved    Relevant Medications   oseltamivir (TAMIFLU) 75 MG capsule      Meds ordered this encounter  Medications    . oseltamivir (TAMIFLU) 75 MG capsule    Sig: Take 1 capsule (75 mg total) by mouth 2 (two) times daily. For 5 days    Dispense:  10 capsule    Refill:  0      Follow up plan: Return in about 2 weeks (around 07/24/2016), or if symptoms worsen or fail to improve, for flu like illness, bronchitis.  Saralyn PilarAlexander Karamalegos, DO [facility] 07/10/2016, 10:21 AM

## 2016-07-10 NOTE — Patient Instructions (Signed)
Thank you for coming in to clinic today.  1. Your rapid Flu test was NEGATIVE, however it is still possible to have the Flu given your exposure - Start Tamiflu 75mg  capsule take one twice a day for 5 days - Start Mucinex 2-3 times a day for 7 days to help clear mucus and cough - Continue Alka Seltzer - Ibuprofen 600-800mg  every 8 hours or 3 times a day as needed for fever/chills or aches - You can take Tylenol Extra Strength 500mg  tabs - take 1 to 2 tabs per dose (max 1000mg ) every 6-8 hours for pain, max 24 hour daily dose is 6 tablets or 3000mg  - Drink plenty of fluids - Continue Loratadine and Flonase as needed  If significant worsening fevers, productive cough or not improved after about 1 week or new concerns can return  Please schedule a follow-up appointment with Dr. Althea CharonKaramalegos in 1-2 weeks as needed if worsening flu-like illness or bronchitis  If you have any other questions or concerns, please feel free to call the clinic or send a message through MyChart. You may also schedule an earlier appointment if necessary.  Saralyn PilarAlexander Jamiaya Bina, DO [name]

## 2016-07-10 NOTE — Addendum Note (Signed)
Addended by: Elvina MattesPATEL, Roemello Speyer D on: 07/10/2016 11:17 AM   Modules accepted: Orders

## 2016-08-06 ENCOUNTER — Encounter: Payer: Self-pay | Admitting: *Deleted

## 2016-08-23 ENCOUNTER — Encounter: Payer: Self-pay | Admitting: Family Medicine

## 2016-08-23 ENCOUNTER — Ambulatory Visit (INDEPENDENT_AMBULATORY_CARE_PROVIDER_SITE_OTHER): Payer: Commercial Managed Care - PPO | Admitting: Family Medicine

## 2016-08-23 VITALS — BP 131/69 | HR 71 | Temp 98.3°F | Resp 16 | Ht <= 58 in | Wt 144.0 lb

## 2016-08-23 DIAGNOSIS — J302 Other seasonal allergic rhinitis: Secondary | ICD-10-CM

## 2016-08-23 DIAGNOSIS — G8929 Other chronic pain: Secondary | ICD-10-CM | POA: Diagnosis not present

## 2016-08-23 DIAGNOSIS — E66811 Obesity, class 1: Secondary | ICD-10-CM

## 2016-08-23 DIAGNOSIS — M545 Low back pain, unspecified: Secondary | ICD-10-CM

## 2016-08-23 DIAGNOSIS — E669 Obesity, unspecified: Secondary | ICD-10-CM

## 2016-08-23 DIAGNOSIS — J4520 Mild intermittent asthma, uncomplicated: Secondary | ICD-10-CM | POA: Diagnosis not present

## 2016-08-23 DIAGNOSIS — E119 Type 2 diabetes mellitus without complications: Secondary | ICD-10-CM

## 2016-08-23 DIAGNOSIS — I10 Essential (primary) hypertension: Secondary | ICD-10-CM | POA: Diagnosis not present

## 2016-08-23 LAB — POCT GLYCOSYLATED HEMOGLOBIN (HGB A1C): HEMOGLOBIN A1C: 8.3

## 2016-08-23 MED ORDER — METFORMIN HCL ER 750 MG PO TB24: 750.0000 mg | ORAL_TABLET | Freq: Every day | ORAL | 3 refills | Status: DC

## 2016-08-23 MED ORDER — IPRATROPIUM BROMIDE 0.06 % NA SOLN: 2.0000 | Freq: Four times a day (QID) | NASAL | 0 refills | Status: DC

## 2016-08-23 MED ORDER — CYCLOBENZAPRINE HCL 10 MG PO TABS: 10.0000 mg | ORAL_TABLET | Freq: Three times a day (TID) | ORAL | 3 refills | Status: DC | PRN

## 2016-08-23 NOTE — Assessment & Plan Note (Signed)
Wt stable recently Encouraged on improved regular exercise Future labs / lipids 02/2017

## 2016-08-23 NOTE — Assessment & Plan Note (Signed)
Controlled, without flares. No wheezing today. Consider future singulair in setting of persistent allergic rhinitis and lingering cough Holding Lisinopril first to see if ACEi cough.

## 2016-08-23 NOTE — Assessment & Plan Note (Signed)
Improved now only intermittent flares on chronic bilateral LBP without associated sciatica. Suspect likely due to muscle spasm/strain, with frequent lifting strain injury at work. - Without known OA DJD in back, but in C-spine - No red flag symptoms - Last Lumbar X-ray 06/2016 in ED, unremarkable   Plan: 1. Refilled muscle relaxant with Flexeril 10mg  tabs - take 5-10mg  up to TID PRN, titrate up as tolerated 2. May use Naproxen PRN flares only, Tylenol PRN for breakthrough 3. Encouraged use of heating pad 1-2x daily for now then PRN 4. Follow-up as needed, discussed importance of regular stretching, regular walking and activity, consider trial of PT for strengthening core and learning home exercises

## 2016-08-23 NOTE — Assessment & Plan Note (Signed)
Stable, controlled BP, outside readings reportedly normal. Now concern with possible lingering dry cough on ACEi since resumed 02/2016, prior treatment for 1-2 years in past prior PCP but unclear if had cough symptoms. No known complications  Plan: 1. HOLD Lisinopril now, concern for ACEi cough reaction, stay off for up to 1 month to see of cough resolves 2. Continue HCTZ 25mg  daily 3. Monitor BP closely outside office over next 1 month - if >140/90 persistently can contact office sooner for replacement med 4. Follow-up by phone or in office within 1 month, if resolved cough off ACEi, note as allergy/reaction, and switch to Losartan. If still coughing and more allergy/sinus symptoms, then can resume Lisinopril and consider additional therapy such as Singulair

## 2016-08-23 NOTE — Progress Notes (Signed)
Subjective:    Patient ID: Cheryl Roberts, female    DOB: Apr 30, 1971, 46 y.o.   MRN: [id number]  Cheryl Roberts is a 46 y.o. female presenting on 08/23/2016 for Diabetes (highest BS 154 and lowest 132 obtw pt still has cough and cold from 06/2016)  HPI   CHRONIC DM, Type 2: Last visit 05/2016, since then she has been to initial [facility] Lifestyle visit, but no show follow-up DM education. CBGs: Avg 130s, Low 90, High < 200. Checks CBGs 2x daily (am fasting, 2 hr post prandial) Meds: Metformin XR 750mg  daily (tolerating well, last switched from Metformin 500 BID due to GI intolerance) Reports good compliance. Currently on ACEi (lisinopril - now concern for possible cough, see below, had been on ACEi for 1-2 years. Lifestyle: improved with regular work schedule now 7am to 3pm    - Diet: Reduced amount of rice, pasta, and bread. Tries to eat fruits. Tries to limit eating late.    - Exercise: Previously no regular, now starting to walk more regularly with warmer weather, does about 30 min outside, and stays active at work, retirement community. States she does not walk or exercise regularly during winter, usually does better in summer. Denies hypoglycemia, polyuria, visual changes, numbness or tingling.  CHRONIC HTN: Reports no concerns, checks BP at work occasionally, normal range. Did not bring log. Current Meds - HCTZ 25mg  and Lisinopril 10mg  daily Reports good compliance, took meds today. Tolerating well, w/o complaints. - Now concern for possible ACEi cough. Denies CP, dyspnea, HA, edema, dizziness / lightheadedness  OBESITY BMI >31 Weight stable in past 3 months Exercising more, see above lifestyle  FOLLOW-UP LOW BACK PAIN / Muscle Spasms: - Last visit for this complaint with back pain 06/2016, has had chronic intermittent back symptoms with pain, without significant known back trauma or injury. 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 Last seen in ED 06/2016 for lumbar strain as well, had Lumbar X-rays normal - Today reports doing well without significant symptoms. But admits still has occasional days after work with muscle spasm or pain in her back, usually lower back. Takes occasional Naproxen, not regular. 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 Often with cough as one of primary symptoms, now currently feels mostly normal but still has cough. Additionally will complain of some congestion and possible postnasal drainage, frequently clears throat, cough is worst at night. Non productive - Taking Loratadine 10mg  daily as prescribed, reported continues to use Flonase, only 1 spray in each nostril, not everyday - History of mild intermittent asthma, but denies wheezing, dyspnea. Not using any albuterol or other asthma treatment   Social History  Substance Use Topics  . Smoking status: Never Smoker  . Smokeless tobacco: Never Used  . Alcohol use Yes     Comment: occas wine or beer    Review of Systems Per HPI unless specifically  indicated above     Objective:    BP 131/69   Pulse 71   Temp 98.3 F (36.8 C) (Oral)   Resp 16   Ht 4\' 9"  (1.448 m)   Wt 144 lb (65.3 kg)   SpO2 100%   BMI 31.16 kg/m   Wt Readings from Last 3 Encounters:  08/23/16 144 lb (65.3 kg)  07/10/16 143 lb (64.9 kg)    06/21/16 143 lb 9.6 oz (65.1 kg)    Physical Exam  Constitutional: She is oriented to person, place, and time. She appears well-developed and well-nourished. No distress.  Well-appearing, comfortable, cooperative, overweight  HENT:  Head: Normocephalic and atraumatic.  Mouth/Throat: Oropharynx is clear and moist.  Frontal / maxillary sinuses non-tender. Nares mostly patent with some congestion and turbinate edema without purulence. Bilateral TMs clear without erythema, effusion or bulging. Oropharynx clear without erythema, exudates, edema or asymmetry.  Eyes: Conjunctivae are normal.  Neck: Normal range of motion. Neck supple. No thyromegaly present.  Cardiovascular: Normal rate, regular rhythm, normal heart sounds and intact distal pulses.   No murmur heard. Pulmonary/Chest: Effort normal and breath sounds normal. No respiratory distress. She has no wheezes. She has no rales.  Good air movement. Speaks full sentences.  Occasional throat clearing and dry cough  Musculoskeletal: She exhibits no edema.  Lymphadenopathy:    She has no cervical adenopathy.  Neurological: She is alert and oriented to person, place, and time.  Skin: Skin is warm and dry. No rash noted. She is not diaphoretic. No erythema.  Psychiatric: Her behavior is normal.  Nursing note and vitals reviewed.        Assessment & Plan:   Problem List Items Addressed This Visit    Obesity (BMI 30.0-34.9)    Wt stable recently Encouraged on improved regular exercise Future labs / lipids 02/2017      Relevant Medications   metFORMIN (GLUCOPHAGE-XR) 750 MG 24 hr tablet   Hypertension    Stable, controlled BP, outside readings reportedly normal. Now concern with possible lingering dry cough on ACEi since resumed 02/2016, prior treatment for 1-2 years in past prior PCP but unclear if had cough symptoms. No known complications  Plan: 1. HOLD Lisinopril now, concern for ACEi cough reaction, stay off for up to 1 month  to see of cough resolves 2. Continue HCTZ 25mg  daily 3. Monitor BP closely outside office over next 1 month - if >140/90 persistently can contact office sooner for replacement med 4. Follow-up by phone or in office within 1 month, if resolved cough off ACEi, note as allergy/reaction, and switch to Losartan. If still coughing and more allergy/sinus symptoms, then can resume Lisinopril and consider additional therapy such as Singulair      Diabetes mellitus without complication (HCC) - Primary    Mild improvement A1c 8.3 (down from 8.9), overall still less control than previous. Again limited data without written CBG log. No complications or hypoglycemia  Plan: 1. Improved tolerance and adherence to Metformin XR 750mg  daily - refilled 90 day supply 2. Discussion on alternative DM medications today - sulfonylurea, SGLT2, GLP1 - anticipate patient would benefit most from SGLT2 vs GLP1, patient adverse to insulin and injections, but seems agreeable to GLP1 if we needed to. 3. Continue improve DM diet, emphasis on start regular walking more regularly now warm weather, suspect cyclical variance in her A1c control, last 7.0 was (02/2016) following spring/summer, then worse A1c in winter 4. On ASA, statin, ACEi - however now holding  ACEi for up to 1 month to see if cough, if needed switch to Losartan 5. Follow-up 3 months DM A1c - if worsening A1c still >8.0 will likely start new med      Relevant Medications   metFORMIN (GLUCOPHAGE-XR) 750 MG 24 hr tablet   Other Relevant Orders   POCT HgB A1C (Completed)   Chronic low back pain without sciatica    Improved now only intermittent flares on chronic bilateral LBP without associated sciatica. Suspect likely due to muscle spasm/strain, with frequent lifting strain injury at work. - Without known OA DJD in back, but in C-spine - No red flag symptoms - Last Lumbar X-ray 06/2016 in ED, unremarkable   Plan: 1. Refilled muscle relaxant with Flexeril 10mg   tabs - take 5-10mg  up to TID PRN, titrate up as tolerated 2. May use Naproxen PRN flares only, Tylenol PRN for breakthrough 3. Encouraged use of heating pad 1-2x daily for now then PRN 4. Follow-up as needed, discussed importance of regular stretching, regular walking and activity, consider trial of PT for strengthening core and learning home exercises      Relevant Medications   cyclobenzaprine (FLEXERIL) 10 MG tablet   Asthma, mild intermittent    Controlled, without flares. No wheezing today. Consider future singulair in setting of persistent allergic rhinitis and lingering cough Holding Lisinopril first to see if ACEi cough.      Allergic rhinitis due to allergen    Stable chronic allergic sinusitis, seems clinically improved today compared to past. Still has some congestion, throat clearing and cough. - Now concern ACEi cough, see A&P HTN  Plan: 1. Continue Loratadine 10mg  daily 2. Hold Flonase 1 week - Start Atrovent nasal spray decongestant 2 sprays in each nostril up to 4 times daily for 7 days, then resume Flonase 2 sprays in each nostril daily for 4-6 weeks or more, previously only using 1 spray 3. Follow-up as needed - future consider Singulair with allergy / mild intermittent asthma history      Relevant Medications   ipratropium (ATROVENT) 0.06 % nasal spray      Meds ordered this encounter  Medications  . metFORMIN (GLUCOPHAGE-XR) 750 MG 24 hr tablet    Sig: Take 1 tablet (750 mg total) by mouth daily with breakfast.    Dispense:  90 tablet    Refill:  3  . cyclobenzaprine (FLEXERIL) 10 MG tablet    Sig: Take 1 tablet (10 mg total) by mouth 3 (three) times daily as needed.    Dispense:  30 tablet    Refill:  3  . ipratropium (ATROVENT) 0.06 % nasal spray    Sig: Place 2 sprays into both nostrils 4 (four) times daily. For up to 5-7 days then stop.    Dispense:  15 mL    Refill:  0      Follow up plan: Return in about 3 months (around 11/23/2016) for  diabetes.  [name], DO [facility] 08/23/2016, 8:20 PM

## 2016-08-23 NOTE — Assessment & Plan Note (Signed)
Stable chronic allergic sinusitis, seems clinically improved today compared to past. Still has some congestion, throat clearing and cough. - Now concern ACEi cough, see A&P HTN  Plan: 1. Continue Loratadine 10mg  daily 2. Hold Flonase 1 week - Start Atrovent nasal spray decongestant 2 sprays in each nostril up to 4 times daily for 7 days, then resume Flonase 2 sprays in each nostril daily for 4-6 weeks or more, previously only using 1 spray 3. Follow-up as needed - future consider Singulair with allergy / mild intermittent asthma history

## 2016-08-23 NOTE — Patient Instructions (Signed)
Thank you for coming in to clinic today.  1. Diabetes - improved A1c from 8.9 down to 8.3 - Still work to do, need to work on increasing regular walking exercise, now with warmer weather, also advised in future colder weather try to walk indoors and stay active - Keep up the good work with diet plan  Refilled Metformin 750mg  XR take daily  No new DM medications. If not improving at next visit we can further discuss new meds  Look into these meds:  Injectable (GLP1 - weight loss as well) 1. Bydureon BCise (Exenatide) - once weekly injection 2. Trulicity (Dulaglutide) - once weekly 3. Victoza (Liraglutide) - once daily  Oral - SGLT2 - Farxiga - inc risk Urinary UTI, lose sugar in urine  -------------------------------------------------------- Contact [facility] if you want to go back to any diabetes classes  --------------------------------------------  For Blood pressure - STOP Lisinopril 10mg  daily (as this MAY CAUSE YOUR COUGH) this could have been a side effect of this, wait about 1 month, to see if cough resolves or improves. If it does, then STAY OFF this med, and contact our office and ask for new REPLACEMENT BP med (Losartan).  --- Also sent new nasal spray ---- Start Atrovent nasal spray decongestant 2 sprays in each nostril up to 4 times daily for 7 days - Continue Loratadine claritin daily - For 1 week while using atrovent HOLD Flonase, then resume Flonase 2 sprays in each nostril every day for 4-6 weeks or more  Refilled Flexeril for back pain, use as needed  In future can consider Physical Therapy  --------------------------------------  Please schedule a follow-up appointment with Dr. Althea CharonKaramalegos in 3 months for Diabetes A1c  If you have any other questions or concerns, please feel free to call the clinic or send a message through MyChart. You may also schedule an earlier appointment if necessary.  Cheryl [name], [name]

## 2016-08-23 NOTE — Assessment & Plan Note (Signed)
Mild improvement A1c 8.3 (down from 8.9), overall still less control than previous. Again limited data without written CBG log. No complications or hypoglycemia  Plan: 1. Improved tolerance and adherence to Metformin XR 750mg  daily - refilled 90 day supply 2. Discussion on alternative DM medications today - sulfonylurea, SGLT2, GLP1 - anticipate patient would benefit most from SGLT2 vs GLP1, patient adverse to insulin and injections, but seems agreeable to GLP1 if we needed to. 3. Continue improve DM diet, emphasis on start regular walking more regularly now warm weather, suspect cyclical variance in her A1c control, last 7.0 was (02/2016) following spring/summer, then worse A1c in winter 4. On ASA, statin, ACEi - however now holding ACEi for up to 1 month to see if cough, if needed switch to Losartan 5. Follow-up 3 months DM A1c - if worsening A1c still >8.0 will likely start new med

## 2016-10-14 ENCOUNTER — Encounter: Payer: Self-pay | Admitting: Emergency Medicine

## 2016-10-14 ENCOUNTER — Emergency Department
Admission: EM | Admit: 2016-10-14 | Discharge: 2016-10-14 | Disposition: A | Discharge status: Home / Self Care | Payer: Commercial Managed Care - PPO | Attending: Emergency Medicine | Admitting: Emergency Medicine

## 2016-10-14 ENCOUNTER — Emergency Department: Payer: Commercial Managed Care - PPO

## 2016-10-14 DIAGNOSIS — Y929 Unspecified place or not applicable: Secondary | ICD-10-CM | POA: Diagnosis not present

## 2016-10-14 DIAGNOSIS — M25512 Pain in left shoulder: Secondary | ICD-10-CM

## 2016-10-14 DIAGNOSIS — S43422A Sprain of left rotator cuff capsule, initial encounter: Secondary | ICD-10-CM | POA: Insufficient documentation

## 2016-10-14 DIAGNOSIS — I10 Essential (primary) hypertension: Secondary | ICD-10-CM | POA: Diagnosis not present

## 2016-10-14 DIAGNOSIS — J45909 Unspecified asthma, uncomplicated: Secondary | ICD-10-CM | POA: Diagnosis not present

## 2016-10-14 DIAGNOSIS — Y999 Unspecified external cause status: Secondary | ICD-10-CM | POA: Diagnosis not present

## 2016-10-14 DIAGNOSIS — Z79899 Other long term (current) drug therapy: Secondary | ICD-10-CM | POA: Diagnosis not present

## 2016-10-14 DIAGNOSIS — W19XXXA Unspecified fall, initial encounter: Secondary | ICD-10-CM | POA: Diagnosis not present

## 2016-10-14 DIAGNOSIS — Z7984 Long term (current) use of oral hypoglycemic drugs: Secondary | ICD-10-CM | POA: Diagnosis not present

## 2016-10-14 DIAGNOSIS — Y939 Activity, unspecified: Secondary | ICD-10-CM | POA: Insufficient documentation

## 2016-10-14 DIAGNOSIS — E119 Type 2 diabetes mellitus without complications: Secondary | ICD-10-CM | POA: Diagnosis not present

## 2016-10-14 DIAGNOSIS — S4992XA Unspecified injury of left shoulder and upper arm, initial encounter: Secondary | ICD-10-CM | POA: Diagnosis present

## 2016-10-14 MED ORDER — MELOXICAM 7.5 MG PO TABS: 7.5000 mg | ORAL_TABLET | Freq: Every day | ORAL | 0 refills | Status: DC

## 2016-10-14 NOTE — ED Provider Notes (Signed)
[facility] Emergency Department Provider Note   ____________________________________________   I have reviewed the triage vital signs and the nursing notes.   HISTORY  Chief Complaint Arm Pain    HPI Cheryl Roberts is a 46 y.o. female presents with left shoulder pain that has worsened since a fall in January. Patient fell directly on the shoulder from a standing position. She notes aggravating positions and activities are overhead, reaching out for an object and sleeping on the left shoulder. She describes aching and tenderness to palpation along left middle deltoid insertion, bicep tendon and approximately the supraspinatus tendon. Patient denies numbness, tingling or weakness in the left upper extremity. She denies fever, chills, or recent illness.    Past Medical History:  Diagnosis Date  . Asthma   . Chronic sinusitis   . Diabetes mellitus without complication ([name])   . Hyperlipidemia   . Hypertension     Patient Active Problem List   Diagnosis Date Noted  . Chronic low back pain without sciatica 06/22/2016  . Strain of left trapezius muscle 06/22/2016  . Allergic rhinitis due to allergen 03/26/2016  . Hyperlipidemia associated with type 2 diabetes mellitus ([name]) 03/15/2016  . Hypertension 02/09/2016  . Diabetes mellitus without complication ([name]) 54/00/8676  . DJD (degenerative joint disease) of cervical spine 02/09/2016  . Obesity (BMI 30.0-34.9) 02/09/2016  . Asthma, mild intermittent 02/09/2016  . Sialoadenitis 02/28/2003    History reviewed. No pertinent surgical history.  Prior to Admission medications   Medication Sig Start Date End Date Taking? Authorizing Provider  albuterol (PROVENTIL HFA;VENTOLIN HFA) 108 (90 Base) MCG/ACT inhaler Inhale 2 puffs into the lungs every 2 (two) hours as needed for wheezing or shortness of breath (cough). 02/09/16   Karamalegos, Devonne Doughty, DO  aspirin 81 MG tablet Take 1 tablet (81 mg total) by  mouth daily. 03/15/16   Karamalegos, Devonne Doughty, DO  atorvastatin (LIPITOR) 40 MG tablet Take 1 tablet (40 mg total) by mouth daily. 03/15/16   Karamalegos, Devonne Doughty, DO  Blood Glucose Monitoring Suppl (TRUE METRIX METER) w/Device KIT 1 each by Does not apply route as directed. 05/29/16   Karamalegos, Devonne Doughty, DO  cyclobenzaprine (FLEXERIL) 10 MG tablet Take 1 tablet (10 mg total) by mouth 3 (three) times daily as needed. 08/23/16   Karamalegos, Devonne Doughty, DO  fluticasone (FLONASE) 50 MCG/ACT nasal spray Place 1 spray into both nostrils daily. For 2 to 4 weeks then as needed. 03/26/16   Karamalegos, Devonne Doughty, DO  glucose blood (TRUE METRIX BLOOD GLUCOSE TEST) test strip Check blood sugar twice daily. Dx E11.9 05/29/16   Olin Hauser, DO  hydrochlorothiazide (HYDRODIURIL) 25 MG tablet Take 1 tablet (25 mg total) by mouth daily. 02/09/16   Karamalegos, Devonne Doughty, DO  ibuprofen (ADVIL,MOTRIN) 600 MG tablet Take 1 tablet (600 mg total) by mouth every 8 (eight) hours as needed. 04/12/16   Karamalegos, Devonne Doughty, DO  ipratropium (ATROVENT) 0.06 % nasal spray Place 2 sprays into both nostrils 4 (four) times daily. For up to 5-7 days then stop. 08/23/16   Karamalegos, Devonne Doughty, DO  LANCETS ULTRA FINE MISC 1 each by Does not apply route 2 (two) times daily. Check blood sugar twice daily. Dx: E11.9 05/29/16   Olin Hauser, DO  lisinopril (PRINIVIL,ZESTRIL) 10 MG tablet Take 1 tablet (10 mg total) by mouth daily. 02/09/16   Karamalegos, Devonne Doughty, DO  loratadine (CLARITIN) 10 MG tablet Take 1 tablet (10 mg total) by  mouth daily. 03/26/16   Karamalegos, Devonne Doughty, DO  meloxicam (MOBIC) 7.5 MG tablet Take 1 tablet (7.5 mg total) by mouth daily. 10/14/16 10/14/17  Kijuana Ruppel M, PA-C  metFORMIN (GLUCOPHAGE-XR) 750 MG 24 hr tablet Take 1 tablet (750 mg total) by mouth daily with breakfast. 08/23/16   Parks Ranger, Devonne Doughty, DO  naproxen (NAPROSYN) 500 MG tablet Take 1 tablet (500  mg total) by mouth 2 (two) times daily with a meal. For 2-4 weeks, and then as needed 06/21/16   Olin Hauser, DO    Allergies Amoxicillin  Family History  Problem Relation Age of Onset  . Hypertension Mother   . Hyperthyroidism Mother   . Gout Mother   . Diabetes Mother   . Arthritis Mother   . Cancer Father     Social History Social History  Substance Use Topics  . Smoking status: Never Smoker  . Smokeless tobacco: Never Used  . Alcohol use Yes     Comment: occas wine or beer    Review of Systems Constitutional: No fever/chills Eyes: No visual changes. Cardiovascular: Denies chest pain. Respiratory: Denies SOB Gastrointestinal: No abdominal pain.  No nausea, no vomiting.   Musculoskeletal: Negative for back pain. Left shoulder pain.  Skin: Negative for rash. Neurological: Negative for headaches  ____________________________________________   PHYSICAL EXAM:  VITAL SIGNS: ED Triage Vitals  Enc Vitals Group     BP 10/14/16 1621 131/80     Pulse Rate 10/14/16 1621 86     Resp 10/14/16 1621 18     Temp 10/14/16 1621 98.4 F (36.9 C)     Temp Source 10/14/16 1621 Oral     SpO2 10/14/16 1621 99 %     Weight 10/14/16 1617 149 lb (67.6 kg)     Height 10/14/16 1617 _0  (1.448 m)     Head Circumference --      Peak Flow --      Pain Score 10/14/16 1617 5     Pain Loc --      Pain Edu? --      Excl. in [place]? --     Constitutional: Alert and oriented. Well appearing and in no acute distress. Head: Atraumatic. Cardiovascular: Normal rate, regular rhythm.  Good peripheral circulation. Respiratory: Normal respiratory effort. No SOB Genitourinary: deferred Musculoskeletal: left shoulder pain increases with flexion, abduction and internal rotation. (+) impingement. Left upper extremity strength intact, limited by pain.  Neurologic:  Normal speech and language. No gross focal neurologic deficits are appreciated.  Skin:  Skin is warm, dry and intact. No  rash noted. Psychiatric: Mood and affect are normal.  ____________________________________________   LABS (all labs ordered are listed, but only abnormal results are displayed)  Labs Reviewed - No data to display ____________________________________________  EKG none  ____________________________________________  RADIOLOGY Left shoulder FINDINGS: There is no evidence of fracture or dislocation. There is no evidence of erosive arthropathy or other focal bone abnormality. AC joint DJD. Soft tissues are unremarkable.  IMPRESSION: No acute findings. ____________________________________________   PROCEDURES  Procedure(s) performed: no    Critical Care performed: no ____________________________________________   INITIAL IMPRESSION / ASSESSMENT AND PLAN / ED COURSE  Pertinent labs & imaging results that were available during my care of the patient were reviewed by me and considered in my medical decision making (see chart for details).  Patient presents with left shoulder consistent with rotator cuff sprain and impingement. Physical exam findings and imaging are reassuring that no acute neurological changes  or fracture exist. Patient will be provided a prescription for Mobic and a referral to Orthopedics. Patient advised to modify daily activities to reduce symptoms.   Patient informed of clinical course, understand medical decision-making process, and agree with plan.      ____________________________________________   FINAL CLINICAL IMPRESSION(S) / ED DIAGNOSES  Final diagnoses:  Left shoulder pain, unspecified chronicity  Sprain of left rotator cuff capsule, initial encounter      NEW MEDICATIONS STARTED DURING THIS VISIT:  Discharge Medication List as of 10/14/2016  5:23 PM    START taking these medications   Details  meloxicam (MOBIC) 7.5 MG tablet Take 1 tablet (7.5 mg total) by mouth daily., Starting Mon 10/14/2016, Until Tue 10/14/2017, Print          Note:  This document was prepared using Dragon voice recognition software and may include unintentional dictation errors.   Jerolyn Shin, PA-C 10/15/16 2247    Darel Hong, MD 10/15/16 2351

## 2016-10-14 NOTE — ED Triage Notes (Signed)
Patient presents to the ED with left arm pain since January.  Patient states she fell in January and arm has been hurting since that time.  Patient is in no obvious distress at this time.

## 2016-10-14 NOTE — Discharge Instructions (Signed)
Take medication as prescribed. Return to emergency department if symptoms worsen. Call to schedule follow up referral [facility].

## 2016-11-08 ENCOUNTER — Encounter: Payer: Self-pay | Admitting: Family Medicine

## 2016-11-08 ENCOUNTER — Ambulatory Visit (INDEPENDENT_AMBULATORY_CARE_PROVIDER_SITE_OTHER): Payer: Commercial Managed Care - PPO | Admitting: Family Medicine

## 2016-11-08 ENCOUNTER — Ambulatory Visit: Payer: Commercial Managed Care - PPO | Admitting: Family Medicine

## 2016-11-08 VITALS — BP 138/74 | HR 90 | Temp 98.6°F | Resp 16 | Ht <= 58 in | Wt 145.0 lb

## 2016-11-08 DIAGNOSIS — H6983 Other specified disorders of Eustachian tube, bilateral: Secondary | ICD-10-CM

## 2016-11-08 DIAGNOSIS — H9203 Otalgia, bilateral: Secondary | ICD-10-CM

## 2016-11-08 DIAGNOSIS — J3089 Other allergic rhinitis: Secondary | ICD-10-CM | POA: Diagnosis not present

## 2016-11-08 NOTE — Patient Instructions (Addendum)
Thank you for coming to the clinic today.  1. You do have some fluid behind ear drums, Right ear is worse than Left, consistent with Eustachian Tube Dysfunction  Start back on Flonase 2 sprays each nostril every day - 4-6 weeks, sent new rx to pharmacy  Recommend OTC Mucinex-Sinus (does contain Phenylephrine) - or can try DayQuil  May try to add back on Claritin (or zyrtec or allegra)  Look into "Galbreath Technique" for ear effusion, can do this simple massage 1-2 x daily for few days then stop. And use as needed. May also use a warm moist wash cloth to help loosen up this tissue before or after doing this to help open up eustachian tube.  If any significant worsening, loss of hearing, constant pain, fever/chills, or concern for infection - notify office and we can send in an antibiotic, or even possible prednisone burst few days of steroid with possible rx Prednisone for 3 day taper to try if needed as last resort.  Please schedule a Follow-up Appointment to: Return in about 3 weeks (around 11/29/2016), or if symptoms worsen or fail to improve, for Ear Effusion / Eustachian Tube Dysfunction.  If you have any other questions or concerns, please feel free to call the clinic or send a message through MyChart. You may also schedule an earlier appointment if necessary.  Saralyn PilarAlexander Jahsiah Carpenter, DO [name]

## 2016-11-08 NOTE — Assessment & Plan Note (Addendum)
Likely one of underlying etiology to contribute to sinus pressure and eustachian tube dysfunction She had stopped Flonase See A&P

## 2016-11-08 NOTE — Progress Notes (Signed)
Subjective:    Patient ID: Cheryl Roberts, female    DOB: Sep 02, 1970, 46 y.o.   MRN: [id number]  Cheryl Capuchinammie M Hey is a 46 y.o. female presenting on 11/08/2016 for Ear Pain (Right side onset week and Left onset today)  Patient presents for a same day appointment.  HPI   Bilateral Ear Pain (R>L) / Eustachian Tube Dysfunction / Allergic Rhinosinusitis - Reports symptoms started about 1 week ago with R ear pain and pressure, "feels like fluid" worse if have window down and driving in car with air pressure. Nothing seems to improve symptoms. Tried Meloxicam 15mg  some temporary improvement for several hours. Describes also developed some L ear pain too, but R > L - [facility], had similar problems, and at one point they discussed possible tymp tubes, but never did this, had spontaneous resolution, now seems some gradual recurrence - Denies any decreased hearing, fevers/chills, sweats, ear drainage, sinus pain or pressure, headache, nasal congestion, cough, nausea, vomiting   Social History  Substance Use Topics  . Smoking status: Never Smoker  . Smokeless tobacco: Never Used  . Alcohol use Yes     Comment: occas wine or beer    Review of Systems Per HPI unless specifically indicated above     Objective:    BP 138/74   Pulse 90   Temp 98.6 F (37 C) (Oral)   Resp 16   Ht 4\' 9"  (1.448 m)   Wt 145 lb (65.8 kg)   LMP 10/14/2016   SpO2 100%   BMI 31.38 kg/m   Wt Readings from Last 3 Encounters:  11/08/16 145 lb (65.8 kg)  10/14/16 149 lb (67.6 kg)  08/23/16 144 lb (65.3 kg)    Physical Exam  Constitutional: She appears well-developed and well-nourished. No distress.  Well-appearing, comfortable, cooperative  HENT:  Head: Normocephalic and atraumatic.  Mouth/Throat: Oropharynx is clear and moist.  Frontal / maxillary sinuses non-tender. Nares with some deeper turbinate edema with mild congestion only without purulence.  No external ear tenderness  or abnormality.  Right TM with significant fullness and bulging opaque effusion slight loss of landmarks. No erythema. No purulence.  Left TM clear with mild clear effusion with normal landmarks. No erythema.  Oropharynx clear without erythema, exudates, edema or asymmetry.  Skin: Skin is warm and dry. She is not diaphoretic.  Nursing note and vitals reviewed.  Results for orders placed or performed in visit on 08/23/16  POCT HgB A1C  Result Value Ref Range   Hemoglobin A1C 8.3       Assessment & Plan:   Problem List Items Addressed This Visit    Allergic rhinitis due to allergen    Likely one of underlying etiology to contribute to sinus pressure and eustachian tube dysfunction She had stopped Flonase See A&P       Other Visit Diagnoses    Acute ear pain, bilateral    -  Primary   Dysfunction of both eustachian tubes   Acute on chronic R > L bilateral eustachian tube dysfunction with secondary R > L ear effusion, without loss of hearing or evidence of AOM or sinusitis. Likely related allergic rhinosinusitis based on exam and history. - Inadequate conservative therapy, had stopped Flonase for while prior to symptoms - Prior complicated history with [facility] ago similar problems, almost had tymp tubes but had improvement without  Plan: 1. Restart Flonase 2 sprays each nare daily for up to 4-6 weeks  or longer 2. Restart OTC oral anti-histamine daily 3. Recommend may consider trial OTC oral decongestant for up to 1 week or less 4. AVOID Afrin nasal decongestant, counseled if uses, max dose up to 3 days or less, avoid rebound rhinitis 5. Demonstrated Galbreath Technique today. Advised can use warm moist heat in his area and may try this technique as demonstrated 1-2x daily 2-3 days then stop, avoid over-use 6. Additionally discussed potential benefit of oral steroid, prednisone taper for short course up to 20mg  daily x 3 days, only if refractory symptoms, counseled on  potential side effects 7. Follow-up if not improved or worsening, return criteria           Meds ordered this encounter  Medications  . meloxicam (MOBIC) 7.5 MG tablet    Sig: Take by mouth.  . DISCONTD: cyclobenzaprine (FLEXERIL) 10 MG tablet    Sig: Take by mouth.     Follow up plan: Return in about 3 weeks (around 11/29/2016), or if symptoms worsen or fail to improve, for Ear Effusion / Eustachian Tube Dysfunction.  [name], DO [facility] 11/08/2016, 5:03 PM

## 2016-11-14 ENCOUNTER — Telehealth: Payer: Self-pay | Admitting: Family Medicine

## 2016-11-14 NOTE — Telephone Encounter (Signed)
Called pt advised as per Dr. Althea CharonKaramalegos to keep log of her sugar reading for 2 weeks if she becomes Symptomatic and any abnormal's call the office otherwise keep up follow up appointment no needs to change the dosage of medication.

## 2016-11-14 NOTE — Telephone Encounter (Signed)
Pt checked her sugar this morning and it was 334.  Does she need to adjust medication [phone]

## 2016-11-29 ENCOUNTER — Ambulatory Visit: Payer: Commercial Managed Care - PPO | Admitting: Family Medicine

## 2016-12-06 ENCOUNTER — Ambulatory Visit: Payer: Commercial Managed Care - PPO | Admitting: Family Medicine

## 2016-12-07 ENCOUNTER — Emergency Department
Admission: EM | Admit: 2016-12-07 | Discharge: 2016-12-07 | Disposition: A | Discharge status: Home / Self Care | Payer: Commercial Managed Care - PPO | Attending: Emergency Medicine | Admitting: Emergency Medicine

## 2016-12-07 ENCOUNTER — Encounter: Payer: Self-pay | Admitting: Emergency Medicine

## 2016-12-07 DIAGNOSIS — Z7984 Long term (current) use of oral hypoglycemic drugs: Secondary | ICD-10-CM | POA: Insufficient documentation

## 2016-12-07 DIAGNOSIS — I10 Essential (primary) hypertension: Secondary | ICD-10-CM | POA: Diagnosis not present

## 2016-12-07 DIAGNOSIS — H66001 Acute suppurative otitis media without spontaneous rupture of ear drum, right ear: Secondary | ICD-10-CM | POA: Diagnosis not present

## 2016-12-07 DIAGNOSIS — Z79899 Other long term (current) drug therapy: Secondary | ICD-10-CM | POA: Diagnosis not present

## 2016-12-07 DIAGNOSIS — Z7982 Long term (current) use of aspirin: Secondary | ICD-10-CM | POA: Diagnosis not present

## 2016-12-07 DIAGNOSIS — E119 Type 2 diabetes mellitus without complications: Secondary | ICD-10-CM | POA: Diagnosis not present

## 2016-12-07 DIAGNOSIS — J4520 Mild intermittent asthma, uncomplicated: Secondary | ICD-10-CM | POA: Diagnosis not present

## 2016-12-07 DIAGNOSIS — H9201 Otalgia, right ear: Secondary | ICD-10-CM | POA: Diagnosis present

## 2016-12-07 MED ORDER — CEPHALEXIN 500 MG PO CAPS: 500.0000 mg | ORAL_CAPSULE | Freq: Four times a day (QID) | ORAL | 0 refills | Status: DC

## 2016-12-07 NOTE — ED Triage Notes (Signed)
R earache x 2 weeks.

## 2016-12-07 NOTE — ED Provider Notes (Signed)
[facility] Emergency Department Provider Note  ____________________________________________  Time seen: Approximately 7:36 PM  I have reviewed the triage vital signs and the nursing notes.   HISTORY  Chief Complaint Otalgia    HPI Cheryl Roberts is a 46 y.o. female presenting to the emergency department with 4 out of 10 right otalgia without hearing loss or ear discharge for the past 2 weeks. Patient states that she was seen by primary care and was not prescribed any medications. Patient has a history of recurrent otitis media. No alleviating measures have been attempted. Patient has been afebrile.    Past Medical History:  Diagnosis Date  . Asthma   . Chronic sinusitis   . Diabetes mellitus without complication ([place])   . Hyperlipidemia   . Hypertension     Patient Active Problem List   Diagnosis Date Noted  . Chronic low back pain without sciatica 06/22/2016  . Strain of left trapezius muscle 06/22/2016  . Allergic rhinitis due to allergen 03/26/2016  . Hyperlipidemia associated with type 2 diabetes mellitus ([place]) 03/15/2016  . Hypertension 02/09/2016  . Diabetes mellitus without complication ([place]) 17/79/3903  . DJD (degenerative joint disease) of cervical spine 02/09/2016  . Obesity (BMI 30.0-34.9) 02/09/2016  . Asthma, mild intermittent 02/09/2016  . Sialoadenitis 02/28/2003    History reviewed. No pertinent surgical history.  Prior to Admission medications   Medication Sig Start Date End Date Taking? Authorizing Provider  albuterol (PROVENTIL HFA;VENTOLIN HFA) 108 (90 Base) MCG/ACT inhaler Inhale 2 puffs into the lungs every 2 (two) hours as needed for wheezing or shortness of breath (cough). 02/09/16   Karamalegos, Devonne Doughty, DO  aspirin 81 MG tablet Take 1 tablet (81 mg total) by mouth daily. 03/15/16   Karamalegos, Devonne Doughty, DO  atorvastatin (LIPITOR) 40 MG tablet Take 1 tablet (40 mg total) by mouth daily. 03/15/16   Karamalegos,  Devonne Doughty, DO  Blood Glucose Monitoring Suppl (TRUE METRIX METER) w/Device KIT 1 each by Does not apply route as directed. 05/29/16   Karamalegos, Devonne Doughty, DO  cephALEXin (KEFLEX) 500 MG capsule Take 1 capsule (500 mg total) by mouth 4 (four) times daily. 12/07/16 12/17/16  Lannie Fields, PA-C  cyclobenzaprine (FLEXERIL) 10 MG tablet Take 1 tablet (10 mg total) by mouth 3 (three) times daily as needed. 08/23/16   Karamalegos, Devonne Doughty, DO  fluticasone (FLONASE) 50 MCG/ACT nasal spray Place 1 spray into both nostrils daily. For 2 to 4 weeks then as needed. 03/26/16   Karamalegos, Devonne Doughty, DO  glucose blood (TRUE METRIX BLOOD GLUCOSE TEST) test strip Check blood sugar twice daily. Dx E11.9 05/29/16   Olin Hauser, DO  hydrochlorothiazide (HYDRODIURIL) 25 MG tablet Take 1 tablet (25 mg total) by mouth daily. 02/09/16   Karamalegos, Devonne Doughty, DO  ibuprofen (ADVIL,MOTRIN) 600 MG tablet Take 1 tablet (600 mg total) by mouth every 8 (eight) hours as needed. 04/12/16   Karamalegos, Devonne Doughty, DO  ipratropium (ATROVENT) 0.06 % nasal spray Place 2 sprays into both nostrils 4 (four) times daily. For up to 5-7 days then stop. 08/23/16   Karamalegos, Devonne Doughty, DO  LANCETS ULTRA FINE MISC 1 each by Does not apply route 2 (two) times daily. Check blood sugar twice daily. Dx: E11.9 05/29/16   Olin Hauser, DO  lisinopril (PRINIVIL,ZESTRIL) 10 MG tablet Take 1 tablet (10 mg total) by mouth daily. 02/09/16   Karamalegos, Devonne Doughty, DO  loratadine (CLARITIN) 10 MG tablet Take 1 tablet (  10 mg total) by mouth daily. 03/26/16   Karamalegos, Devonne Doughty, DO  meloxicam (MOBIC) 7.5 MG tablet Take by mouth. 10/14/16 10/14/17  [provider]  metFORMIN (GLUCOPHAGE-XR) 750 MG 24 hr tablet Take 1 tablet (750 mg total) by mouth daily with breakfast. 08/23/16   Parks Ranger, Devonne Doughty, DO  naproxen (NAPROSYN) 500 MG tablet Take 1 tablet (500 mg total) by mouth 2 (two) times daily with a  meal. For 2-4 weeks, and then as needed 06/21/16   Olin Hauser, DO    Allergies Amoxicillin  Family History  Problem Relation Age of Onset  . Hypertension Mother   . Hyperthyroidism Mother   . Gout Mother   . Diabetes Mother   . Arthritis Mother   . Cancer Father     Social History Social History  Substance Use Topics  . Smoking status: Never Smoker  . Smokeless tobacco: Never Used  . Alcohol use Yes     Comment: occas wine or beer     Review of Systems  Constitutional: No fever/chills Eyes: No visual changes. No discharge ENT: Patient has right otalgia.  Cardiovascular: no chest pain. Respiratory: no cough. No SOB. Musculoskeletal: Negative for musculoskeletal pain. Skin: Negative for rash, abrasions, lacerations, ecchymosis. Neurological: Negative for headaches, focal weakness or numbness.   ____________________________________________   PHYSICAL EXAM:  VITAL SIGNS: ED Triage Vitals  Enc Vitals Group     BP 12/07/16 1755 (!) 151/79     Pulse Rate 12/07/16 1755 89     Resp 12/07/16 1755 20     Temp 12/07/16 1755 97.9 F (36.6 C)     Temp Source 12/07/16 1755 Oral     SpO2 12/07/16 1755 100 %     Weight 12/07/16 1756 143 lb (64.9 kg)     Height 12/07/16 1756 '4\' 9"'  (1.448 m)     Head Circumference --      Peak Flow --      Pain Score 12/07/16 1755 10     Pain Loc --      Pain Edu? --      Excl. in [place]? --      Constitutional: Alert and oriented. Well appearing and in no acute distress. Eyes: Conjunctivae are normal. PERRL. EOMI. Head: Atraumatic. ENT:      Ears: Patient's right tympanic membrane cannot be visualized due to wax. Patient's left tympanic membrane cannot be fully visualized due to wax.       Nose: No congestion/rhinnorhea.      Mouth/Throat: Mucous membranes are moist. Posterior pharynx is nonerythematous Neck: Full range of motion.  Hematological/Lymphatic/Immunilogical: No cervical lymphadenopathy. Cardiovascular: Normal  rate, regular rhythm. Normal S1 and S2.  Good peripheral circulation. Respiratory: Normal respiratory effort without tachypnea or retractions. Lungs CTAB. Good air entry to the bases with no decreased or absent breath sounds. Skin:  Skin is warm, dry and intact. No rash noted. Psychiatric: Mood and affect are normal. Speech and behavior are normal. Patient exhibits appropriate insight and judgement.   ____________________________________________   LABS (all labs ordered are listed, but only abnormal results are displayed)  Labs Reviewed - No data to display ____________________________________________  EKG   ____________________________________________  RADIOLOGY  No results found.  ____________________________________________    PROCEDURES  Procedure(s) performed:    Procedures    Medications - No data to display   ____________________________________________   INITIAL IMPRESSION / ASSESSMENT AND PLAN / ED COURSE  Pertinent labs & imaging results that were available during my  care of the patient were reviewed by me and considered in my medical decision making (see chart for details).  Review of the [place] CSRS was performed in accordance of the [place] prior to dispensing any controlled drugs.     Assessment and plan: Right Otitis Media  Patient presents to the emergency department with right otalgia that has persisted for the past 2 weeks. Patient has a history of recurrent otitis media. Patient's right tympanic membrane cannot be entirely visualized due to wax. She was advised to follow-up with primary care for cerumen disimpaction Patient was treated empirically with keflex due to amoxicillin allergy. Vital signs were reassuring prior to discharge. All patient questions were answered.`     ____________________________________________  FINAL CLINICAL IMPRESSION(S) / ED DIAGNOSES  Final diagnoses:  Acute suppurative otitis media of right ear without  spontaneous rupture of tympanic membrane, recurrence not specified      NEW MEDICATIONS STARTED DURING THIS VISIT:  Discharge Medication List as of 12/07/2016  6:22 PM    START taking these medications   Details  cephALEXin (KEFLEX) 500 MG capsule Take 1 capsule (500 mg total) by mouth 4 (four) times daily., Starting Sat 12/07/2016, Until Tue 12/17/2016, Print            This chart was dictated using voice recognition software/Dragon. Despite best efforts to proofread, errors can occur which can change the meaning. Any change was purely unintentional.    Lannie Fields, PA-C 12/07/16 1943    Orbie Pyo, MD 12/07/16 2352

## 2016-12-10 ENCOUNTER — Ambulatory Visit: Payer: Commercial Managed Care - PPO | Admitting: Family Medicine

## 2016-12-16 ENCOUNTER — Ambulatory Visit (INDEPENDENT_AMBULATORY_CARE_PROVIDER_SITE_OTHER): Payer: Commercial Managed Care - PPO | Admitting: Family Medicine

## 2016-12-16 ENCOUNTER — Other Ambulatory Visit: Payer: Self-pay | Admitting: Family Medicine

## 2016-12-16 ENCOUNTER — Encounter: Payer: Self-pay | Admitting: Family Medicine

## 2016-12-16 VITALS — BP 126/73 | HR 98 | Ht <= 58 in | Wt 143.4 lb

## 2016-12-16 DIAGNOSIS — IMO0001 Reserved for inherently not codable concepts without codable children: Secondary | ICD-10-CM

## 2016-12-16 DIAGNOSIS — R799 Abnormal finding of blood chemistry, unspecified: Secondary | ICD-10-CM

## 2016-12-16 DIAGNOSIS — E785 Hyperlipidemia, unspecified: Secondary | ICD-10-CM

## 2016-12-16 DIAGNOSIS — Z0000 Encounter for general adult medical examination without abnormal findings: Secondary | ICD-10-CM

## 2016-12-16 DIAGNOSIS — E669 Obesity, unspecified: Secondary | ICD-10-CM | POA: Diagnosis not present

## 2016-12-16 DIAGNOSIS — E119 Type 2 diabetes mellitus without complications: Secondary | ICD-10-CM

## 2016-12-16 DIAGNOSIS — I10 Essential (primary) hypertension: Secondary | ICD-10-CM

## 2016-12-16 DIAGNOSIS — E1169 Type 2 diabetes mellitus with other specified complication: Secondary | ICD-10-CM

## 2016-12-16 DIAGNOSIS — E1165 Type 2 diabetes mellitus with hyperglycemia: Secondary | ICD-10-CM

## 2016-12-16 LAB — POCT GLYCOSYLATED HEMOGLOBIN (HGB A1C): Hemoglobin A1C: 9.8

## 2016-12-16 MED ORDER — FARXIGA 5 MG PO TABS: 5.0000 mg | ORAL_TABLET | Freq: Every day | ORAL | 5 refills | Status: DC

## 2016-12-16 NOTE — Assessment & Plan Note (Signed)
Uncontrolled DM with A1c 9.8 (worsening control from 8.3, prior 9 overall raising trend) No known complications or hypoglycemia.  Plan: 1. Start Farxiga 5mg  daily - SGLT2, discussed risks, benefits, and mechanism of action of this med, advised inc hydration water to limit potential UTI, yeast infections and dehydration. May need titrate dose up to 10mg  in future. 2. Continue current Metformin XR 750mg  daily - future consider double dose to 1500mg  XR daily in AM 3. Encourage improved lifestyle - low carb, low sugar diet, reduce portion size, continue improving regular exercise - inc walking to 3-4 x weekly 3. Check CBG still, bring log to next visit for review 4. Continue ASA, ACEi, Statin 5. Will need DM eye exam [facility] 04/2017 6. Follow-up 3 months for Annual Physical + labs including A1c for DM monitoring, may adjust meds at that time

## 2016-12-16 NOTE — Assessment & Plan Note (Signed)
Weight stable, but may be contributing to poor sugars Improve lifestyle, reduce portions Start new Farxiga, may have wt loss component

## 2016-12-16 NOTE — Progress Notes (Signed)
Subjective:    Patient ID: Cheryl Roberts, female    DOB: 06/21/1970, 46 y.o.   MRN: [id number]  Cheryl Roberts is a 46 y.o. female presenting on 12/16/2016 for Diabetes   HPI   CHRONIC DM, Type 2: Last visit 08/23/16 for DM follow-up, with improvement on increased adherence to Metformin XR 750mg , also has had prior DM education [facility] in past. - Today she admits increased stress due to persistent neck pain waiting for clearance to go to Orthopedics from [employer]. Has affected her daily function including appetite and activity. CBGs: Has CBG log today initially in March/April/May 2018 had avg 120-130s, high 150s. Increased CBGs in June 2018 for past 6 weeks, with avg >200, high 330, no lows. Checks CBG x 2 daily (AM fasting and 2 hours postprandial lunch) Meds: Metformin XR 750mg  daily (tolerating well) Reports good compliance. Currently on ACEi, Aspirin, Atorvastatin Lifestyle:    - Diet: Mostly the same, has improved low carb diet but still admits occasional not following diet, now eating larger portions due to recent stressors.    - Exercise: Walking about 2x weekly, waiting on nicer weather. She used to walk more in past. Denies hypoglycemia, polyuria, visual changes, numbness or tingling.  Additional PMH - Left Neck Pain with Cervical Radiculopathy into L upper extremity, awaiting Worker's Comp case to be processed for referral to Orthopedics, on muscle relaxants PRN. Causing patient increased stress admittedly.  Social History  Substance Use Topics  . Smoking status: Never Smoker  . Smokeless tobacco: Never Used  . Alcohol use Yes     Comment: occas wine or beer    Review of Systems Per HPI unless specifically indicated above     Objective:    BP 126/73 (BP Location: Right Arm, Patient Position: Sitting, Cuff Size: Normal)   Pulse 98   Ht 4\' 9"  (1.448 m)   Wt 143 lb 6.4 oz (65 kg)   LMP 12/05/2016   BMI 31.03 kg/m   Wt Readings from Last 3  Encounters:  12/16/16 143 lb 6.4 oz (65 kg)  12/07/16 143 lb (64.9 kg)  11/08/16 145 lb (65.8 kg)    Physical Exam  Constitutional: She is oriented to person, place, and time. She appears well-developed and well-nourished. No distress.  Well-appearing, comfortable, cooperative, overweight  HENT:  Head: Normocephalic and atraumatic.  Mouth/Throat: Oropharynx is clear and moist.  Eyes: Conjunctivae are normal. Right eye exhibits no discharge. Left eye exhibits no discharge.  Cardiovascular: Normal rate.   Pulmonary/Chest: Effort normal.  Musculoskeletal: Normal range of motion. She exhibits no edema.  Neurological: She is alert and oriented to person, place, and time.  Skin: Skin is warm and dry. No rash noted. She is not diaphoretic. No erythema.  Psychiatric: She has a normal mood and affect. Her behavior is normal.  Well groomed, good eye contact, normal speech and thoughts  Nursing note and vitals reviewed.  Results for orders placed or performed in visit on 12/16/16  POCT glycosylated hemoglobin (Hb A1C)  Result Value Ref Range   Hemoglobin A1C 9.8     Recent Labs  05/16/16 0001 08/23/16 1507 12/16/16 1615  HGBA1C 9.0* 8.3 9.8       Assessment & Plan:   Problem List Items Addressed This Visit    Obesity (BMI 30.0-34.9)    Weight stable, but may be contributing to poor sugars Improve lifestyle, reduce portions Start new Farxiga, may have wt loss component  Relevant Medications   FARXIGA 5 MG TABS tablet   Diabetes mellitus without complication (HCC) - Primary    Uncontrolled DM with A1c 9.8 (worsening control from 8.3, prior 9 overall raising trend) No known complications or hypoglycemia.  Plan: 1. Start Farxiga 5mg  daily - SGLT2, discussed risks, benefits, and mechanism of action of this med, advised inc hydration water to limit potential UTI, yeast infections and dehydration. May need titrate dose up to 10mg  in future. 2. Continue current Metformin XR  750mg  daily - future consider double dose to 1500mg  XR daily in AM 3. Encourage improved lifestyle - low carb, low sugar diet, reduce portion size, continue improving regular exercise - inc walking to 3-4 x weekly 3. Check CBG still, bring log to next visit for review 4. Continue ASA, ACEi, Statin 5. Will need DM eye exam [facility] 04/2017 6. Follow-up 3 months for Annual Physical + labs including A1c for DM monitoring, may adjust meds at that time      Relevant Medications   FARXIGA 5 MG TABS tablet      Meds ordered this encounter  Medications  . FARXIGA 5 MG TABS tablet    Sig: Take 5 mg by mouth daily.    Dispense:  30 tablet    Refill:  5    Follow up plan: Return in about 3 months (around 03/18/2017) for Annual Physical and DM A1c.   [name], DO [facility] 12/16/2016, 4:45 PM

## 2016-12-16 NOTE — Patient Instructions (Addendum)
Thank you for coming to the clinic today.  1. A1c is elevated up to 9.8, I am concerned that we need a stronger diabetes medicine.  Start Marcelline DeistFarxiga as discussed today 5mg  once daily in morning. Increased urination and may increase risk of Urinary Tract Infection or Yeast Infection. To reduce this drink plenty of water to help flush out kidneys and prevent any problem.  Activate and bring copay card to pharmacy.  Try to limit portion sizes and starchy carb foods (rice pasta)  Try to increase walking to 3-4 x weekly if able.  Continue Metformin XR 750mg  once daily with breakfast, in future may need to take 2 pills daily at same time.  You will be due for FASTING BLOOD WORK (no food or drink after midnight before, only water or coffee without cream/sugar on the morning of)  - Please go ahead and schedule a "Lab Only" visit in the morning at the clinic for lab draw in 3 months  before next Annual Physical - Make sure Lab Only appointment is at least 1-2 weeks before your next appointment, so that results will be available  For Lab Results, once available within 2-3 days of blood draw, you can can log in to MyChart online to view your results and a brief explanation. Also, we can discuss results at next follow-up visit.   Please schedule a Follow-up Appointment to: Return in about 3 months (around 03/18/2017) for Annual Physical and DM A1c.  If you have any other questions or concerns, please feel free to call the clinic or send a message through MyChart. You may also schedule an earlier appointment if necessary.  Additionally, you may be receiving a survey about your experience at our clinic within a few days to 1 week by e-mail or mail. We value your feedback.  Saralyn PilarAlexander Karamalegos, DO [name]

## 2016-12-23 ENCOUNTER — Telehealth: Payer: Self-pay | Admitting: Family Medicine

## 2016-12-23 ENCOUNTER — Encounter: Payer: Self-pay | Admitting: Family Medicine

## 2016-12-23 DIAGNOSIS — IMO0001 Reserved for inherently not codable concepts without codable children: Secondary | ICD-10-CM

## 2016-12-23 DIAGNOSIS — E1165 Type 2 diabetes mellitus with hyperglycemia: Principal | ICD-10-CM

## 2016-12-23 MED ORDER — EMPAGLIFLOZIN 10 MG PO TABS: 10.0000 mg | ORAL_TABLET | Freq: Every day | ORAL | 11 refills | Status: DC

## 2016-12-23 NOTE — Telephone Encounter (Signed)
Pt has a question about farxiga [phone]

## 2016-12-23 NOTE — Telephone Encounter (Signed)
Her prior Auth denied for Marcelline DeistFarxiga as per patient she don't have money to pay for this meds.

## 2016-12-23 NOTE — Telephone Encounter (Signed)
Left detailed message.   

## 2016-12-23 NOTE — Telephone Encounter (Signed)
Previously PA submitted for Farxiga on 12/16/16 due to uncontrolled A1c on Metformin monotherapy. Trial on SGTL2 however Marcelline DeistFarxiga was not covered by insurance with prior authorization, declined. Elvina Mattesishaben Patel CMA contacted [employer] today to review PA and possible peer to peer, we were able to get Jardiance 10mg  daily approved for 1 year through 12/2017. Requested new rx sent to her retail pharmacy, and I have [facility].  ------------------------------------ Please notify patient that medication was changed to different name, Jardiance, but same type of medication. [facility].  Saralyn PilarAlexander Peace Noyes, DO [facility] 12/23/2016, 12:10 PM

## 2017-01-17 ENCOUNTER — Encounter: Payer: Self-pay | Admitting: Family Medicine

## 2017-01-17 ENCOUNTER — Ambulatory Visit (INDEPENDENT_AMBULATORY_CARE_PROVIDER_SITE_OTHER): Payer: Commercial Managed Care - PPO | Admitting: Family Medicine

## 2017-01-17 VITALS — BP 131/76 | HR 89 | Temp 98.3°F | Resp 16 | Ht <= 58 in | Wt 139.6 lb

## 2017-01-17 DIAGNOSIS — M4722 Other spondylosis with radiculopathy, cervical region: Secondary | ICD-10-CM

## 2017-01-17 DIAGNOSIS — M4802 Spinal stenosis, cervical region: Secondary | ICD-10-CM | POA: Insufficient documentation

## 2017-01-17 DIAGNOSIS — M5412 Radiculopathy, cervical region: Secondary | ICD-10-CM | POA: Diagnosis not present

## 2017-01-17 MED ORDER — PREDNISONE 10 MG PO TABS: ORAL_TABLET | ORAL | 0 refills | Status: DC

## 2017-01-17 MED ORDER — BACLOFEN 10 MG PO TABS: 5.0000 mg | ORAL_TABLET | Freq: Three times a day (TID) | ORAL | 2 refills | Status: DC | PRN

## 2017-01-17 MED ORDER — MELOXICAM 15 MG PO TABS: 15.0000 mg | ORAL_TABLET | Freq: Every day | ORAL | 1 refills | Status: DC

## 2017-01-17 NOTE — Progress Notes (Signed)
Subjective:    Patient ID: Cheryl Roberts, female    DOB: [birth year], 46 y.o.   MRN: [id number]  Cheryl Roberts is a 46 y.o. female presenting on 01/17/2017 for Hand Pain (as per pt had pinched nerve left hand pain getting worst has appt for ortho but not until 09/11 )   HPI   Cervical Radiculopathy, Left - She was followed by Dr Chalmers GuestElston [facility] for worker's comp case for 3 months (since 10/2016) with L neck pain and cervical radiculopathy, but then it was denied due to having prior chronic problem on Left neck pain and muscle pain about 1 year ago, also she had PT at that time, therefore was determined not caused by her work more recently. Recently per Dr Chalmers GuestElston she was on Light Duty for several weeks with still some difficulty at work and not improving, she was limited on time she could stay active on feet, had more breaks, and limited lifting avoid heavy >10 lbs, she is unsure how long she can continue these light duty recs - Here today to re-establish with me for this problem, after following with worker's comp, now she needs to resume care until she sees Orthopedics, already [facility] - Previously treated with muscle relaxant Flexeril 10mg  once daily after work due to sedation, less effective, also taking Meloxicam 7.5mg  every other day and taking Ibuprofen 600mg  every other day - Not taking any other OTC meds for analgesia - Symptoms interfering still with job and her daily function, 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 Smoking status: Never Smoker  . Smokeless tobacco: Never Used  . Alcohol use Yes     Comment: occas wine or beer    Review of Systems Per HPI unless specifically  indicated above     Objective:    BP 131/76   Pulse 89   Temp 98.3 F (36.8 C) (Oral)   Resp 16   Ht 4\' 9"  (1.448 m)   Wt 139 lb 9.6 oz (63.3 kg)   BMI 30.21 kg/m   Wt Readings from Last 3 Encounters:  01/17/17 139 lb 9.6 oz (63.3 kg)  12/16/16 143 lb 6.4 oz (65 kg)  12/07/16 143 lb (64.9 kg)    Physical Exam  Constitutional: She is oriented to person, place, and time. She appears well-developed and well-nourished. No distress.  Well-appearing, comfortable, cooperative  HENT:  Head: Normocephalic and atraumatic.  Mouth/Throat: Oropharynx is clear and moist.  Eyes: Conjunctivae are normal. Right eye exhibits no discharge. Left eye exhibits no discharge.  Neck: Normal range of motion. Neck supple. No thyromegaly present.  Neck / Upper Back Inspection: normal appearance Palpation: mild to moderate spasm and hypertonicity of L sided paraspinal Cervical muscles and trapezius, mild tender to touch localized trigger points ROM: Slightly reduced rotation R, intact flex/ext Special Testing: Spurling's Maneuver positive bilateral R actually reproduces more radicular symptoms than L Strength: intact distal 5/5 Neurovascular: distal intact currently  Cardiovascular: Normal rate and intact distal pulses.   Pulmonary/Chest: Effort normal.  Musculoskeletal: She exhibits no edema.  Lymphadenopathy:    She has no cervical adenopathy.  Neurological: She is alert and oriented to person, place, and time.  Skin:  Skin is warm and dry. No rash noted. She is not diaphoretic. No erythema.  Psychiatric: She has a normal mood and affect. Her behavior is normal.  Well groomed, good eye contact, normal speech and thoughts  Nursing note and vitals reviewed.    I have personally reviewed the radiology report from 10/14/16 from prior doctor at [facility]. Also had Cervical Spine X-ray on 10/18/16 but cannot view results in Epic.  CLINICAL DATA:  LEFT shoulder pain. Extends to the proximal  humerus for 1 month. Larey Seat in January.  EXAM: LEFT SHOULDER - 2+ VIEW  COMPARISON:  None.  FINDINGS: There is no evidence of fracture or dislocation. There is no evidence of erosive arthropathy or other focal bone abnormality. AC joint DJD. Soft tissues are unremarkable.  IMPRESSION: No acute findings.   Electronically Signed   By: Elsie Stain M.D.   On: 10/14/2016 17:18      Assessment & Plan:   Problem List Items Addressed This Visit    DJD (degenerative joint disease) of cervical spine    Chronic underlying problem with C spine DJD See A&P radiculopathy      Relevant Medications   baclofen (LIORESAL) 10 MG tablet   predniSONE (DELTASONE) 10 MG tablet   meloxicam (MOBIC) 15 MG tablet   Cervical radiculopathy - Primary    Persistent L sided cervical paraspinal / trapezius muscle spasm with likely repetitive overuse injury in setting of known previously existing OA/DJD c spine - no significant weakness, but has intermittent radicular symptoms LUE - Last imaging, C-spine X-ray 10/2016, results not available - Last PT 1 year ago similar problem - Limited improvement on NSAID, muscle relaxant  Plan: 1. Agree with plan to follow-up with [facility] as scheduled in September, since problem seems refractory to most conservative therapies thus far 2. Start prednisone taper 6 day 60mg  to 10mg  for radicular symptoms - hold nsaids during that time 3. Increase Meloxicam from 7.5 to 15mg  2-4 weeks then PRN. Stop ibuprofen 4. Stop Flexeril. Switch to Baclofen 5-10mg  TID PRN, less sedation 5. Increase regular Tylenol PRN 6. Referral written for [facility] - may call and schedule next week 7. Note written for continue light duty for up to 4 weeks, if need to addend will do so at request, ideally she should start resuming duty but if unable to may need additional time off, expect ortho to release her to adjust restrictions 8. Follow-up if needed, otherwise see ortho next       Relevant Medications   baclofen (LIORESAL) 10 MG tablet   predniSONE (DELTASONE) 10 MG tablet   meloxicam (MOBIC) 15 MG tablet      Meds ordered this encounter  Medications  . DISCONTD: metaxalone (SKELAXIN) 800 MG tablet    Sig: Take by mouth.  . baclofen (LIORESAL) 10 MG tablet    Sig: Take 0.5-1 tablets (5-10 mg total) by mouth 3 (three) times daily as needed for muscle spasms.    Dispense:  30 each    Refill:  2  . predniSONE (DELTASONE) 10 MG tablet    Sig: Take 6 tabs with breakfast Day 1, 5 tabs Day 2, 4 tabs Day 3, 3 tabs Day 4, 2 tabs Day 5, 1 tab Day 6.    Dispense:  21 tablet    Refill:  0  . meloxicam (MOBIC) 15 MG tablet    Sig: Take 1 tablet (15 mg total) by mouth daily. For 2-4 weeks daily, do not take with ibuprofen.  Dispense:  30 tablet    Refill:  1    Follow up plan: Return if symptoms worsen or fail to improve, for Already scheduled for Annual Physical.  [name], DO [facility] 01/17/2017, 10:23 PM

## 2017-01-17 NOTE — Assessment & Plan Note (Addendum)
Persistent L sided cervical paraspinal / trapezius muscle spasm with likely repetitive overuse injury in setting of known previously existing OA/DJD c spine - no significant weakness, but has intermittent radicular symptoms LUE - Last imaging, C-spine X-ray 10/2016, results not available - Last PT 1 year ago similar problem - Limited improvement on NSAID, muscle relaxant  Plan: 1. Agree with plan to [facility], since problem seems refractory to most conservative therapies thus far 2. Start prednisone taper 6 day 60mg  to 10mg  for radicular symptoms - hold nsaids during that time 3. Increase Meloxicam from 7.5 to 15mg  2-4 weeks then PRN. Stop ibuprofen 4. Stop Flexeril. Switch to Baclofen 5-10mg  TID PRN, less sedation 5. Increase regular Tylenol PRN 6. Referral written for [facility] - may call and schedule next week 7. Note written for continue light duty for up to 4 weeks, if need to addend will do so at request, ideally she should start resuming duty but if unable to may need additional time off, expect ortho to release her to adjust restrictions 8. Follow-up if needed, otherwise see ortho next

## 2017-01-17 NOTE — Assessment & Plan Note (Signed)
Chronic underlying problem with C spine DJD See A&P radiculopathy

## 2017-01-17 NOTE — Patient Instructions (Addendum)
Thank you for coming to the clinic today.  1.  Start Prednisone taper for 6 days, while on predniosone do NOT take Ibuprofen or Meloxicam  When finish Prednisone - start new rx Meloxicam 15mg  once daily with food. Stop Ibuprofen.  Stop Flexeril and start new Baclofen (Lioresal) 10mg  (muscle relaxant) - start with half (cut) to one whole pill at night as needed for next 1-3 nights (may make you drowsy, caution with driving) see how it affects you, then if tolerated increase to one pill 2 to 3 times a day or (every 8 hours as needed)  Recommend to start taking Tylenol Extra Strength 500mg  tabs - take 1 to 2 tabs per dose (max 1000mg ) every 6-8 hours for pain (take regularly, don't skip a dose for next 7 days), max 24 hour daily dose is 6 tablets or 3000mg . In the future you can repeat the same everyday Tylenol course for 1-2 weeks at a time.  - This is safe to take with anti-inflammatory medicines (Mobic)  Heating pad can help as well.  Referral to [facility] - call them first for an appointment.  Recommend continue Light Duty for 4 weeks, until you can see Orthopedics - otherwise, if you need me to change it we can, I just need to know specifics  If needed can consider Gabapentin  Follow-up with Orthopedics 02/18/17  Please schedule a Follow-up Appointment to: Return if symptoms worsen or fail to improve, for Already scheduled for Annual Physical.  If you have any other questions or concerns, please feel free to call the clinic or send a message through MyChart. You may also schedule an earlier appointment if necessary.  Additionally, you may be receiving a survey about your experience at our clinic within a few days to 1 week by e-mail or mail. We value your feedback.  Saralyn PilarAlexander Karamalegos, DO [name]

## 2017-01-20 ENCOUNTER — Telehealth: Payer: Self-pay | Admitting: Family Medicine

## 2017-01-20 ENCOUNTER — Encounter: Payer: Self-pay | Admitting: Family Medicine

## 2017-01-20 NOTE — Telephone Encounter (Signed)
Pt asked for a call back about pain for pinched nerve.  She also asked about being taken out of work until she sees ortho and therapist.  Her call back number is [phone]

## 2017-01-20 NOTE — Telephone Encounter (Signed)
Called patient back to clarify her request. She was seen by me 01/17/17 see note for details. She was continued on previous Light Duty restrictions at that time from [facility] However now today patient requesting to be taken out of work completely until see Ortho, now through 02/18/17 [facility] and they will determine further FMLA or work restrictions. She is scheduled to start [facility] this week as well. She will drop off FMLA paperwork for short term medical disability, and we will complete and return to her within 2-3 days this week. No new letter was requested from me at this time. No medication questions.  Saralyn PilarAlexander Drayden Lukas, DO [facility] 01/20/2017, 12:42 PM

## 2017-01-27 ENCOUNTER — Other Ambulatory Visit (HOSPITAL_COMMUNITY): Payer: Self-pay | Admitting: Family Medicine

## 2017-01-27 ENCOUNTER — Ambulatory Visit
Admission: RE | Admit: 2017-01-27 | Discharge: 2017-01-27 | Disposition: A | Discharge status: Home / Self Care | Payer: Commercial Managed Care - PPO | Source: Ambulatory Visit | Attending: Family Medicine | Admitting: Family Medicine

## 2017-01-27 DIAGNOSIS — R7611 Nonspecific reaction to tuberculin skin test without active tuberculosis: Secondary | ICD-10-CM

## 2017-01-31 ENCOUNTER — Other Ambulatory Visit: Payer: Self-pay | Admitting: Family Medicine

## 2017-01-31 DIAGNOSIS — I10 Essential (primary) hypertension: Secondary | ICD-10-CM

## 2017-01-31 DIAGNOSIS — E119 Type 2 diabetes mellitus without complications: Secondary | ICD-10-CM

## 2017-02-02 ENCOUNTER — Encounter: Payer: Self-pay | Admitting: Emergency Medicine

## 2017-02-02 ENCOUNTER — Emergency Department
Admission: EM | Admit: 2017-02-02 | Discharge: 2017-02-02 | Disposition: A | Discharge status: Home / Self Care | Payer: Commercial Managed Care - PPO | Attending: Emergency Medicine | Admitting: Emergency Medicine

## 2017-02-02 DIAGNOSIS — M5412 Radiculopathy, cervical region: Secondary | ICD-10-CM

## 2017-02-02 DIAGNOSIS — I10 Essential (primary) hypertension: Secondary | ICD-10-CM | POA: Insufficient documentation

## 2017-02-02 DIAGNOSIS — M5010 Cervical disc disorder with radiculopathy, unspecified cervical region: Secondary | ICD-10-CM | POA: Diagnosis not present

## 2017-02-02 DIAGNOSIS — J45909 Unspecified asthma, uncomplicated: Secondary | ICD-10-CM | POA: Diagnosis not present

## 2017-02-02 DIAGNOSIS — Z7982 Long term (current) use of aspirin: Secondary | ICD-10-CM | POA: Diagnosis not present

## 2017-02-02 DIAGNOSIS — E119 Type 2 diabetes mellitus without complications: Secondary | ICD-10-CM | POA: Insufficient documentation

## 2017-02-02 DIAGNOSIS — Z7984 Long term (current) use of oral hypoglycemic drugs: Secondary | ICD-10-CM | POA: Diagnosis not present

## 2017-02-02 DIAGNOSIS — M542 Cervicalgia: Secondary | ICD-10-CM | POA: Diagnosis present

## 2017-02-02 DIAGNOSIS — M5030 Other cervical disc degeneration, unspecified cervical region: Secondary | ICD-10-CM

## 2017-02-02 HISTORY — DX: Radiculopathy, cervical region: M54.12

## 2017-02-02 MED ORDER — DICLOFENAC SODIUM 50 MG PO TBEC
50.0000 mg | DELAYED_RELEASE_TABLET | Freq: Two times a day (BID) | ORAL | 1 refills | Status: DC
Start: 2017-02-02 — End: 2017-03-03

## 2017-02-02 NOTE — Discharge Instructions (Signed)
Your symptoms are consistent with cervical radiculopathy and muscle strain. Stop the Mobic to start a trial of Voltaren (diclofenac). Continue the daily Baclofen. Follow-up with Dr. Ernest Pine on 9/11, as scheduled or sooner. Return to the ED for severely worsening symptoms.

## 2017-02-02 NOTE — ED Provider Notes (Signed)
[facility] Emergency Department Provider Note ____________________________________________  Time seen: 1746  I have reviewed the triage vital signs and the nursing notes.  HISTORY  Chief Complaint  Neck Pain  HPI Cheryl Roberts is a 46 y.o. female presents to the ED for evaluation of neck pain with left radicular symptoms. Patient has history of cervical DDD with radiculopathy and is currently being evaluated at [facility]. She has an appointment September 11th with orthopedics for further evaluation and management. She takes Mobic 15 mg daily for her pain and baclofen for muscle spasms. She denies any recent injury, accident, or injury.She reports limited benefit with her Mobic.   Past Medical History:  Diagnosis Date  . Asthma   . Cervical radiculopathy   . Chronic sinusitis   . Diabetes mellitus without complication ([place])   . Hyperlipidemia   . Hypertension     Patient Active Problem List   Diagnosis Date Noted  . Cervical radiculopathy 01/17/2017  . Chronic low back pain without sciatica 06/22/2016  . Strain of left trapezius muscle 06/22/2016  . Allergic rhinitis due to allergen 03/26/2016  . Hyperlipidemia associated with type 2 diabetes mellitus ([place]) 03/15/2016  . Hypertension 02/09/2016  . Uncontrolled diabetes mellitus type 2 without complications ([place]) 24/26/8341  . DJD (degenerative joint disease) of cervical spine 02/09/2016  . Obesity (BMI 30.0-34.9) 02/09/2016  . Asthma, mild intermittent 02/09/2016  . Sialoadenitis 02/28/2003    History reviewed. No pertinent surgical history.  Prior to Admission medications   Medication Sig Start Date End Date Taking? Authorizing Provider  albuterol (PROVENTIL HFA;VENTOLIN HFA) 108 (90 Base) MCG/ACT inhaler Inhale 2 puffs into the lungs every 2 (two) hours as needed for wheezing or shortness of breath (cough). 02/09/16   Karamalegos, Devonne Doughty, DO  aspirin 81 MG tablet Take 1 tablet (81 mg  total) by mouth daily. 03/15/16   Karamalegos, Devonne Doughty, DO  atorvastatin (LIPITOR) 40 MG tablet Take 1 tablet (40 mg total) by mouth daily. 03/15/16   Karamalegos, Devonne Doughty, DO  baclofen (LIORESAL) 10 MG tablet Take 0.5-1 tablets (5-10 mg total) by mouth 3 (three) times daily as needed for muscle spasms. 01/17/17   Karamalegos, Devonne Doughty, DO  Blood Glucose Monitoring Suppl (TRUE METRIX METER) w/Device KIT 1 each by Does not apply route as directed. 05/29/16   Karamalegos, Devonne Doughty, DO  diclofenac (VOLTAREN) 50 MG EC tablet Take 1 tablet (50 mg total) by mouth 2 (two) times daily. 02/02/17   Doralyn Kirkes, Dannielle Karvonen, PA-C  empagliflozin (JARDIANCE) 10 MG TABS tablet Take 10 mg by mouth daily. 12/23/16   Karamalegos, Devonne Doughty, DO  fluticasone (FLONASE) 50 MCG/ACT nasal spray Place 1 spray into both nostrils daily. For 2 to 4 weeks then as needed. 03/26/16   Karamalegos, Devonne Doughty, DO  glucose blood (TRUE METRIX BLOOD GLUCOSE TEST) test strip Check blood sugar twice daily. Dx E11.9 05/29/16   Olin Hauser, DO  hydrochlorothiazide (HYDRODIURIL) 25 MG tablet TAKE ONE TABLET BY MOUTH EVERY DAY 01/31/17   Parks Ranger, Devonne Doughty, DO  ibuprofen (ADVIL,MOTRIN) 600 MG tablet Take 1 tablet (600 mg total) by mouth every 8 (eight) hours as needed. 04/12/16   Karamalegos, Devonne Doughty, DO  ipratropium (ATROVENT) 0.06 % nasal spray Place 2 sprays into both nostrils 4 (four) times daily. For up to 5-7 days then stop. 08/23/16   Karamalegos, Devonne Doughty, DO  LANCETS ULTRA FINE MISC 1 each by Does not apply route 2 (two) times daily.  Check blood sugar twice daily. Dx: E11.9 05/29/16   Olin Hauser, DO  lisinopril (PRINIVIL,ZESTRIL) 10 MG tablet TAKE ONE TABLET BY MOUTH EVERY DAY 01/31/17   Parks Ranger, Devonne Doughty, DO  loratadine (CLARITIN) 10 MG tablet Take 1 tablet (10 mg total) by mouth daily. 03/26/16   Karamalegos, Devonne Doughty, DO  meloxicam (MOBIC) 15 MG tablet Take 1 tablet (15 mg  total) by mouth daily. For 2-4 weeks daily, do not take with ibuprofen. 01/17/17   Karamalegos, Devonne Doughty, DO  metFORMIN (GLUCOPHAGE-XR) 750 MG 24 hr tablet Take 1 tablet (750 mg total) by mouth daily with breakfast. 08/23/16   Parks Ranger, Devonne Doughty, DO  naproxen (NAPROSYN) 500 MG tablet Take 1 tablet (500 mg total) by mouth 2 (two) times daily with a meal. For 2-4 weeks, and then as needed 06/21/16   Parks Ranger, Devonne Doughty, DO  predniSONE (DELTASONE) 10 MG tablet Take 6 tabs with breakfast Day 1, 5 tabs Day 2, 4 tabs Day 3, 3 tabs Day 4, 2 tabs Day 5, 1 tab Day 6. 01/17/17   Karamalegos, Devonne Doughty, DO    Allergies Amoxicillin  Family History  Problem Relation Age of Onset  . Hypertension Mother   . Hyperthyroidism Mother   . Gout Mother   . Diabetes Mother   . Arthritis Mother   . Cancer Father     Social History Social History  Substance Use Topics  . Smoking status: Never Smoker  . Smokeless tobacco: Never Used  . Alcohol use Yes     Comment: occas wine or beer    Review of Systems  Constitutional: Negative for fever. Musculoskeletal: Negative for back pain. Chronic neck pain as above Skin: Negative for rash. Neurological: Negative for headaches, focal weakness or numbness. ____________________________________________  PHYSICAL EXAM:  VITAL SIGNS: ED Triage Vitals [02/02/17 1722]  Enc Vitals Group     BP 133/79     Pulse Rate 89     Resp 18     Temp 98.3 F (36.8 C)     Temp Source Oral     SpO2 99 %     Weight 140 lb (63.5 kg)     Height '4\' 9"'  (1.448 m)     Head Circumference      Peak Flow      Pain Score 10     Pain Loc      Pain Edu?      Excl. in [place]?     Constitutional: Alert and oriented. Well appearing and in no distress. Head: Normocephalic and atraumatic. Eyes: Conjunctivae are normal. Normal extraocular movements Neck: Supple. No thyromegaly. Hematological/Lymphatic/Immunological: No cervical lymphadenopathy. Cardiovascular: Normal  rate, regular rhythm. Normal distal pulses. Respiratory: Normal respiratory effort. No wheezes/rales/rhonchi. Musculoskeletal: Normal composite fist and grip strength bilaterally. Nontender with normal range of motion in all extremities.  Neurologic:  Normal gait without ataxia. Normal speech and language. No gross focal neurologic deficits are appreciated. ____________________________________________  INITIAL IMPRESSION / ASSESSMENT AND PLAN / ED COURSE  Patient with cervical DDD and LUE radicular symptoms. She will be tried on diclofenac 50 mg BID for neck pain. She will continue her baclofen. She is encouraged to call ortho tomorrow to check on cancellations prior to her 9/11 appointment. Return to the ED as needed.  ____________________________________________  FINAL CLINICAL IMPRESSION(S) / ED DIAGNOSES  Final diagnoses:  DDD (degenerative disc disease), cervical  Cervical radiculopathy      Taren Toops, Dannielle Karvonen, PA-C 02/02/17 1814    Rifenbark,  Milta Deiters, MD 02/02/17 2336

## 2017-02-02 NOTE — ED Triage Notes (Signed)
Pt reports hx of cervical radiculopathy and is seen at [facility] for it. Pt states she has been having this pain since January and is going through therapy for the pain. Pt is on Mobic 15mg  daily but states its not getting any better.  Pt states the pain shoots down from her neck to her fingers and back up.

## 2017-02-02 NOTE — ED Notes (Signed)
See triage note  States she was told she had a pinched in her neck  Is currently taking Mobic   But states pain is getting worse and moving into left shoulder/arm

## 2017-02-19 ENCOUNTER — Emergency Department
Admission: EM | Admit: 2017-02-19 | Discharge: 2017-02-19 | Disposition: A | Discharge status: Home / Self Care | Payer: Commercial Managed Care - PPO | Attending: Emergency Medicine | Admitting: Emergency Medicine

## 2017-02-19 ENCOUNTER — Encounter: Payer: Self-pay | Admitting: *Deleted

## 2017-02-19 DIAGNOSIS — E119 Type 2 diabetes mellitus without complications: Secondary | ICD-10-CM | POA: Diagnosis not present

## 2017-02-19 DIAGNOSIS — Z794 Long term (current) use of insulin: Secondary | ICD-10-CM | POA: Diagnosis not present

## 2017-02-19 DIAGNOSIS — I10 Essential (primary) hypertension: Secondary | ICD-10-CM | POA: Insufficient documentation

## 2017-02-19 DIAGNOSIS — J45909 Unspecified asthma, uncomplicated: Secondary | ICD-10-CM | POA: Insufficient documentation

## 2017-02-19 DIAGNOSIS — Z79899 Other long term (current) drug therapy: Secondary | ICD-10-CM | POA: Insufficient documentation

## 2017-02-19 DIAGNOSIS — R21 Rash and other nonspecific skin eruption: Secondary | ICD-10-CM | POA: Diagnosis not present

## 2017-02-19 MED ORDER — DOXYCYCLINE HYCLATE 100 MG PO TABS: 100.0000 mg | ORAL_TABLET | Freq: Two times a day (BID) | ORAL | 0 refills | Status: DC

## 2017-02-19 NOTE — ED Notes (Signed)
Pt states at 0900 this morning at work noiticed rash on face. Pt presenting with small pink bumps on forehead, cheeks, and across nose, states not as red as earlier in the day. Denies pain or itching or pain.   Pt states also vomited once after eating biscuit this morning "but I felt better after"  Pt recently started rx Rifampin on Monday, has taken 2 doses.

## 2017-02-19 NOTE — ED Notes (Signed)
PT denies night sweats, chills, fever, cough, weight loss.

## 2017-02-19 NOTE — ED Provider Notes (Signed)
[facility] Emergency Department Provider Note  ____________________________________________  Time seen: Approximately 8:29 PM  I have reviewed the triage vital signs and the nursing notes.   HISTORY  Chief Complaint Rash    HPI Cheryl Roberts is a 46 y.o. female who presents to emergency department complaining of rash to the face. 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 Patient is completely symptom-free in regards to cough and hemoptysis. Patient reports that she started rifampin 2 and half days ago. Today she noticed a rash around bilateral eyes, forehead, cheeks. Patient denies any pain with rash. No other areas of rash. Area is both burning and pruritic. No medications prior to arrival. No other complaints at this time.   Past Medical History:  Diagnosis Date  . Asthma   . Cervical radiculopathy   . Chronic sinusitis   . Diabetes mellitus without complication ([place])   . Hyperlipidemia   . Hypertension     Patient Active Problem List   Diagnosis Date Noted  . Cervical radiculopathy 01/17/2017  . Chronic low back pain without sciatica 06/22/2016  . Strain of left trapezius muscle 06/22/2016  . Allergic rhinitis due to allergen 03/26/2016  . Hyperlipidemia associated with type 2 diabetes mellitus ([place]) 03/15/2016  . Hypertension 02/09/2016  . Uncontrolled diabetes mellitus type 2 without complications ([place]) 97/67/3419  . DJD (degenerative joint disease) of cervical spine 02/09/2016  . Obesity (BMI 30.0-34.9) 02/09/2016  . Asthma, mild intermittent 02/09/2016  . Sialoadenitis 02/28/2003    History reviewed. No pertinent surgical history.  Prior to Admission medications   Medication Sig Start Date End Date Taking? Authorizing Provider  albuterol (PROVENTIL HFA;VENTOLIN HFA) 108  (90 Base) MCG/ACT inhaler Inhale 2 puffs into the lungs every 2 (two) hours as needed for wheezing or shortness of breath (cough). 02/09/16   Karamalegos, Devonne Doughty, DO  aspirin 81 MG tablet Take 1 tablet (81 mg total) by mouth daily. 03/15/16   Karamalegos, Devonne Doughty, DO  atorvastatin (LIPITOR) 40 MG tablet Take 1 tablet (40 mg total) by mouth daily. 03/15/16   Karamalegos, Devonne Doughty, DO  baclofen (LIORESAL) 10 MG tablet Take 0.5-1 tablets (5-10 mg total) by mouth 3 (three) times daily as needed for muscle spasms. 01/17/17   Karamalegos, Devonne Doughty, DO  Blood Glucose Monitoring Suppl (TRUE METRIX METER) w/Device KIT 1 each by Does not apply route as directed. 05/29/16   Karamalegos, Devonne Doughty, DO  diclofenac (VOLTAREN) 50 MG EC tablet Take 1 tablet (50 mg total) by mouth 2 (two) times daily. 02/02/17   Menshew, Dannielle Karvonen, PA-C  doxycycline (VIBRA-TABS) 100 MG tablet Take 1 tablet (100 mg total) by mouth 2 (two) times daily. 02/19/17   Keliyah Spillman, Charline Bills, PA-C  empagliflozin (JARDIANCE) 10 MG TABS tablet Take 10 mg by mouth daily. 12/23/16   Karamalegos, Devonne Doughty, DO  fluticasone (FLONASE) 50 MCG/ACT nasal spray Place 1 spray into both nostrils daily. For 2 to 4 weeks then as needed. 03/26/16   Karamalegos, Devonne Doughty, DO  glucose blood (TRUE METRIX BLOOD GLUCOSE TEST) test strip Check blood sugar twice daily. Dx E11.9 05/29/16   Olin Hauser, DO  hydrochlorothiazide (HYDRODIURIL) 25 MG tablet TAKE ONE TABLET BY MOUTH EVERY DAY 01/31/17   Parks Ranger, Devonne Doughty, DO  ibuprofen (ADVIL,MOTRIN) 600 MG tablet Take 1 tablet (600  mg total) by mouth every 8 (eight) hours as needed. 04/12/16   Karamalegos, Devonne Doughty, DO  ipratropium (ATROVENT) 0.06 % nasal spray Place 2 sprays into both nostrils 4 (four) times daily. For up to 5-7 days then stop. 08/23/16   Karamalegos, Devonne Doughty, DO  LANCETS ULTRA FINE MISC 1 each by Does not apply route 2 (two) times daily. Check blood sugar twice  daily. Dx: E11.9 05/29/16   Olin Hauser, DO  lisinopril (PRINIVIL,ZESTRIL) 10 MG tablet TAKE ONE TABLET BY MOUTH EVERY DAY 01/31/17   Parks Ranger, Devonne Doughty, DO  loratadine (CLARITIN) 10 MG tablet Take 1 tablet (10 mg total) by mouth daily. 03/26/16   Karamalegos, Devonne Doughty, DO  meloxicam (MOBIC) 15 MG tablet Take 1 tablet (15 mg total) by mouth daily. For 2-4 weeks daily, do not take with ibuprofen. 01/17/17   Karamalegos, Devonne Doughty, DO  metFORMIN (GLUCOPHAGE-XR) 750 MG 24 hr tablet Take 1 tablet (750 mg total) by mouth daily with breakfast. 08/23/16   Parks Ranger, Devonne Doughty, DO  naproxen (NAPROSYN) 500 MG tablet Take 1 tablet (500 mg total) by mouth 2 (two) times daily with a meal. For 2-4 weeks, and then as needed 06/21/16   Parks Ranger, Devonne Doughty, DO  predniSONE (DELTASONE) 10 MG tablet Take 6 tabs with breakfast Day 1, 5 tabs Day 2, 4 tabs Day 3, 3 tabs Day 4, 2 tabs Day 5, 1 tab Day 6. 01/17/17   Karamalegos, Devonne Doughty, DO    Allergies Amoxicillin  Family History  Problem Relation Age of Onset  . Hypertension Mother   . Hyperthyroidism Mother   . Gout Mother   . Diabetes Mother   . Arthritis Mother   . Cancer Father     Social History Social History  Substance Use Topics  . Smoking status: Never Smoker  . Smokeless tobacco: Never Used  . Alcohol use Yes     Comment: occas wine or beer     Review of Systems  Constitutional: No fever/chills Eyes: No visual changes. No discharge ENT: No upper respiratory complaints. Cardiovascular: no chest pain. Respiratory: no cough. No SOB. Gastrointestinal: No abdominal pain.  No nausea, no vomiting.  No diarrhea.  No constipation. Musculoskeletal: Negative for musculoskeletal pain. Skin: Positive for rash to the bilateral periorbital region, forehead, bilateral cheeks Neurological: Negative for headaches, focal weakness or numbness. 10-point ROS otherwise  negative.  ____________________________________________   PHYSICAL EXAM:  VITAL SIGNS: ED Triage Vitals [02/19/17 1849]  Enc Vitals Group     BP 124/75     Pulse Rate 95     Resp 16     Temp 98.2 F (36.8 C)     Temp Source Oral     SpO2 99 %     Weight 140 lb (63.5 kg)     Height '4\' 9"'  (1.448 m)     Head Circumference      Peak Flow      Pain Score      Pain Loc      Pain Edu?      Excl. in Start?      Constitutional: Alert and oriented. Well appearing and in no acute distress. Eyes: Conjunctivae are normal. PERRL. EOMI. Head: Atraumatic. ENT:      Ears:       Nose: No congestion/rhinnorhea.      Mouth/Throat: Mucous membranes are moist.  Neck: No stridor.    Cardiovascular: Normal rate, regular rhythm. Normal S1 and S2.  Good peripheral circulation.  Respiratory: Normal respiratory effort without tachypnea or retractions. Lungs CTAB. Good air entry to the bases with no decreased or absent breath sounds. Musculoskeletal: Full range of motion to all extremities. No gross deformities appreciated. Neurologic:  Normal speech and language. No gross focal neurologic deficits are appreciated.  Skin:  Skin is warm, dry and intact. Scattered papular rash to the forehead, bilateral periorbital region, bilateral cheeks. No cellulitis. No wheals or hives. Area is nontender to palpation. No loosening of the dermis Psychiatric: Mood and affect are normal. Speech and behavior are normal. Patient exhibits appropriate insight and judgement.   ____________________________________________   LABS (all labs ordered are listed, but only abnormal results are displayed)  Labs Reviewed - No data to display ____________________________________________  EKG   ____________________________________________  RADIOLOGY   No results found.  ____________________________________________    PROCEDURES  Procedure(s) performed:    Procedures    Medications - No data to  display   ____________________________________________   INITIAL IMPRESSION / ASSESSMENT AND PLAN / ED COURSE  Pertinent labs & imaging results that were available during my care of the patient were reviewed by me and considered in my medical decision making (see chart for details).  Review of the [place] CSRS was performed in accordance of the [place] prior to dispensing any controlled drugs.     Patient's diagnosis is consistent with rash of bilateral cheeks, periorbital regions, forehead. Patient presents with a scattered papular rash to the above locations. This rash has the appearance of folliculitis with no surrounding cellulitis. However, patient has recently started rifampin. No sign of TENs or Stevens-Johnson syndrome. Due to the history, I contacted infectious disease provider. Dr. Ola Spurr recommends that rash is most likely consistent with adverse effect of rifampin. He also recommends discontinuing rifampin and placing patient on doxycycline for a possible folliculitis. Patient will follow up without department for further management of late tuberculosis.. Patient will be discharged home with prescriptions for doxycycline.  Patient is given ED precautions to return to the ED for any worsening or new symptoms.     ____________________________________________  FINAL CLINICAL IMPRESSION(S) / ED DIAGNOSES  Final diagnoses:  Rash and nonspecific skin eruption      NEW MEDICATIONS STARTED DURING THIS VISIT:  Discharge Medication List as of 02/19/2017  9:10 PM    START taking these medications   Details  doxycycline (VIBRA-TABS) 100 MG tablet Take 1 tablet (100 mg total) by mouth 2 (two) times daily., Starting Wed 02/19/2017, Print            This chart was dictated using voice recognition software/Dragon. Despite best efforts to proofread, errors can occur which can change the meaning. Any change was purely unintentional.    Darletta Moll, PA-C 02/20/17  0050    Darel Hong, MD 02/21/17 1432

## 2017-02-19 NOTE — ED Triage Notes (Signed)
Pt to ED reporting sudden onset of red raised rash on face today. Pt has multiple small bumps across nose and checks. PT reports the rash is not painful but is itchy at this time. Pt denies SOB or feeling as though her mouth or throat is swelling. No medication taken at home.   Pt reports she tested positive for the "TB germ" but was told she did not have TB. Pt reports PCP placed her on Rifampin Monday. No side effects reported prior to today.

## 2017-02-24 ENCOUNTER — Other Ambulatory Visit: Payer: Commercial Managed Care - PPO

## 2017-03-03 ENCOUNTER — Ambulatory Visit (INDEPENDENT_AMBULATORY_CARE_PROVIDER_SITE_OTHER): Payer: Commercial Managed Care - PPO | Admitting: Family Medicine

## 2017-03-03 ENCOUNTER — Encounter: Payer: Self-pay | Admitting: Family Medicine

## 2017-03-03 VITALS — BP 109/66 | HR 87 | Temp 98.4°F | Resp 16 | Ht <= 58 in | Wt 136.0 lb

## 2017-03-03 DIAGNOSIS — E1169 Type 2 diabetes mellitus with other specified complication: Secondary | ICD-10-CM | POA: Diagnosis not present

## 2017-03-03 DIAGNOSIS — IMO0001 Reserved for inherently not codable concepts without codable children: Secondary | ICD-10-CM

## 2017-03-03 DIAGNOSIS — I10 Essential (primary) hypertension: Secondary | ICD-10-CM | POA: Diagnosis not present

## 2017-03-03 DIAGNOSIS — R799 Abnormal finding of blood chemistry, unspecified: Secondary | ICD-10-CM | POA: Diagnosis not present

## 2017-03-03 DIAGNOSIS — Z0000 Encounter for general adult medical examination without abnormal findings: Secondary | ICD-10-CM | POA: Diagnosis not present

## 2017-03-03 DIAGNOSIS — E1165 Type 2 diabetes mellitus with hyperglycemia: Secondary | ICD-10-CM | POA: Diagnosis not present

## 2017-03-03 DIAGNOSIS — L853 Xerosis cutis: Secondary | ICD-10-CM | POA: Diagnosis not present

## 2017-03-03 DIAGNOSIS — E785 Hyperlipidemia, unspecified: Secondary | ICD-10-CM

## 2017-03-03 DIAGNOSIS — G5621 Lesion of ulnar nerve, right upper limb: Secondary | ICD-10-CM | POA: Diagnosis not present

## 2017-03-03 NOTE — Progress Notes (Signed)
Subjective:    Patient ID: Cheryl Roberts, female    DOB: [birth year], 46 y.o.   MRN: [id number]  Cheryl Roberts is a 46 y.o. female presenting on 03/03/2017 for Annual Exam   HPI   Here for Annual Physical - due for labs, will return in near future.  Right Hand 4th/5th digit numbness / History of Carpal Tunnel - Reports history of bilateral L > R carpal tunnel in past, no significant prior intervention. Now admits some gradual worsening intermittent problem R hand with some numbness episodes and paresthesia in 4th and 5th fingers. No significant provoking activities - Taking some Diclofenac in past but now stopped due to concern side effect - No longer on Meloxicam and Naproxen - Has L wrist splint, but not wearing R wrist splint  CHRONIC DM, Type 2: - Last visit with me 12/16/16, for same problem, treated with new start SGLT2 initially [place] but due to ins coverage switch to Jardiance, see prior notes for background information. - Today patient reports believes CBG improved CBGs: Avg 130-150s, low >100, high < 200 - checks x 2 daily Meds: Metformin XR 713m daily, Jardiance 137mdaily - Admits some Left foot pain uncertain duration, has history of other orthopedic concerns, see below Currently on Aspirin, Atorvastatin Lifestyle: - Diet: Increased vegetables, greens, still lower carb diet - Exercise: Increasing her regular walking few times weekly  HYPERLIPIDEMIA: - Reports no concerns. Last lipid panel 02/2016, mostly controlled  - Currently taking Atorvastatin 4083mtolerating well without side effects or myalgias - Taking ASA 81  Additional history - Followed by Ortho in past for chronic neck pain, and LUE symptoms, initially workers comp case then dropped, now recently seen by me with referral in 01/2017, due to concern cervical radiculopathy, [facility]  Health Maintenance: - Due for Flu Vaccine, will get at work, in  October   [facility] 03/03/2017 05/31/2016 02/09/2016  Decreased Interest 0 0 0  Down, Depressed, Hopeless 1 0 0  PHQ - 2 Score 1 0 0  Difficult doing work/chores Not difficult at all - -    Past Medical History:  Diagnosis Date  . Asthma   . Cervical radiculopathy   . Chronic sinusitis   . [place] Hyperlipidemia   . Hypertension    No past surgical history on file. Social History   Social History  . Marital status: Single    Spouse name: N/A  . Number of children: N/A  . Years of education: N/A   Occupational History  . Not on file.   Social History Main Topics  . Smoking status: Never Smoker  . Smokeless tobacco: Never Used  . Alcohol use Yes     Comment: occas wine or beer  . Drug use: No  . Sexual activity: No   Other Topics Concern  . Not on file   Social History Narrative  . No narrative on file   Family History  Problem Relation Age of Onset  . Hypertension Mother   . Hyperthyroidism Mother   . Gout Mother   . Diabetes Mother   . Arthritis Mother   . Cancer Father    Current Outpatient Prescriptions on File Prior to Visit  Medication Sig  . albuterol (PROVENTIL HFA;VENTOLIN HFA) 108 (90 Base) MCG/ACT inhaler Inhale 2 puffs into the lungs every 2 (two) hours as needed for wheezing or shortness of breath (cough).  .Marland Kitchen  aspirin 81 MG tablet Take 1 tablet (81 mg total) by mouth daily.  Marland Kitchen atorvastatin (LIPITOR) 40 MG tablet Take 1 tablet (40 mg total) by mouth daily.  . baclofen (LIORESAL) 10 MG tablet Take 0.5-1 tablets (5-10 mg total) by mouth 3 (three) times daily as needed for muscle spasms.  . Blood Glucose Monitoring Suppl (TRUE METRIX METER) w/Device KIT 1 each by Does not apply route as directed.  . empagliflozin (JARDIANCE) 10 MG TABS tablet Take 10 mg by mouth daily.  . fluticasone (FLONASE) 50 MCG/ACT nasal spray Place 1 spray into both nostrils daily. For 2 to 4 weeks then as needed.  Marland Kitchen  glucose blood (TRUE METRIX BLOOD GLUCOSE TEST) test strip Check blood sugar twice daily. Dx E11.9  . hydrochlorothiazide (HYDRODIURIL) 25 MG tablet TAKE ONE TABLET BY MOUTH EVERY DAY  . ibuprofen (ADVIL,MOTRIN) 600 MG tablet Take 1 tablet (600 mg total) by mouth every 8 (eight) hours as needed.  Marland Kitchen ipratropium (ATROVENT) 0.06 % nasal spray Place 2 sprays into both nostrils 4 (four) times daily. For up to 5-7 days then stop.  Marland Kitchen LANCETS ULTRA FINE MISC 1 each by Does not apply route 2 (two) times daily. Check blood sugar twice daily. Dx: E11.9  . lisinopril (PRINIVIL,ZESTRIL) 10 MG tablet TAKE ONE TABLET BY MOUTH EVERY DAY  . loratadine (CLARITIN) 10 MG tablet Take 1 tablet (10 mg total) by mouth daily.  . metFORMIN (GLUCOPHAGE-XR) 750 MG 24 hr tablet Take 1 tablet (750 mg total) by mouth daily with breakfast.   No current facility-administered medications on file prior to visit.     Review of Systems  Constitutional: Negative for activity change, appetite change, chills, diaphoresis, fatigue, fever and unexpected weight change.  HENT: Negative for congestion and hearing loss.   Eyes: Negative for visual disturbance.  Respiratory: Negative for apnea, cough, chest tightness, shortness of breath and wheezing.   Cardiovascular: Negative for chest pain, palpitations and leg swelling.  Gastrointestinal: Negative for abdominal pain, anal bleeding, blood in stool, constipation, diarrhea, nausea and vomiting.  Endocrine: Negative for cold intolerance and polyuria.  Genitourinary: Negative for difficulty urinating, dysuria, frequency, hematuria and urgency.  Musculoskeletal: Positive for neck pain. Negative for arthralgias and back pain.  Skin: Negative for rash.  Allergic/Immunologic: Negative for environmental allergies.  Neurological: Positive for numbness (intermittent rarely R ulnar, also history of LUE intermittent). Negative for dizziness, tremors, weakness, light-headedness and headaches.   Hematological: Negative for adenopathy.  Psychiatric/Behavioral: Negative for behavioral problems, dysphoric mood and sleep disturbance. The patient is not nervous/anxious.    Per HPI unless specifically indicated above     Objective:    BP 109/66   Pulse 87   Temp 98.4 F (36.9 C) (Oral)   Resp 16   Ht '4\' 9"'  (1.448 m)   Wt 136 lb (61.7 kg)   BMI 29.43 kg/m   Wt Readings from Last 3 Encounters:  03/03/17 136 lb (61.7 kg)  02/19/17 140 lb (63.5 kg)  02/02/17 140 lb (63.5 kg)    Physical Exam  Constitutional: She is oriented to person, place, and time. She appears well-developed and well-nourished. No distress.  Well-appearing, comfortable, cooperative  HENT:  Head: Normocephalic and atraumatic.  Mouth/Throat: Oropharynx is clear and moist.  Eyes: Conjunctivae are normal. Right eye exhibits no discharge. Left eye exhibits no discharge.  Neck: Normal range of motion. Neck supple. No thyromegaly present.  Cardiovascular: Normal rate, regular rhythm, normal heart sounds and intact distal pulses.   No  murmur heard. Pulmonary/Chest: Effort normal and breath sounds normal. No respiratory distress. She has no wheezes. She has no rales.  Musculoskeletal: Normal range of motion. She exhibits no edema.  Right Hand/Wrist Inspection: Normal appearance, symmetrical, no bulky MCP joints, no edema or erythema. Palpation: Non tender hand / wrist, carpal bones, including MCP, base of thumb. No distinct anatomical snuff box or scaphoid tenderness.  ROM: full active wrist ROM flex / ext, ulnar / radial deviation Special Testing: Negative Tinel's median and ulnar nerve Right Strength: 5/5 grip, thumb opposition, wrist flex/ext Neurovascular: distally intact   Lymphadenopathy:    She has no cervical adenopathy.  Neurological: She is alert and oriented to person, place, and time.  Skin: Skin is warm and dry. No rash noted. She is not diaphoretic. No erythema.  Bilateral feet with significant  dry skin around skin folds and creases  Psychiatric: She has a normal mood and affect. Her behavior is normal.  Well groomed, good eye contact, normal speech and thoughts  Nursing note and vitals reviewed.   Diabetic Foot Exam - Simple   Simple Foot Form Diabetic Foot exam was performed with the following findings:  Yes 03/03/2017  3:28 PM  Visual Inspection See comments:  Yes Sensation Testing Intact to touch and monofilament testing bilaterally:  Yes Pulse Check Posterior Tibialis and Dorsalis pulse intact bilaterally:  Yes Comments No ulceration or skin breakdown. Some mild dry skin especially adjacent to toes including great toe plantar aspect.     Results for orders placed or performed in visit on 12/16/16  POCT glycosylated hemoglobin (Hb A1C)  Result Value Ref Range   Hemoglobin A1C 9.8       Assessment & Plan:   Problem List Items Addressed This Visit    Hyperlipidemia associated with type 2 diabetes mellitus ([place])    Due for future fasting lipids in 6 weeks Will adjust statin accordingly Continue Atorvastatin 74m daily for now      Relevant Orders   Lipid panel   TSH   Hypertension    Well-controlled HTN - Home BP readings improved  No known complications / Possible Lisinopril cough, unsure if still taking  Plan:  1. Continue current BP regimen - HCTZ 24mdaily, possibly Lisinopril daily as well 2. Encourage improved lifestyle - low sodium diet, regular exercise 3. Continue monitor BP outside office, bring readings to next visit, if persistently >140/90 or new symptoms notify office sooner 4. 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 Seems to be wrist based on activities  Plan: 1. Trial on wrist splint, support during work and at night 2. May take NSAID PRN 3. Follow-up if not improved       Relevant Medications   cyclobenzaprine (FLEXERIL) 10 MG tablet   Uncontrolled diabetes mellitus type 2 [place] complications [place]   Not due yet for A1c, still uncontrolled DM with last A1c 9.8 (worsening control from 8.3, prior 9 overall raising trend) - Reassuring with history CBG seems improved No known complications or hypoglycemia.  Plan: 1. Continue Jardiance 1070maily, Metformin XR 750m35mily - future consider double dose metformin and or jardiance 3. Encourage improved lifestyle - low carb, low sugar diet, reduce portion size, continue improving regular exercise with increased walking 3. Check CBG still, bring  log to next visit for review 4. Continue ASA, Statin 5. Will need DM eye exam [facility] 04/2017 6. Follow-up 6 wk for DM A1c      Relevant Orders   Hemoglobin A1c    Other Visit Diagnoses    Annual physical exam    -  Primary   Relevant Orders   COMPLETE METABOLIC PANEL WITH GFR   Hemoglobin A1c   Lipid panel   CBC with Differential/Platelet   Dry skin dermatitis       Referral to [facility] for dry skin on feet, refractory to most conservative therapy, no ulceration but at risk.   Relevant Orders   Ambulatory referral to Podiatry   Abnormal blood chemistry       Relevant Orders   CBC with Differential/Platelet      Meds ordered this encounter  Medications  . cyclobenzaprine (FLEXERIL) 10 MG tablet    Sig: Take by mouth.    Follow up plan: Return in about 2 weeks (around 03/19/2017) for fasting lab only.  Nobie Putnam, [facility] 03/03/2017, 11:55 PM

## 2017-03-03 NOTE — Patient Instructions (Addendum)
Thank you for coming to the clinic today.  1. For Dry skin on feet - follow-up with Podiatrist  PODIATRY  [address] [place], [place] [address] Hours - M-F 8-5 Phone: [phone] phone  2.   DUE for FASTING BLOOD WORK (no food or drink after midnight before the lab appointment, only water or coffee without cream/sugar on the morning of)  SCHEDULE "Lab Only" visit in the morning at the clinic for lab draw in 3 WEEKS  - Make sure Lab Only appointment is at about 1 week before your next appointment, so that results will be available  For Lab Results, once available within 2-3 days of blood draw, you can can log in to MyChart online to view your results and a brief explanation. Also, we can discuss results at next follow-up visit.  Please schedule a Follow-up Appointment to: Return in about 2 weeks (around 03/19/2017) for fasting lab only.  If you have any other questions or concerns, please feel free to call the clinic or send a message through MyChart. You may also schedule an earlier appointment if necessary.  Additionally, you may be receiving a survey about your experience at our clinic within a few days to 1 week by e-mail or mail. We value your feedback.  [name], DO [facility], [place]

## 2017-03-03 NOTE — Assessment & Plan Note (Signed)
Well-controlled HTN - Home BP readings improved  No known complications / Possible Lisinopril cough, unsure if still taking  Plan:  1. Continue current BP regimen - HCTZ  daily, possibly Lisinopril daily as well 2. Encourage improved lifestyle - low sodium diet, regular exercise 3. Continue monitor BP outside office, bring readings to next visit, if persistently >140/90 or new symptoms notify office sooner 4. Follow-up q 3 mo

## 2017-03-03 NOTE — Assessment & Plan Note (Signed)
Due for future fasting lipids in 6 weeks Will adjust statin accordingly Continue Atorvastatin  daily for now

## 2017-03-03 NOTE — Assessment & Plan Note (Signed)
Not due yet for A1c, still uncontrolled DM with last A1c 9.8 (worsening control from 8.3, prior 9 overall raising trend) - Reassuring with history [id number]$10mgconsider double dose metformin and or jardiance 3. Encourage improved lifestyle - low carb, low sugar diet, reduce portion size, continue improving regular exercise with increased walking 3. Check CBG still, bring log to next visit for review 4. Continue ASA, Statin 5. Will need DM eye exam [facility] 04/2017 6. Follow-up 6 wk for DM A1c

## 2017-03-03 NOTE — Assessment & Plan Note (Signed)
Presumed R ulnar nerve symptoms by history, not provoked on exam, less likely elbow. Seems to be wrist based on activities  Plan: 1. Trial on wrist splint, support during work and at night 2. May take NSAID PRN 3. Follow-up if not improved

## 2017-03-11 ENCOUNTER — Other Ambulatory Visit: Payer: Commercial Managed Care - PPO

## 2017-03-17 ENCOUNTER — Other Ambulatory Visit: Payer: Self-pay | Admitting: Family Medicine

## 2017-03-17 DIAGNOSIS — E1169 Type 2 diabetes mellitus with other specified complication: Secondary | ICD-10-CM

## 2017-03-17 DIAGNOSIS — E785 Hyperlipidemia, unspecified: Principal | ICD-10-CM

## 2017-03-17 MED ORDER — ATORVASTATIN CALCIUM 40 MG PO TABS: 40.0000 mg | ORAL_TABLET | Freq: Every day | ORAL | 11 refills | Status: AC

## 2017-03-18 ENCOUNTER — Ambulatory Visit: Payer: Commercial Managed Care - PPO | Admitting: Family Medicine

## 2017-03-19 ENCOUNTER — Other Ambulatory Visit: Payer: Self-pay | Admitting: Physician Assistant

## 2017-03-19 DIAGNOSIS — M5030 Other cervical disc degeneration, unspecified cervical region: Secondary | ICD-10-CM

## 2017-03-25 ENCOUNTER — Other Ambulatory Visit: Payer: Commercial Managed Care - PPO

## 2017-03-25 DIAGNOSIS — R799 Abnormal finding of blood chemistry, unspecified: Secondary | ICD-10-CM

## 2017-03-25 DIAGNOSIS — E1165 Type 2 diabetes mellitus with hyperglycemia: Secondary | ICD-10-CM

## 2017-03-25 DIAGNOSIS — E785 Hyperlipidemia, unspecified: Secondary | ICD-10-CM

## 2017-03-25 DIAGNOSIS — I10 Essential (primary) hypertension: Secondary | ICD-10-CM

## 2017-03-25 DIAGNOSIS — E1169 Type 2 diabetes mellitus with other specified complication: Secondary | ICD-10-CM

## 2017-03-25 DIAGNOSIS — IMO0001 Reserved for inherently not codable concepts without codable children: Secondary | ICD-10-CM

## 2017-03-25 DIAGNOSIS — Z0000 Encounter for general adult medical examination without abnormal findings: Secondary | ICD-10-CM

## 2017-03-26 LAB — CBC WITH DIFFERENTIAL/PLATELET
BASOS ABS: 29 {cells}/uL (ref 0–200)
Basophils Relative: 0.5 %
EOS PCT: 1.2 %
Eosinophils Absolute: 70 cells/uL (ref 15–500)
HCT: 40.1 % (ref 35.0–45.0)
HEMOGLOBIN: 13 g/dL (ref 11.7–15.5)
Lymphs Abs: 2517 cells/uL (ref 850–3900)
MCH: 25.4 pg — AB (ref 27.0–33.0)
MCHC: 32.4 g/dL (ref 32.0–36.0)
MCV: 78.5 fL — ABNORMAL LOW (ref 80.0–100.0)
MPV: 10.5 fL (ref 7.5–12.5)
Monocytes Relative: 7.8 %
Neutro Abs: 2732 cells/uL (ref 1500–7800)
Neutrophils Relative %: 47.1 %
PLATELETS: 273 10*3/uL (ref 140–400)
RBC: 5.11 10*6/uL — ABNORMAL HIGH (ref 3.80–5.10)
RDW: 13 % (ref 11.0–15.0)
TOTAL LYMPHOCYTE: 43.4 %
WBC: 5.8 10*3/uL (ref 3.8–10.8)
WBCMIX: 452 {cells}/uL (ref 200–950)

## 2017-03-26 LAB — COMPLETE METABOLIC PANEL WITH GFR
AG Ratio: 1.7 (calc) (ref 1.0–2.5)
ALBUMIN MSPROF: 4.2 g/dL (ref 3.6–5.1)
ALT: 22 U/L (ref 6–29)
AST: 20 U/L (ref 10–35)
Alkaline phosphatase (APISO): 51 U/L (ref 33–115)
BILIRUBIN TOTAL: 0.3 mg/dL (ref 0.2–1.2)
BUN: 13 mg/dL (ref 7–25)
CALCIUM: 9.5 mg/dL (ref 8.6–10.2)
CHLORIDE: 101 mmol/L (ref 98–110)
CO2: 29 mmol/L (ref 20–32)
CREATININE: 0.68 mg/dL (ref 0.50–1.10)
GFR, EST AFRICAN AMERICAN: 122 mL/min/{1.73_m2} (ref >=60)
GFR, Est Non African American: 106 mL/min/{1.73_m2} (ref >=60)
GLUCOSE: 129 mg/dL — AB (ref 65–99)
Globulin: 2.5 g/dL (calc) (ref 1.9–3.7)
Potassium: 4.1 mmol/L (ref 3.5–5.3)
Sodium: 139 mmol/L (ref 135–146)
TOTAL PROTEIN: 6.7 g/dL (ref 6.1–8.1)

## 2017-03-26 LAB — HEMOGLOBIN A1C
Hgb A1c MFr Bld: 8.4 % of total Hgb — ABNORMAL HIGH (ref <5.7)
MEAN PLASMA GLUCOSE: 194 (calc)
eAG (mmol/L): 10.8 (calc)

## 2017-03-26 LAB — LIPID PANEL
CHOL/HDL RATIO: 4.8 (calc) (ref <5.0)
Cholesterol: 176 mg/dL (ref <200)
HDL: 37 mg/dL — ABNORMAL LOW (ref >50)
LDL CHOLESTEROL (CALC): 113 mg/dL — AB
Non-HDL Cholesterol (Calc): 139 mg/dL (calc) — ABNORMAL HIGH (ref <130)
TRIGLYCERIDES: 146 mg/dL (ref <150)

## 2017-03-26 LAB — TSH: TSH: 4.74 m[IU]/L — AB

## 2017-03-27 ENCOUNTER — Ambulatory Visit: Payer: Commercial Managed Care - PPO

## 2017-03-28 ENCOUNTER — Ambulatory Visit (INDEPENDENT_AMBULATORY_CARE_PROVIDER_SITE_OTHER): Payer: Commercial Managed Care - PPO | Admitting: Family Medicine

## 2017-03-28 ENCOUNTER — Encounter: Payer: Self-pay | Admitting: Family Medicine

## 2017-03-28 VITALS — BP 125/73 | HR 85 | Temp 99.1°F | Resp 16 | Ht <= 58 in | Wt 140.0 lb

## 2017-03-28 DIAGNOSIS — H65491 Other chronic nonsuppurative otitis media, right ear: Secondary | ICD-10-CM | POA: Diagnosis not present

## 2017-03-28 DIAGNOSIS — H9201 Otalgia, right ear: Secondary | ICD-10-CM

## 2017-03-28 MED ORDER — PREDNISONE 10 MG PO TABS: 30.0000 mg | ORAL_TABLET | Freq: Every day | ORAL | 0 refills | Status: DC

## 2017-03-28 MED ORDER — LEVOFLOXACIN 500 MG PO TABS: 500.0000 mg | ORAL_TABLET | Freq: Every day | ORAL | 0 refills | Status: DC

## 2017-03-28 NOTE — Progress Notes (Signed)
Subjective:    Patient ID: Cheryl Roberts, female    DOB: 09-05-1970, 46 y.o.   MRN: [id number]  Cheryl Roberts is a 46 y.o. female presenting on 03/28/2017 for Ear Pain (Right side onset 4 days)  Patient presents for a same day appointment. Note patient was 15 minutes late, and we agreed to work her into schedule.  HPI   RIGHT EAR PAIN, Acute - Reports symptoms started 4 days ago with aching Right ear, seems a little bit worse, with constant aching. Only thing that helps is Mobic with temporary relief. She tried BC powder, Tylenol without relief. - No sick symptoms. Known history of chronic sinusitis recurrent flares, similar problem 1 year ago in 03/2016, treated with Flonase and allergy meds with gradual improvement. Also history of impacted cerumen in past - No reduced hearing - [facility] ago she had procedure for tubes and had similar problem, [facility] - Denies fevers/chills, sweats, nausea vomiting headache, ear drainage, hearing loss, sinus pain or pressure, sinus purulence, cough  Health Maintenance: - Declined Flu Shot, will get through work  [facility] 03/03/2017 05/31/2016 02/09/2016  Decreased Interest 0 0 0  Down, Depressed, Hopeless 1 0 0  PHQ - 2 Score 1 0 0  Difficult doing work/chores Not difficult at all - -    Social History  Substance Use Topics  . Smoking status: Never Smoker  . Smokeless tobacco: Never Used  . Alcohol use Yes     Comment: occas wine or beer    Review of Systems Per HPI unless specifically indicated above     Objective:    BP 125/73   Pulse 85   Temp 99.1 F (37.3 C) (Oral)   Resp 16   Ht 4\' 9"  (1.448 m)   Wt 140 lb (63.5 kg)   BMI 30.30 kg/m   Wt Readings from Last 3 Encounters:  03/28/17 140 lb (63.5 kg)  03/03/17 136 lb (61.7 kg)  02/19/17 140 lb (63.5 kg)    Physical Exam  Constitutional: She appears well-developed and well-nourished. No distress.    Well-appearing, comfortable, cooperative  HENT:  Head: Normocephalic and atraumatic.  Mouth/Throat: Oropharynx is clear and moist.  Frontal / maxillary sinuses non-tender. No facial edema or erythema. Nares patent without purulence or edema. Bilateral mastoid process and external ears tragus non tender. Bilateral R>L ear with opaque effusion without erythema or bulging, no canal deformity except limited space in ear canals, no cerumen impacting. Oropharynx clear without erythema, exudates, edema or asymmetry.  Eyes: Conjunctivae are normal. Right eye exhibits no discharge. Left eye exhibits no discharge.  Neck: Normal range of motion. Neck supple. No thyromegaly present.  Cardiovascular: Normal rate, regular rhythm, normal heart sounds and intact distal pulses.   No murmur heard. Pulmonary/Chest: Effort normal and breath sounds normal. No respiratory distress. She has no wheezes. She has no rales.  Musculoskeletal: Normal range of motion. She exhibits no edema.  Lymphadenopathy:    She has no cervical adenopathy.  Neurological: She is alert.  Skin: Skin is warm and dry. No rash noted. She is not diaphoretic. No erythema.  Nursing note and vitals reviewed.  Results for orders placed or performed in visit on 03/25/17  COMPLETE METABOLIC PANEL WITH GFR  Result Value Ref Range   Glucose, Bld 129 (H) 65 - 99 mg/dL   BUN 13 7 - 25 mg/dL   Creat 7.820.68 9.560.50 -  1.10 mg/dL   GFR, Est Non African American 106 > OR = 60 mL/min/1.21m2   GFR, Est African American 122 > OR = 60 mL/min/1.36m2   BUN/Creatinine Ratio NOT APPLICABLE 6 - 22 (calc)   Sodium 139 135 - 146 mmol/L   Potassium 4.1 3.5 - 5.3 mmol/L   Chloride 101 98 - 110 mmol/L   CO2 29 20 - 32 mmol/L   Calcium 9.5 8.6 - 10.2 mg/dL   Total Protein 6.7 6.1 - 8.1 g/dL   Albumin 4.2 3.6 - 5.1 g/dL   Globulin 2.5 1.9 - 3.7 g/dL (calc)   AG Ratio 1.7 1.0 - 2.5 (calc)   Total Bilirubin 0.3 0.2 - 1.2 mg/dL   Alkaline phosphatase (APISO) 51 33 -  115 U/L   AST 20 10 - 35 U/L   ALT 22 6 - 29 U/L  Hemoglobin A1c  Result Value Ref Range   Hgb A1c MFr Bld 8.4 (H) <5.7 % of total Hgb   Mean Plasma Glucose 194 (calc)   eAG (mmol/L) 10.8 (calc)  Lipid panel  Result Value Ref Range   Cholesterol 176 <200 mg/dL   HDL 37 (L) >16 mg/dL   Triglycerides 109 <604 mg/dL   LDL Cholesterol (Calc) 113 (H) mg/dL (calc)   Total CHOL/HDL Ratio 4.8 <5.0 (calc)   Non-HDL Cholesterol (Calc) 139 (H) <130 mg/dL (calc)  CBC with Differential/Platelet  Result Value Ref Range   WBC 5.8 3.8 - 10.8 Thousand/uL   RBC 5.11 (H) 3.80 - 5.10 Million/uL   Hemoglobin 13.0 11.7 - 15.5 g/dL   HCT 54.0 98.1 - 19.1 %   MCV 78.5 (L) 80.0 - 100.0 fL   MCH 25.4 (L) 27.0 - 33.0 pg   MCHC 32.4 32.0 - 36.0 g/dL   RDW 47.8 29.5 - 62.1 %   Platelets 273 140 - 400 Thousand/uL   MPV 10.5 7.5 - 12.5 fL   Neutro Abs 2,732 1,500 - 7,800 cells/uL   Lymphs Abs 2,517 850 - 3,900 cells/uL   WBC mixed population 452 200 - 950 cells/uL   Eosinophils Absolute 70 15 - 500 cells/uL   Basophils Absolute 29 0 - 200 cells/uL   Neutrophils Relative % 47.1 %   Total Lymphocyte 43.4 %   Monocytes Relative 7.8 %   Eosinophils Relative 1.2 %   Basophils Relative 0.5 %  TSH  Result Value Ref Range   TSH 4.74 (H) mIU/L      Assessment & Plan:   Problem List Items Addressed This Visit    None    Visit Diagnoses    Right ear pain    -  Primary   Relevant Medications   levofloxacin (LEVAQUIN) 500 MG tablet   predniSONE (DELTASONE) 10 MG tablet   Other Relevant Orders   Ambulatory referral to ENT   Chronic middle ear effusion, right       Relevant Medications   levofloxacin (LEVAQUIN) 500 MG tablet   predniSONE (DELTASONE) 10 MG tablet   Other Relevant Orders   Ambulatory referral to ENT  Acute on chronic vs Recurrent R ear pain, concern eustachian tube dysfunction or other inner ear etiology with secondary R ear effusion, without loss of hearing or obvious evidence of AOM  (TM is opaque difficulty to tell if any purulence, but no erythema) or no sinusitis. Likely related allergic rhinosinusitis based on exam and history. - Failed Flonase  Plan: 1. Discussion on options - agree that patient may benefit from return to ENT  for re-eval may need tymp tube or other intervention. She requests return to previous [facility]NT in [place] at hospital - referral sent 2. For now will cover with empiric antibiotics - Levaquin 500mg  daily x 5 days for ear / sinus, note amoxicillin allergy. 3. For relief of effusion/pain/eustachian tube dysfunction trial on 3 day prednisone 30mg  daily x 3 days 4. Continue Flonase 2 sprays each nare daily for up to 4-6 weeks or longer 5. Continue OTC oral anti-histamine daily 6. Recommend may consider trial OTC oral decongestant for up to 1 week or less 7. Follow-up if not improved or worsening, return criteria       Meds ordered this encounter  Medications  . levofloxacin (LEVAQUIN) 500 MG tablet    Sig: Take 1 tablet (500 mg total) by mouth daily. For 5 days    Dispense:  5 tablet    Refill:  0  . predniSONE (DELTASONE) 10 MG tablet    Sig: Take 3 tablets (30 mg total) by mouth daily with breakfast. For 3 days    Dispense:  9 tablet    Refill:  0    Follow up plan: Return if symptoms worsen or fail to improve.  [name], DO [facility] 03/29/2017, 8:48 AM

## 2017-03-28 NOTE — Patient Instructions (Addendum)
Thank you for coming to the clinic today.  1. Referral to ENT for repeat evaluation, may need tubes or other treatment for R ear pain  [facility] [address] [facility] [place], [place] [address] [phone]  Start Levaquin antibiotic 500mg  daily x 5 days and Prednisone 30mg  (3 of the 10mg  tabs) for 3 days with food to help reduce pressure.  Continue current medicines.  Please schedule a Follow-up Appointment to: Return if symptoms worsen or fail to improve.  If you have any other questions or concerns, please feel free to call the clinic or send a message through MyChart. You may also schedule an earlier appointment if necessary.  Additionally, you may be receiving a survey about your experience at our clinic within a few days to 1 week by e-mail or mail. We value your feedback.  Saralyn PilarAlexander Meeka Cartelli, DO [name]

## 2017-03-31 ENCOUNTER — Other Ambulatory Visit: Payer: Self-pay | Admitting: Family Medicine

## 2017-03-31 DIAGNOSIS — IMO0001 Reserved for inherently not codable concepts without codable children: Secondary | ICD-10-CM

## 2017-03-31 DIAGNOSIS — E1165 Type 2 diabetes mellitus with hyperglycemia: Principal | ICD-10-CM

## 2017-03-31 MED ORDER — EMPAGLIFLOZIN 25 MG PO TABS: 25.0000 mg | ORAL_TABLET | Freq: Every day | ORAL | 11 refills | Status: DC

## 2017-04-06 ENCOUNTER — Encounter: Payer: Self-pay | Admitting: Emergency Medicine

## 2017-04-06 ENCOUNTER — Emergency Department
Admission: EM | Admit: 2017-04-06 | Discharge: 2017-04-06 | Disposition: A | Discharge status: Home / Self Care | Payer: Commercial Managed Care - PPO | Attending: Emergency Medicine | Admitting: Emergency Medicine

## 2017-04-06 DIAGNOSIS — H6981 Other specified disorders of Eustachian tube, right ear: Secondary | ICD-10-CM

## 2017-04-06 DIAGNOSIS — H6991 Unspecified Eustachian tube disorder, right ear: Secondary | ICD-10-CM | POA: Diagnosis not present

## 2017-04-06 DIAGNOSIS — H9201 Otalgia, right ear: Secondary | ICD-10-CM

## 2017-04-06 DIAGNOSIS — Z7984 Long term (current) use of oral hypoglycemic drugs: Secondary | ICD-10-CM | POA: Insufficient documentation

## 2017-04-06 DIAGNOSIS — H65491 Other chronic nonsuppurative otitis media, right ear: Secondary | ICD-10-CM

## 2017-04-06 DIAGNOSIS — E119 Type 2 diabetes mellitus without complications: Secondary | ICD-10-CM | POA: Diagnosis not present

## 2017-04-06 DIAGNOSIS — I10 Essential (primary) hypertension: Secondary | ICD-10-CM | POA: Diagnosis not present

## 2017-04-06 DIAGNOSIS — H6091 Unspecified otitis externa, right ear: Secondary | ICD-10-CM | POA: Insufficient documentation

## 2017-04-06 DIAGNOSIS — Z7982 Long term (current) use of aspirin: Secondary | ICD-10-CM | POA: Diagnosis not present

## 2017-04-06 DIAGNOSIS — J45909 Unspecified asthma, uncomplicated: Secondary | ICD-10-CM | POA: Diagnosis not present

## 2017-04-06 DIAGNOSIS — Z79899 Other long term (current) drug therapy: Secondary | ICD-10-CM | POA: Insufficient documentation

## 2017-04-06 DIAGNOSIS — H60501 Unspecified acute noninfective otitis externa, right ear: Secondary | ICD-10-CM

## 2017-04-06 LAB — GLUCOSE, CAPILLARY: Glucose-Capillary: 134 mg/dL — ABNORMAL HIGH (ref 65–99)

## 2017-04-06 MED ORDER — PREDNISONE 10 MG PO TABS: 30.0000 mg | ORAL_TABLET | Freq: Every day | ORAL | 0 refills | Status: DC

## 2017-04-06 MED ORDER — NEOMYCIN-POLYMYXIN-HC 3.5-10000-1 OT SOLN: 3.0000 [drp] | Freq: Three times a day (TID) | OTIC | 0 refills | Status: AC

## 2017-04-06 NOTE — ED Notes (Signed)
Patient is complaining of right ear pain x 2 weeks.  Patient states she saw her PCP approx. 1 week ago and was given antibiotics and steroids.  Patient states she finished the courses of both with no relief.  Patient denies nasal congestion at this time.  Patient is in no obvious distress at this time.

## 2017-04-06 NOTE — ED Notes (Signed)
Performed lavage to right ear per provider request.

## 2017-04-06 NOTE — ED Provider Notes (Signed)
[facility] Emergency Department Provider Note  ____________________________________________   First MD Initiated Contact with Patient 04/06/17 0932     (approximate)  I have reviewed the triage vital signs and the nursing notes.   HISTORY  Chief Complaint Otalgia  HPI Cheryl Roberts is a 46 y.o. female this still complained of right ear pain for 1 week. Patient states that she saw her PCP and was prescribed antibiotics and steroids but her ear pain is no better. She also has an appointment with her ENT at [facility] on November 5. She has had a history of "inner ear infections". Patient has completed a 5 day course of Levaquin and a three-day course of prednisone.patient denies any fever. She states there has been talk of tubes for years at [facility] rates her pain as a 10 over 10.   Past Medical History:  Diagnosis Date  . Asthma   . Cervical radiculopathy   . Chronic sinusitis   . Diabetes mellitus without complication ([place])   . Hyperlipidemia   . Hypertension     Patient Active Problem List   Diagnosis Date Noted  . 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 Cervical radiculopathy 01/17/2017  . Chronic low back pain without sciatica 06/22/2016  . Strain of left trapezius muscle 06/22/2016  . Allergic rhinitis due to allergen 03/26/2016  . Hyperlipidemia associated with type 2 diabetes mellitus ([place]) 03/15/2016  . Hypertension 02/09/2016  . Uncontrolled diabetes mellitus type 2 without complications ([place]) 24/40/1027  . DJD (degenerative joint disease) of cervical spine 02/09/2016  . Obesity (BMI 30.0-34.9) 02/09/2016  . Asthma, mild intermittent 02/09/2016  . Sialoadenitis 02/28/2003    History reviewed. No pertinent surgical history.  Prior to Admission medications   Medication Sig Start Date End Date Taking? Authorizing Provider  albuterol (PROVENTIL HFA;VENTOLIN HFA) 108 (90 Base) MCG/ACT inhaler Inhale 2 puffs  into the lungs every 2 (two) hours as needed for wheezing or shortness of breath (cough). 02/09/16   Karamalegos, Devonne Doughty, DO  aspirin 81 MG tablet Take 1 tablet (81 mg total) by mouth daily. 03/15/16   Karamalegos, Devonne Doughty, DO  atorvastatin (LIPITOR) 40 MG tablet Take 1 tablet (40 mg total) by mouth daily. 03/17/17   Karamalegos, Devonne Doughty, DO  baclofen (LIORESAL) 10 MG tablet Take 0.5-1 tablets (5-10 mg total) by mouth 3 (three) times daily as needed for muscle spasms. 01/17/17   Karamalegos, Devonne Doughty, DO  Blood Glucose Monitoring Suppl (TRUE METRIX METER) w/Device KIT 1 each by Does not apply route as directed. 05/29/16   Karamalegos, Devonne Doughty, DO  cyclobenzaprine (FLEXERIL) 10 MG tablet Take by mouth. 08/23/16   [provider]  empagliflozin (JARDIANCE) 25 MG TABS tablet Take 25 mg by mouth daily. 03/31/17   Karamalegos, Devonne Doughty, DO  fluticasone (FLONASE) 50 MCG/ACT nasal spray Place 1 spray into both nostrils daily. For 2 to 4 weeks then as needed. 03/26/16   Karamalegos, Devonne Doughty, DO  glucose blood (TRUE METRIX BLOOD GLUCOSE TEST) test strip Check blood sugar twice daily. Dx E11.9 05/29/16   Olin Hauser, DO  hydrochlorothiazide (HYDRODIURIL) 25 MG tablet TAKE ONE TABLET BY MOUTH EVERY DAY 01/31/17   Parks Ranger, Devonne Doughty, DO  ibuprofen (ADVIL,MOTRIN) 600 MG tablet Take 1 tablet (600 mg total) by mouth every 8 (eight) hours as needed. 04/12/16   Karamalegos, Devonne Doughty, DO  ipratropium (ATROVENT) 0.06 % nasal spray Place 2 sprays into both nostrils 4 (four)  times daily. For up to 5-7 days then stop. 08/23/16   Karamalegos, Alexander J, DO  LANCETS ULTRA FINE MISC 1 each by Does not apply route 2 (two) times daily. Check blood sugar twice daily. Dx: E11.9 05/29/16   Karamalegos, Alexander J, DO  lisinopril (PRINIVIL,ZESTRIL) 10 MG tablet TAKE ONE TABLET BY MOUTH EVERY DAY 01/31/17   Karamalegos, Alexander J, DO  loratadine (CLARITIN) 10 MG tablet Take 1  tablet (10 mg total) by mouth daily. 03/26/16   Karamalegos, Alexander J, DO  metFORMIN (GLUCOPHAGE-XR) 750 MG 24 hr tablet Take 1 tablet (750 mg total) by mouth daily with breakfast. 08/23/16   Karamalegos, Alexander J, DO  neomycin-polymyxin-hydrocortisone (CORTISPORIN) OTIC solution Place 3 drops into the left ear 3 (three) times daily. 04/06/17 04/16/17  Summers, Rhonda L, PA-C  predniSONE (DELTASONE) 10 MG tablet Take 3 tablets (30 mg total) by mouth daily with breakfast. For 3 days 04/06/17   Summers, Rhonda L, PA-C    Allergies Amoxicillin  Family History  Problem Relation Age of Onset  . Hypertension Mother   . Hyperthyroidism Mother   . Gout Mother   . Diabetes Mother   . Arthritis Mother   . Cancer Father     Social History Social History  Substance Use Topics  . Smoking status: Never Smoker  . Smokeless tobacco: Never Used  . Alcohol use Yes     Comment: occas wine or beer    Review of Systems Constitutional: No fever/chills ENT: No sore throat. Positive for ear pain. Cardiovascular: Denies chest pain. Respiratory: Denies shortness of breath. Genitourinary: Negative for dysuria. Musculoskeletal: Negative for back pain. Skin: Negative for rash. Neurological: Negative for headaches, focal weakness or numbness. ___________________________________________   PHYSICAL EXAM:  VITAL SIGNS: ED Triage Vitals  Enc Vitals Group     BP 04/06/17 0915 124/63     Pulse Rate 04/06/17 0915 86     Resp 04/06/17 0915 16     Temp 04/06/17 0915 98.4 F (36.9 C)     Temp Source 04/06/17 0915 Oral     SpO2 04/06/17 0915 98 %     Weight 04/06/17 0916 134 lb (60.8 kg)     Height 04/06/17 0916 4' 9" (1.448 m)     Head Circumference --      Peak Flow --      Pain Score --      Pain Loc --      Pain Edu? --      Excl. in GC? --    Constitutional: Alert and oriented. Well appearing and in no acute distress. Eyes: Conjunctivae are normal.  Head: Atraumatic. Nose: No  congestion/rhinnorhea.  Left TM is dull. Right EAC is partially occluded with cerumen. This was lavaged. There is some minimal exudate present with erythema. TM is without erythema or injection. Dull with poor light reflex. Patient is unable to open eustachian tube. Mouth/Throat: Mucous membranes are moist.  Oropharynx non-erythematous. Neck: No stridor.   Hematological/Lymphatic/Immunilogical: No cervical lymphadenopathy. Cardiovascular: Normal rate, regular rhythm. Grossly normal heart sounds.  Good peripheral circulation. Respiratory: Normal respiratory effort.  No retractions. Lungs CTAB. Gastrointestinal: Soft and nontender. No distention.  Neurologic:  Normal speech and language. No gross focal neurologic deficits are appreciated.  Psychiatric: Mood and affect are normal. Speech and behavior are normal.  ____________________________________________   LABS (all labs ordered are listed, but only abnormal results are displayed)  Labs Reviewed  GLUCOSE, CAPILLARY - Abnormal; Notable for the following:         Result Value   Glucose-Capillary 134 (*)    All other components within normal limits  CBG MONITORING, ED     PROCEDURES  Procedure(s) performed: None  Procedures  Critical Care performed: No  ____________________________________________   INITIAL IMPRESSION / ASSESSMENT AND PLAN / ED COURSE  Patient glucose fingerstick was 134 after having a [employer] peanut butter cup. Patient was placed on prednisone 30 mg for the next 3 days and is aware to watch her blood sugar and diet. She is also placed on Cortisporin otic suspension, she is to increase her Flonase from infrequently to 2 sprays to each nostril every day. She is also to obtain Sudafed at the pharmacy counter. I encouraged her to call her ENT office at [place] to see if she can be seen earlier.  ____________________________________________   FINAL CLINICAL IMPRESSION(S) / ED DIAGNOSES  Final diagnoses:  Acute  otitis externa of right ear, unspecified type  Eustachian tube dysfunction, right      NEW MEDICATIONS STARTED DURING THIS VISIT:  Discharge Medication List as of 04/06/2017 11:37 AM    START taking these medications   Details  neomycin-polymyxin-hydrocortisone (CORTISPORIN) OTIC solution Place 3 drops into the left ear 3 (three) times daily., Starting Sun 04/06/2017, Until Wed 04/16/2017, Print         Note:  This document was prepared using Dragon voice recognition software and may include unintentional dictation errors.    Summers, Rhonda L, PA-C 04/06/17 1336    Paduchowski, Kevin, MD 04/06/17 1408  

## 2017-04-06 NOTE — Discharge Instructions (Signed)
Begin taking prednisone for the next 3 days. Continue watching your blood sugar. Also begin using Cortisporin otic suspension to your right ear. Begin using Flonase nasal spray 2 sprays to each side of your nose every day. Obtain Sudafed at the pharmacy counter. This medication is not on the shelves and you will need to ask for it. You will also need to produce your driver's license when buying this medication.

## 2017-04-06 NOTE — ED Triage Notes (Signed)
Pt reports pain to her right ear for over a week. Reports was seen at her MD and prescribed antibiotics and steroids but her ear is no better. Pt reports ear is painful.

## 2017-04-08 ENCOUNTER — Telehealth: Payer: Self-pay | Admitting: Family Medicine

## 2017-04-08 MED ORDER — MELOXICAM 15 MG PO TABS: 15.0000 mg | ORAL_TABLET | Freq: Every day | ORAL | 0 refills | Status: DC

## 2017-04-08 NOTE — Telephone Encounter (Signed)
Pt received a fax from pharmacy that stated a medication needed authorization [id number]

## 2017-04-08 NOTE — Telephone Encounter (Signed)
[facility]  Saralyn PilarAlexander Karamalegos, DO [facility] 04/08/2017, 12:49 PM

## 2017-04-08 NOTE — Telephone Encounter (Signed)
Pt. Called requesting refill on  Medication for ear pain. Pt. Call back # is [phone]  Think pt  Have appt in [place] next week

## 2017-04-09 ENCOUNTER — Ambulatory Visit: Payer: Commercial Managed Care - PPO | Admitting: Family Medicine

## 2017-04-09 ENCOUNTER — Other Ambulatory Visit: Payer: Self-pay | Admitting: Family Medicine

## 2017-04-09 ENCOUNTER — Encounter: Payer: Self-pay | Admitting: Family Medicine

## 2017-04-09 DIAGNOSIS — H9201 Otalgia, right ear: Secondary | ICD-10-CM

## 2017-04-09 MED ORDER — HYDROCODONE-ACETAMINOPHEN 5-325 MG PO TABS: 1.0000 | ORAL_TABLET | Freq: Four times a day (QID) | ORAL | 0 refills | Status: DC | PRN

## 2017-04-09 NOTE — Progress Notes (Signed)
Called patient, see recent office visit 10/19 and ED visit for same problem acute R ear pain, refractory to antibiotics and other treatments including NSAID, Tylenol, Prednisone, Corticosporin ear drops, [facility]. Due to persistent pain difficulty sleeping at night, never been on stronger pain medicine, agreed to 1 time rx Hydrocodone-Acetaminophen 5/325 q 6 hr PRN #20 +0 refills printed she will pick up from office today with work note, this is 5 day supply.  Saralyn PilarAlexander Karamalegos, DO [facility] 04/09/2017, 12:18 PM

## 2017-05-06 ENCOUNTER — Encounter: Payer: Self-pay | Admitting: Family Medicine

## 2017-05-06 ENCOUNTER — Telehealth: Payer: Self-pay | Admitting: Family Medicine

## 2017-05-06 NOTE — Telephone Encounter (Signed)
Pt asked if she could get a note for job to work only 4 days a week due to pain until [place] .  Her call back number is [phone]

## 2017-05-06 NOTE — Telephone Encounter (Signed)
Pt is in office with her uncle today and will ask Dr. Kirtland BouchardK.

## 2017-06-06 ENCOUNTER — Ambulatory Visit: Payer: Commercial Managed Care - PPO | Admitting: Family Medicine

## 2017-06-11 ENCOUNTER — Encounter: Payer: Self-pay | Admitting: Family Medicine

## 2017-06-11 ENCOUNTER — Ambulatory Visit (INDEPENDENT_AMBULATORY_CARE_PROVIDER_SITE_OTHER): Payer: Commercial Managed Care - PPO | Admitting: Family Medicine

## 2017-06-11 VITALS — BP 120/74 | HR 69 | Temp 98.4°F | Resp 16 | Ht <= 58 in | Wt 137.5 lb

## 2017-06-11 DIAGNOSIS — E1165 Type 2 diabetes mellitus with hyperglycemia: Secondary | ICD-10-CM | POA: Diagnosis not present

## 2017-06-11 DIAGNOSIS — M26621 Arthralgia of right temporomandibular joint: Secondary | ICD-10-CM

## 2017-06-11 DIAGNOSIS — L819 Disorder of pigmentation, unspecified: Secondary | ICD-10-CM | POA: Diagnosis not present

## 2017-06-11 DIAGNOSIS — IMO0001 Reserved for inherently not codable concepts without codable children: Secondary | ICD-10-CM

## 2017-06-11 NOTE — Patient Instructions (Addendum)
Thank you for coming to the office today.  1.  Keep up the great work on the blood sugar, with your lifestyle  Continue medicines, no change today  Too soon for A1c test today, needs to be 3 months  Your provider would like to you have your annual eye exam. Please contact your current eye doctor or here are some good options for you to contact.   [facility] [address], [place], [place] [address] Phone: [phone] [email or web address]  For skin spot, use topical lotion and may try hydrocortisone - if changing notify office  DUE for NON FASTING BLOOD WORK  SCHEDULE "Lab Only" visit in the morning at the clinic for lab draw in 2-4 WEEKS   - Make sure Lab Only appointment is at about 1 week before your next appointment, so that results will be available  For Lab Results, once available within 2-3 days of blood draw, you can can log in to MyChart online to view your results and a brief explanation. Also, we can discuss results at next follow-up visit.   Please schedule a Follow-up Appointment to: Return in about 4 months (around 10/09/2017) for DM A1c.  If you have any other questions or concerns, please feel free to call the office or send a message through MyChart. You may also schedule an earlier appointment if necessary.  Additionally, you may be receiving a survey about your experience at our office within a few days to 1 week by e-mail or mail. We value your feedback.  Saralyn PilarAlexander Karamalegos, DO [name]

## 2017-06-11 NOTE — Progress Notes (Signed)
Subjective:    Patient ID: Cheryl Roberts, female    DOB: 06/05/1971, 47 y.o.   MRN: [id number]  Cheryl Roberts is a 47 y.o. female presenting on 06/11/2017 for Diabetes (highest 142 and lowest 120)   HPI   CHRONIC DM, Type 2: Today patient reports believes CBG improved still. Doing well on higher dose Jardiance 25mg , she admits inc urination but improving water intake. CBGs: Avg 130-150s, low >100, high < 200 - checks x 2 daily Meds: Metformin XR 750mg  daily, Jardiance 25mg  daily (increased last visit 03/2017) Currently on Aspirin, Atorvastatin Lifestyle: - Diet: still improving DM diet, inc vegetables and limiting potatoes, limiting bread - Exercise: Increasing her regular walking few times weekly, still keeping up with regular walking exercise - Weight down 3 lbs since October 2018 - Previously went to [facility] for last DM eye check 04/2016, will schedule Denies hypoglycemia, polyuria, visual changes, numbness or tingling.  Skin Abnormality - Reports new complaint. Patch of darker dry skin area on R flank, initially appeared slightly red, had something similar in past in different area, she put lotion on other spot and it improved eventually. Now she thought maybe a bruise or other problem, but did not have an injury. - Start using [employer] - Denies any numbness, tingling, pain, swelling, trauma  FOLLOW-UP Ear Pain, Right / R- TMJ Arthralgia - Reviewed most recent update from [facility], 04/21/17, seen by Dr Hermelinda Medicus, chart review and patient report that no evidence of AOM or effusion, thought to have some arthralgia TMJ pain on right, she was told may have bruxism and teeth grinding as cause, she was given mouth guard and this has improved No new concerns or ear pain today  Health Maintenance: UTD except DM Eye Exam  Depression screen [facility] 2/9 03/03/2017 05/31/2016 02/09/2016  Decreased Interest 0 0 0  Down, Depressed, Hopeless 1 0 0  PHQ - 2 Score 1 0 0    Difficult doing work/chores Not difficult at all - -    Social History   Tobacco Use  . Smoking status: Never Smoker  . Smokeless tobacco: Never Used  Substance Use Topics  . Alcohol use: Yes    Comment: occas wine or beer  . Drug use: No    Review of Systems Per HPI unless specifically indicated above     Objective:    BP 120/74   Pulse 69   Temp 98.4 F (36.9 C) (Oral)   Resp 16   Ht 4\' 9"  (1.448 m)   Wt 137 lb 8 oz (62.4 kg)   BMI 29.75 kg/m   Wt Readings from Last 3 Encounters:  06/11/17 137 lb 8 oz (62.4 kg)  04/06/17 134 lb (60.8 kg)  03/28/17 140 lb (63.5 kg)    Physical Exam  Constitutional: She is oriented to person, place, and time. She appears well-developed and well-nourished. No distress.  Well-appearing, comfortable, cooperative  HENT:  Head: Normocephalic and atraumatic.  Mouth/Throat: Oropharynx is clear and moist.  Eyes: Conjunctivae are normal. Right eye exhibits no discharge. Left eye exhibits no discharge.  Neck: Normal range of motion. Neck supple.  Cardiovascular: Normal rate, regular rhythm, normal heart sounds and intact distal pulses.  No murmur heard. Pulmonary/Chest: Effort normal and breath sounds normal. No respiratory distress. She has no wheezes. She has no rales.  Musculoskeletal: Normal range of motion. She exhibits no edema.  Lymphadenopathy:    She has no cervical adenopathy.  Neurological: She is alert  and oriented to person, place, and time.  Skin: Skin is warm and dry. No rash noted. She is not diaphoretic. No erythema.  See picture below - approx 1.5 to 2 cm dry patch of brown skin, on R flank, non tender, no raised lesion  Psychiatric: She has a normal mood and affect. Her behavior is normal.  Well groomed, good eye contact, normal speech and thoughts  Nursing note and vitals reviewed.    Right Flank     Recent Labs    08/23/16 1507 12/16/16 1615 03/25/17 0807  HGBA1C 8.3 9.8 8.4*    Results for orders  placed or performed during the hospital encounter of 04/06/17  Glucose, capillary  Result Value Ref Range   Glucose-Capillary 134 (H) 65 - 99 mg/dL      Assessment & Plan:   Problem List Items Addressed This Visit    Arthralgia of right temporomandibular joint    Improved s/p mouth guard Etiology for persistent R ear pain [facility]      Pigmented skin lesion    New lesion present on R flank, uncertain duration Not consistent with nevus, seems more like spot of acanthosis,in setting of DM Asymptomatic Reassurance, monitor for changes follow-up as needed      Uncontrolled diabetes mellitus type 2 without complications (HCC) - Primary    Not due yet for A1c, previously uncontrolled DM but improving from >9 now last 8.4 on jardiance - Reassuring with history CBG seems improved again today No known complications or hypoglycemia.  Plan: 1. Continue Jardiance 25mg  daily (higher dose since last visit), Metformin XR 750mg  daily - future consider double dose metformin if tolerated 3. Encourage improved lifestyle - low carb, low sugar diet, reduce portion size, continue improving regular exercise with increased walking 3. Check CBG still, bring log to next visit for review 4. Continue ASA, Statin 5. Will need DM eye exam [facility] ASAP, due since 04/2017 - patient to call and schedule 6. Future order A1c in 2 weeks since not quite 3 months yet - will determine follow-up based on result, most likely 3-4 months for DM A1c      Relevant Orders   Hemoglobin A1c      No orders of the defined types were placed in this encounter.     Follow up plan: Return in about 4 months (around 10/09/2017) for DM A1c.  Saralyn PilarAlexander Temika Sutphin, DO [facility] 06/11/2017, 11:18 PM

## 2017-06-11 NOTE — Assessment & Plan Note (Signed)
New lesion present on R flank, uncertain duration Not consistent with nevus, seems more like spot of acanthosis,in setting of DM Asymptomatic Reassurance, monitor for changes follow-up as needed

## 2017-06-11 NOTE — Assessment & Plan Note (Signed)
Improved s/p mouth guard Etiology for persistent R ear pain [facility]

## 2017-06-11 NOTE — Assessment & Plan Note (Signed)
Not due yet for A1c, previously uncontrolled DM but improving from >9 now last 8.4 on jardiance - Reassuring with history CBG seems improved again today No known complications or hypoglycemia.  Plan: 1. Continue Jardiance 25mg  daily (higher dose since last visit), Metformin XR 750mg  daily - future consider double dose metformin if tolerated 3. Encourage improved lifestyle - low carb, low sugar diet, reduce portion size, continue improving regular exercise with increased walking 3. Check CBG still, bring log to next visit for review 4. Continue ASA, Statin 5. Will need DM eye exam [facility] ASAP, due since 04/2017 - patient to call and schedule 6. Future order A1c in 2 weeks since not quite 3 months yet - will determine follow-up based on result, most likely 3-4 months for DM A1c

## 2017-06-25 ENCOUNTER — Other Ambulatory Visit: Payer: Commercial Managed Care - PPO

## 2017-06-25 DIAGNOSIS — E1165 Type 2 diabetes mellitus with hyperglycemia: Principal | ICD-10-CM

## 2017-06-25 DIAGNOSIS — IMO0001 Reserved for inherently not codable concepts without codable children: Secondary | ICD-10-CM

## 2017-06-26 LAB — HEMOGLOBIN A1C
EAG (MMOL/L): 9.3 (calc)
HEMOGLOBIN A1C: 7.5 %{Hb} — AB (ref <5.7)
MEAN PLASMA GLUCOSE: 169 (calc)

## 2017-07-10 IMAGING — CR DG LUMBAR SPINE COMPLETE 4+V
5 series · 5 of 5 positions shown · non-contrast
Comparison: None.

CLINICAL DATA: Low back pain exam left-sided buttock pain.

EXAM:
LUMBAR SPINE - COMPLETE 4+ VIEW

[l-spine ap]
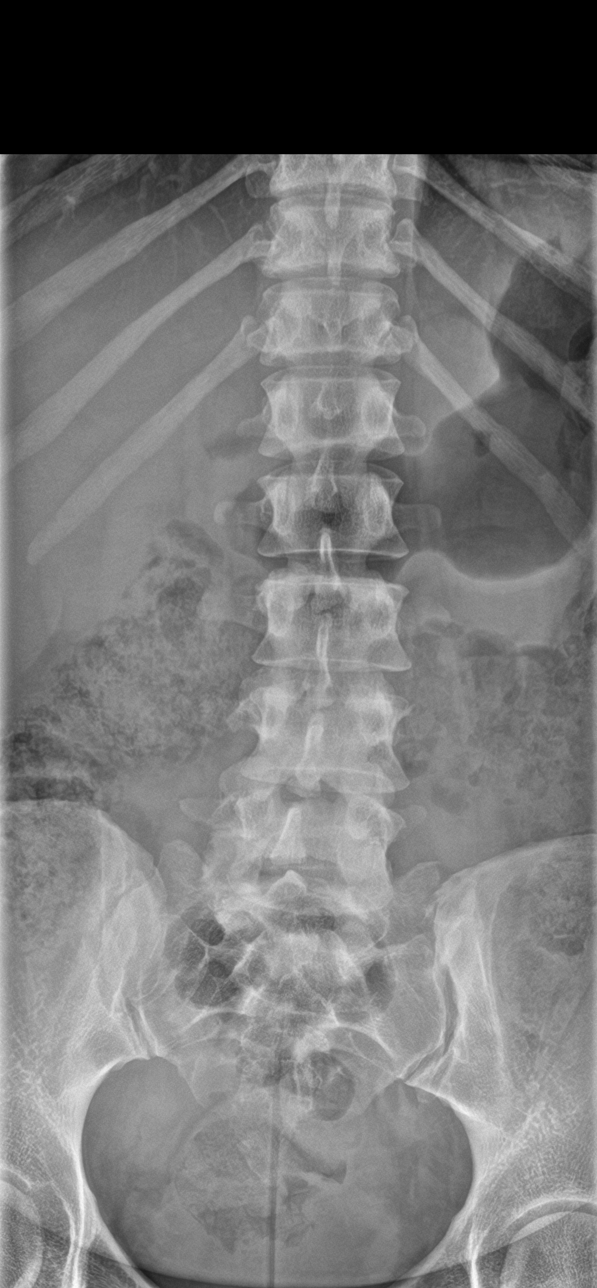

[l-spine obl (1 of 2)]
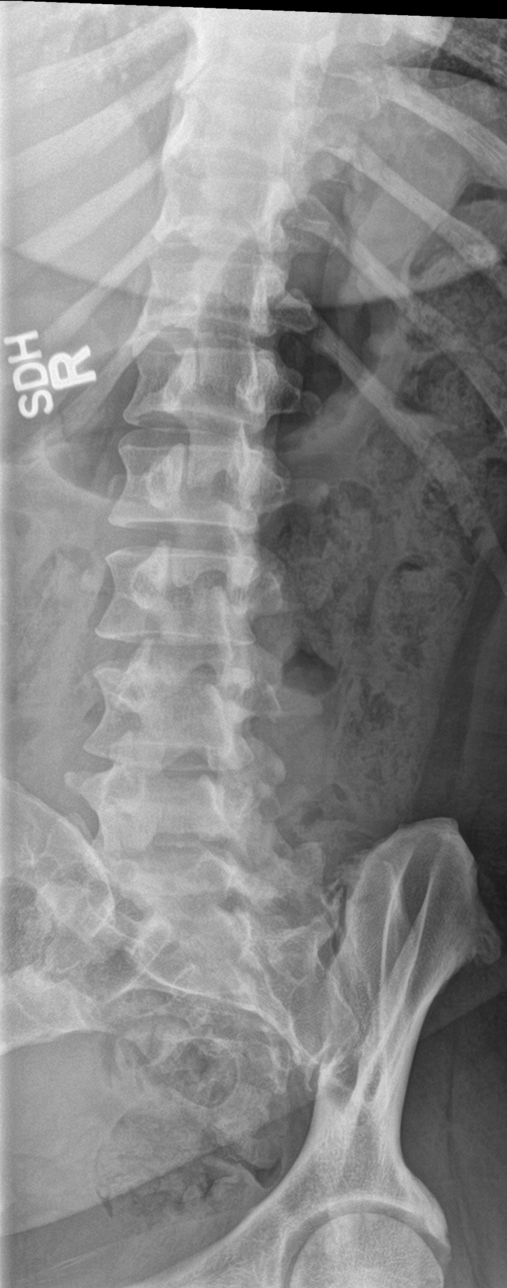

[l-spine obl (2 of 2)]
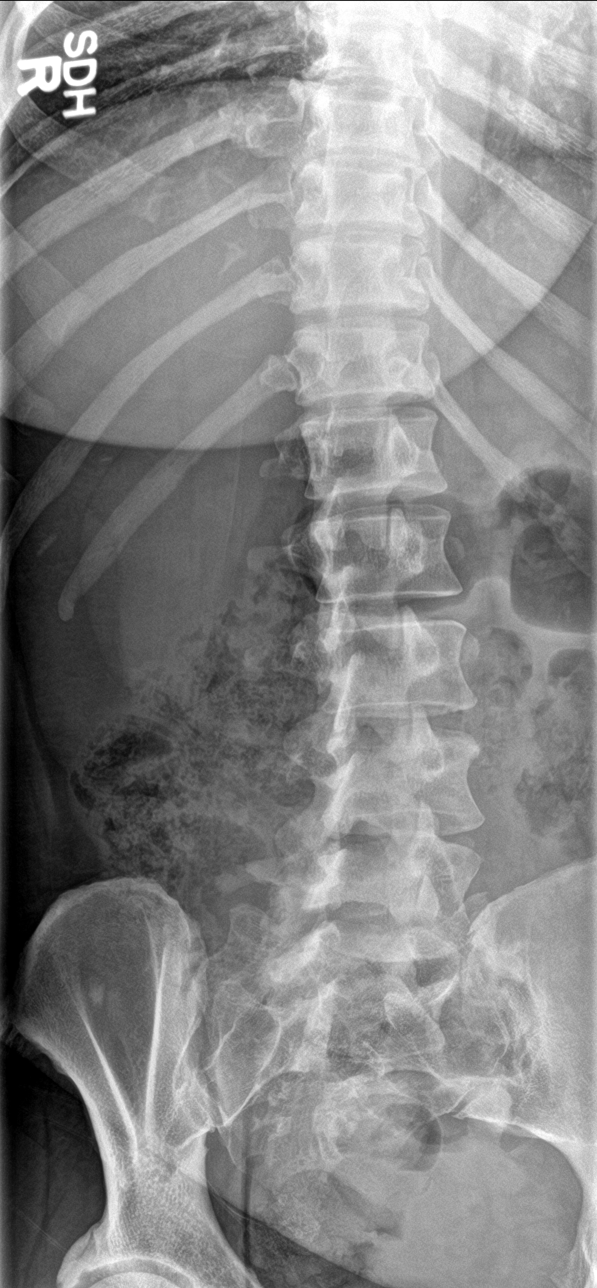

[l-spine lat]
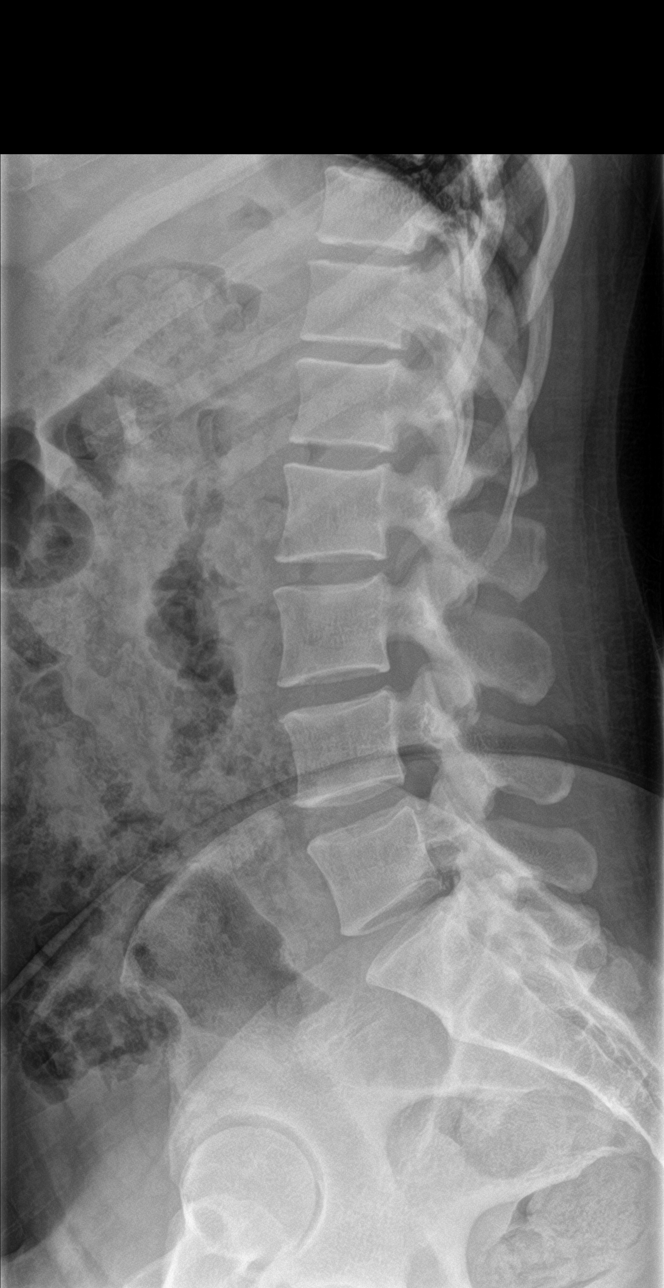

[l-spine spot]
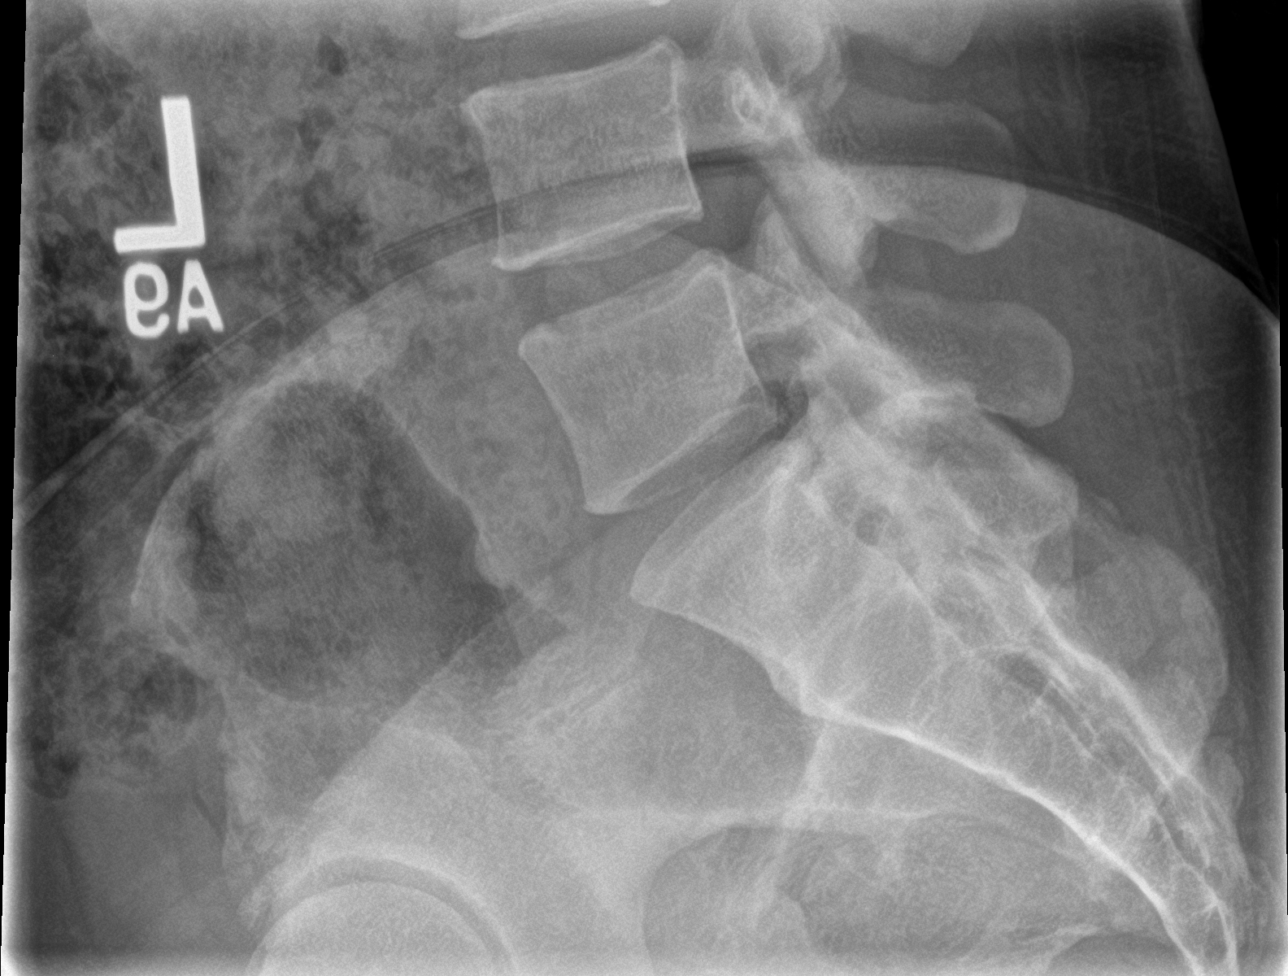

[5 of 5 positions shown; findings below may reference images not displayed]

FINDINGS: Normal alignment of the lumbar vertebral bodies. Disc spaces and
vertebral bodies are maintained. The facets are normally aligned. No
pars defects. The visualized bony pelvis is intact.
IMPRESSION: Normal alignment and no acute bony findings or degenerative changes.

## 2017-08-08 ENCOUNTER — Encounter: Payer: Self-pay | Admitting: Nurse Practitioner

## 2017-08-08 ENCOUNTER — Ambulatory Visit (INDEPENDENT_AMBULATORY_CARE_PROVIDER_SITE_OTHER): Payer: Commercial Managed Care - PPO | Admitting: Nurse Practitioner

## 2017-08-08 VITALS — BP 135/69 | HR 94 | Temp 100.2°F | Resp 18 | Ht <= 58 in | Wt 141.0 lb

## 2017-08-08 DIAGNOSIS — J00 Acute nasopharyngitis [common cold]: Secondary | ICD-10-CM | POA: Diagnosis not present

## 2017-08-08 DIAGNOSIS — R509 Fever, unspecified: Secondary | ICD-10-CM | POA: Diagnosis not present

## 2017-08-08 LAB — POCT INFLUENZA A/B
Influenza A, POC: NEGATIVE
Influenza B, POC: NEGATIVE

## 2017-08-08 MED ORDER — IPRATROPIUM BROMIDE 0.06 % NA SOLN: 2.0000 | Freq: Four times a day (QID) | NASAL | 0 refills | Status: DC

## 2017-08-08 NOTE — Patient Instructions (Addendum)
Judyth, Thank you for coming in to clinic today.  1. It sounds like you have a Upper Respiratory Virus - this will most likely run it's course in 7 to 10 days. Recommend good hand washing. - Start Atrovent nasal spray decongestant 2 sprays each nostril up to 4 times daily for 5-7 days - Start anti-histamine loratadine or cetirizine 10mg  daily,  - If congestion is worse, start OTC Mucinex (or may try Mucinex-DM for cough) up to 7-10 days then stop - Drink plenty of fluids to improve congestion - You may try over the counter Nasal Saline spray (Simply Saline, Ocean Spray) as needed to reduce congestion. - Drink warm herbal tea with honey for sore throat. - Start taking Tylenol extra strength 1 to 2 tablets every 6-8 hours for aches or fever/chills for next few days as needed.  Do not take more than 3,000 mg in 24 hours from all medicines.  May take Ibuprofen as well if tolerated 200-400mg  every 8 hours as needed.  If symptoms significantly worsening with persistent fevers/chills despite tylenol/ibpurofen, nausea, vomiting unable to tolerate food/fluids or medicine, body aches, or shortness of breath, sinus pain pressure or worsening productive cough, then follow-up for re-evaluation, may seek more immediate care at Urgent Care or ED if more concerned for emergency.  Call on Tuesday or Wednesday if you are still having symptoms that do not improve.  We can consider antibiotics.  Please schedule a follow-up appointment with Wilhelmina McardleLauren Emitt Maglione, AGNP. Return 5-7 days if symptoms worsen or fail to improve.  If you have any other questions or concerns, please feel free to call the clinic or send a message through MyChart. You may also schedule an earlier appointment if necessary.  You will receive a survey after today's visit either digitally by e-mail or paper by [employer] mail. Your experiences and feedback matter to us.  Please respond so we know how we are doing as we provide care for you.   Wilhelmina McardleLauren  Amrit Erck, DNP, AGNP-BC Adult Gerontology Nurse Practitioner [facility], [facility]

## 2017-08-08 NOTE — Progress Notes (Signed)
Subjective:    Patient ID: Cheryl Roberts, female    DOB: March 29, 1971, 47 y.o.   MRN: [id number]  Cheryl Roberts is a 47 y.o. female presenting on 08/08/2017 for Sinus Problem (head congestion, nasal drainage, nauseated, and eye pressure x 3 days )   HPI URI symptoms URI symptoms started Monday (5 days ago) with nausea and earache.   Tuesday felt well.  Wed had more sinus and nasal congestion with eye pressure. Today, eyes have been watering and are hurting (aching, pressure). - Some chills and sweats today, fever with temp 100.6 measured by RN at work, but no more nausea.   - Pt denies sore throat, coughing, tooth or jaw pain, nausea, diarrhea, or constipation.  - Monday, took 2 tylenol PM for pain and had relief. Took theraflu last night with minimal relief.  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  Social History   Tobacco Use  . Smoking status: Never Smoker  . Smokeless tobacco: Never Used  Substance Use Topics  . Alcohol use: Yes    Comment: occas wine or beer  . Drug use: No    Review of Systems Per HPI unless specifically indicated above     Objective:    BP 135/69 (BP Location: Right Arm, Patient Position: Sitting, Cuff Size: Normal)   Pulse 94   Temp 100.2 F (37.9 C) (Oral)   Resp 18   Ht 4\' 9"  (1.448 m)   Wt 141 lb (64 kg)   SpO2 98%   BMI 30.51 kg/m   Wt Readings from Last 3 Encounters:  08/08/17 141 lb (64 kg)  06/11/17 137 lb 8 oz (62.4 kg)  04/06/17 134 lb (60.8 kg)    Physical Exam  Constitutional: She is oriented to person, place, and time. She appears well-developed and well-nourished. She appears distressed (mildly).  HENT:  Head: Normocephalic and atraumatic.  Right Ear: Hearing, tympanic membrane, external ear and ear canal normal.  Left Ear: Hearing, tympanic membrane, external ear and ear canal normal.  Nose: Mucosal edema and rhinorrhea present. Right sinus exhibits maxillary sinus tenderness and frontal  sinus tenderness. Left sinus exhibits maxillary sinus tenderness and frontal sinus tenderness.  Mouth/Throat: Uvula is midline and mucous membranes are normal. No posterior oropharyngeal edema or posterior oropharyngeal erythema. Oropharyngeal exudate: clear secretions.  Neck: Normal range of motion. Neck supple.  Cardiovascular: Normal rate, regular rhythm, S1 normal, S2 normal, normal heart sounds and intact distal pulses.  Pulmonary/Chest: Effort normal and breath sounds normal. No respiratory distress.  Musculoskeletal: She exhibits no edema (pedal).  Lymphadenopathy:    She has cervical adenopathy (mild deep cervical LAD).  Neurological: She is alert and oriented to person, place, and time.  Skin: Skin is warm and dry.  Psychiatric: She has a normal mood and affect. Her behavior is normal.  Vitals reviewed.    Results for orders placed or performed in visit on 08/08/17  POCT Influenza A/B  Result Value Ref Range   Influenza A, POC Negative Negative   Influenza B, POC Negative Negative      Assessment & Plan:   Problem List Items Addressed This Visit    None    Visit Diagnoses    Fever and chills    -  Primary   Relevant Orders   POCT Influenza A/B (Completed)   Acute nasopharyngitis       Relevant Medications   ipratropium (ATROVENT) 0.06 % nasal spray  Acute illness. Fever responsive to NSAIDs and tylenol.  Symptoms not worsening. Consistent with viral illness x 5 days with multiple known sick contacts and no identifiable focal infections of ears, nose, throat.  Plan: 1. Reassurance, likely self-limited with cough lasting up to few weeks - Start Atrovent nasal spray decongestant 2 sprays each nostril up to 4 times daily for 5-7 days - Start anti-histamine Loratadine or Cetirizine 10mg  daily,  - Start Mucinex/ Mucinex-DM OTC up to 7-10 days then stop - OTC acetaminophen/iburpofen for fever and aches 2. Supportive care with nasal saline, warm herbal tea with honey, 3.  Improve hydration 4. Tylenol / Motrin PRN fevers 5. Return criteria given.  Will consider antibiotics if no improvement by 3/5-3/6.   Meds ordered this encounter  Medications  . ipratropium (ATROVENT) 0.06 % nasal spray    Sig: Place 2 sprays into both nostrils 4 (four) times daily. For up to 5-7 days then stop.    Dispense:  15 mL    Refill:  0    Order Specific Question:   Supervising Provider    Answer:   Smitty CordsKARAMALEGOS, ALEXANDER J [[id number]]    Follow up plan: Return 5-7 days if symptoms worsen or fail to improve.  Wilhelmina McardleLauren Laquisha Northcraft, DNP, AGPCNP-BC Adult Gerontology Primary Care Nurse Practitioner [facility] 08/12/2017, 8:05 AM

## 2017-08-12 ENCOUNTER — Encounter: Payer: Self-pay | Admitting: Nurse Practitioner

## 2017-09-07 ENCOUNTER — Other Ambulatory Visit: Payer: Self-pay

## 2017-09-07 ENCOUNTER — Encounter: Payer: Self-pay | Admitting: *Deleted

## 2017-09-07 ENCOUNTER — Emergency Department
Admission: EM | Admit: 2017-09-07 | Discharge: 2017-09-07 | Disposition: A | Discharge status: Home / Self Care | Payer: Commercial Managed Care - PPO | Attending: Emergency Medicine | Admitting: Emergency Medicine

## 2017-09-07 DIAGNOSIS — Z7984 Long term (current) use of oral hypoglycemic drugs: Secondary | ICD-10-CM | POA: Diagnosis not present

## 2017-09-07 DIAGNOSIS — M542 Cervicalgia: Secondary | ICD-10-CM | POA: Insufficient documentation

## 2017-09-07 DIAGNOSIS — E119 Type 2 diabetes mellitus without complications: Secondary | ICD-10-CM | POA: Insufficient documentation

## 2017-09-07 DIAGNOSIS — M5412 Radiculopathy, cervical region: Secondary | ICD-10-CM | POA: Diagnosis not present

## 2017-09-07 DIAGNOSIS — I10 Essential (primary) hypertension: Secondary | ICD-10-CM | POA: Insufficient documentation

## 2017-09-07 DIAGNOSIS — Z79899 Other long term (current) drug therapy: Secondary | ICD-10-CM | POA: Insufficient documentation

## 2017-09-07 DIAGNOSIS — Z7982 Long term (current) use of aspirin: Secondary | ICD-10-CM | POA: Diagnosis not present

## 2017-09-07 DIAGNOSIS — J4520 Mild intermittent asthma, uncomplicated: Secondary | ICD-10-CM | POA: Insufficient documentation

## 2017-09-07 MED ORDER — GABAPENTIN 300 MG PO CAPS: 300.0000 mg | ORAL_CAPSULE | Freq: Every day | ORAL | 0 refills | Status: DC

## 2017-09-07 NOTE — ED Provider Notes (Signed)
[facility] EMERGENCY DEPARTMENT Provider Note   CSN: [id number] Arrival date & time: 09/07/17  1259     History   Chief Complaint Chief Complaint  Patient presents with  . Arm Injury    HPI Cheryl Roberts is a 47 y.o. female presents to the emergency department for evaluation of left arm pain.  Left arm pain is been present for 1 year.  Patient states she had a fall one year ago at work.  She is been seen several times and diagnosed with cervical radiculopathy and has been scheduled for MRI of her cervical spine but due to cost has not been able to have this performed.  She has set up charity care at [facility] but missed the deadline in is still unable to get this covered.  She is in the process of resubmitting paperwork for charity care at [facility].  She complains of constant pain in her left arm, radiating from her neck all the way down the arm into the hands and fingers.  She denies any numbness tingling or burning.  No nighttime awakening.  She describes the pain as sharp.  She denies the sensation of numbness with use of hands.  Patient is taking Tylenol and ibuprofen with no improvement.  New trauma or injury.  No weakness. HPI  Past Medical History:  Diagnosis Date  . Asthma   . Cervical radiculopathy   . Chronic sinusitis   . Diabetes mellitus without complication ([place])   . Hyperlipidemia   . Hypertension     Patient Active Problem List   Diagnosis Date Noted  . Arthralgia of right temporomandibular joint 06/11/2017  . Pigmented skin lesion 06/11/2017  . 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 Cervical radiculopathy 01/17/2017  . Chronic low back pain without sciatica 06/22/2016  . Strain of left trapezius muscle 06/22/2016  . Allergic rhinitis due to allergen 03/26/2016  . Hyperlipidemia associated with type 2 diabetes mellitus ([place]) 03/15/2016  . Hypertension 02/09/2016  . Uncontrolled diabetes mellitus type 2 without complications  ([place]) 52/84/1324  . DJD (degenerative joint disease) of cervical spine 02/09/2016  . Obesity (BMI 30.0-34.9) 02/09/2016  . Asthma, mild intermittent 02/09/2016  . Sialoadenitis 02/28/2003    History reviewed. No pertinent surgical history.   OB History   None      Home Medications    Prior to Admission medications   Medication Sig Start Date End Date Taking? Authorizing Provider  albuterol (PROVENTIL HFA;VENTOLIN HFA) 108 (90 Base) MCG/ACT inhaler Inhale 2 puffs into the lungs every 2 (two) hours as needed for wheezing or shortness of breath (cough). 02/09/16   Karamalegos, Devonne Doughty, DO  aspirin 81 MG tablet Take 1 tablet (81 mg total) by mouth daily. 03/15/16   Karamalegos, Devonne Doughty, DO  atorvastatin (LIPITOR) 40 MG tablet Take 1 tablet (40 mg total) by mouth daily. 03/17/17   Karamalegos, Devonne Doughty, DO  baclofen (LIORESAL) 10 MG tablet Take 0.5-1 tablets (5-10 mg total) by mouth 3 (three) times daily as needed for muscle spasms. 01/17/17   Karamalegos, Devonne Doughty, DO  Blood Glucose Monitoring Suppl (TRUE METRIX METER) w/Device KIT 1 each by Does not apply route as directed. 05/29/16   Karamalegos, Devonne Doughty, DO  cyclobenzaprine (FLEXERIL) 10 MG tablet Take by mouth. 08/23/16   [provider]  empagliflozin (JARDIANCE) 25 MG TABS tablet Take 25 mg by mouth daily. 03/31/17   Karamalegos, Devonne Doughty, DO  fluticasone (FLONASE) 50 MCG/ACT nasal spray  Place 1 spray into both nostrils daily. For 2 to 4 weeks then as needed. 03/26/16   Karamalegos, Devonne Doughty, DO  gabapentin (NEURONTIN) 300 MG capsule Take 1 capsule (300 mg total) by mouth at bedtime. 1 capsule by mouth at bedtime X 4 days then progress to 1 capsule in the morning and 1 capsule at night 09/07/17 09/07/18  Duanne Guess, PA-C  glucose blood (TRUE METRIX BLOOD GLUCOSE TEST) test strip Check blood sugar twice daily. Dx E11.9 05/29/16   Olin Hauser, DO  hydrochlorothiazide (HYDRODIURIL) 25 MG tablet  TAKE ONE TABLET BY MOUTH EVERY DAY 01/31/17   Olin Hauser, DO  HYDROcodone-acetaminophen (NORCO/VICODIN) 5-325 MG tablet Take 1 tablet by mouth every 6 (six) hours as needed for moderate pain. 04/09/17   Karamalegos, Devonne Doughty, DO  ipratropium (ATROVENT) 0.06 % nasal spray Place 2 sprays into both nostrils 4 (four) times daily. For up to 5-7 days then stop. 08/08/17   Mikey College, NP  isoniazid (NYDRAZID) 300 MG tablet Take 300 mg by mouth.    [provider]  LANCETS ULTRA FINE MISC 1 each by Does not apply route 2 (two) times daily. Check blood sugar twice daily. Dx: E11.9 05/29/16   Olin Hauser, DO  lisinopril (PRINIVIL,ZESTRIL) 10 MG tablet TAKE ONE TABLET BY MOUTH EVERY DAY 01/31/17   Parks Ranger, Devonne Doughty, DO  loratadine (CLARITIN) 10 MG tablet Take 1 tablet (10 mg total) by mouth daily. 03/26/16   Karamalegos, Devonne Doughty, DO  meloxicam (MOBIC) 15 MG tablet Take 1 tablet (15 mg total) by mouth daily. 04/08/17   Parks Ranger, Devonne Doughty, DO  metFORMIN (GLUCOPHAGE-XR) 750 MG 24 hr tablet Take 1 tablet (750 mg total) by mouth daily with breakfast. 08/23/16   Olin Hauser, DO  pyridOXINE (B-6) 100 MG/ML injection Take by mouth.    [provider]    Family History Family History  Problem Relation Age of Onset  . Hypertension Mother   . Hyperthyroidism Mother   . Gout Mother   . Diabetes Mother   . Arthritis Mother   . Cancer Father     Social History Social History   Tobacco Use  . Smoking status: Never Smoker  . Smokeless tobacco: Never Used  Substance Use Topics  . Alcohol use: Yes    Comment: occas wine or beer  . Drug use: No     Allergies   Amoxicillin   Review of Systems Review of Systems  Constitutional: Negative for fever.  Respiratory: Negative for shortness of breath.   Cardiovascular: Negative for chest pain.  Gastrointestinal: Negative for abdominal pain.  Genitourinary: Negative for  difficulty urinating, dysuria and urgency.  Musculoskeletal: Positive for myalgias and neck pain. Negative for back pain.  Skin: Negative for rash.  Neurological: Negative for dizziness and headaches.     Physical Exam Updated Vital Signs BP 137/87 (BP Location: Left Arm)   Pulse 76   Temp 98.4 F (36.9 C) (Oral)   Resp 16   Ht 4' 9" (1.448 m)   Wt 63 kg (139 lb)   LMP 09/03/2017   SpO2 99%   BMI 30.08 kg/m   Physical Exam  Constitutional: She is oriented to person, place, and time. She appears well-developed and well-nourished.  HENT:  Head: Normocephalic and atraumatic.  Eyes: Conjunctivae are normal.  Neck: Normal range of motion.  Cardiovascular: Normal rate.  Pulmonary/Chest: Effort normal. No respiratory distress.  Musculoskeletal:  Cervical Spine: Examination of the cervical  spine reveals no bony abnormality, no edema, and no ecchymosis.  There is no step-off.  The patient has full active and passive range of motion of the cervical spine with flexion, extension, and right and left bend with rotation.  There is no crepitus with range of motion exercises.  The patient is non-tender along the spinous process to palpation.  The patient has no paravertebral muscle spasm.  There is no parascapular discomfort.  The patient has a negative axial compression test.  The patient has a positive painful Spurling test.     Left upper Extremity: Examination of the left shoulder and arm showed no bony abnormality or edema.  The patient has normal active and passive motion with abduction, flexion, internal rotation, and external rotation.  The patient has no tenderness with motion.  The patient has a negative Hawkins test and a negative impingement test.  The patient has a negative drop arm test.  The patient is non-tender along the deltoid muscle.  There is no subacromial space tenderness with no AC joint tenderness.  The patient has no instability of the shoulder with anterior-posterior  motion.  There is a negative sulcus sign.  The rotator cuff muscle strength is 5/5 with supraspinatus, 5/5 with internal rotation, and 5/5 with external rotation.  There is no crepitus with range of motion activities.      Neurological: She is alert and oriented to person, place, and time.  Skin: Skin is warm. No rash noted.  Psychiatric: She has a normal mood and affect. Her behavior is normal. Thought content normal.     ED Treatments / Results  Labs (all labs ordered are listed, but only abnormal results are displayed) Labs Reviewed - No data to display  EKG None  Radiology No results found.  Procedures Procedures (including critical care time)  Medications Ordered in ED Medications - No data to display   Initial Impression / Assessment and Plan / ED Course  I have reviewed the triage vital signs and the nursing notes.  Pertinent labs & imaging results that were available during my care of the patient were reviewed by me and considered in my medical decision making (see chart for details).     46-year-old female with cervicalgia and left-sided cervical radiculopathy.  No weakness or neurological deficits noted on exam.  She is been seen several times and has been set up for MRIs but due to cost has not been able to undergo the imaging.  She is in the process of filling out paperwork for charity care at [facility].  Patient is currently taking Tylenol and ibuprofen for pain.  She will start gabapentin today.  She is educated on signs and symptoms to return to the ED for.  Final Clinical Impressions(s) / ED Diagnoses   Final diagnoses:  Cervicalgia  Left cervical radiculopathy    ED Discharge Orders        Ordered    gabapentin (NEURONTIN) 300 MG capsule  Daily at bedtime     09/07/17 1405       Gaines, Thomas C, PA-C 09/07/17 1414    Forbach, Cory, MD 09/07/17 1422  

## 2017-09-07 NOTE — Discharge Instructions (Addendum)
Please continue with ibuprofen and Tylenol as needed for left arm pain.  He may start gabapentin 1 tab at night for 4 nights and after 4 nights she can progress to 1 tab in the morning and 1 tablet at night.  Follow-up with primary care provider in 1 week for recheck.  Return to the ER for any worsening symptoms or urgent changes in health.

## 2017-09-07 NOTE — ED Notes (Signed)
FIRST NURSE NOTE:  Pt c/o left arm pain, states she fell on her left shoulder last year.  States continues to have pain.

## 2017-09-07 NOTE — ED Notes (Addendum)
See triage note   Presents with left arm pain  States she fell 1 year ago  Landing on that same arm  Has had cont'd pain to same arm   No deformity note  But does have some decrease movement to arm

## 2017-09-07 NOTE — ED Triage Notes (Addendum)
Pt to Ed reporting a left arm injury a year ago that has caused intermittent pain in left shoulder and arm. Pt reports PCP had told her that she had a pinched nerve and wanted pt to have a MRI done but pt denies being able to have the test due to cost. Pt reports pain is worse today and worse with movement and wanted to make sure she has not worsened the injury. Decreased range of motion noted. No decreased sensation. Pulse intact.

## 2017-09-10 ENCOUNTER — Other Ambulatory Visit: Payer: Self-pay | Admitting: Family Medicine

## 2017-09-10 DIAGNOSIS — E119 Type 2 diabetes mellitus without complications: Secondary | ICD-10-CM

## 2017-09-15 ENCOUNTER — Encounter: Payer: Self-pay | Admitting: Family Medicine

## 2017-09-15 ENCOUNTER — Ambulatory Visit (INDEPENDENT_AMBULATORY_CARE_PROVIDER_SITE_OTHER): Payer: Commercial Managed Care - PPO | Admitting: Family Medicine

## 2017-09-15 VITALS — BP 120/68 | HR 86 | Temp 98.9°F | Resp 16 | Ht <= 58 in | Wt 141.0 lb

## 2017-09-15 DIAGNOSIS — M4722 Other spondylosis with radiculopathy, cervical region: Secondary | ICD-10-CM

## 2017-09-15 DIAGNOSIS — M79602 Pain in left arm: Secondary | ICD-10-CM | POA: Diagnosis not present

## 2017-09-15 DIAGNOSIS — G8929 Other chronic pain: Secondary | ICD-10-CM | POA: Diagnosis not present

## 2017-09-15 DIAGNOSIS — M5412 Radiculopathy, cervical region: Secondary | ICD-10-CM | POA: Diagnosis not present

## 2017-09-15 NOTE — Patient Instructions (Addendum)
Thank you for coming to the office today.  Call [facility] to try to re-establish with them for this issue.  [facility] [place]  [address]  [place], [place] [address]  [phone]  Einar CrowSanderson, Katelyn [place], [place]  [address]  [facility]pt [facility]  [place], [place] [address]  [phone]    See if they can work on MRI application and for you to see them.  I will likely put them down as the specialist  Will complete form, to be out of work on short term leave - June 3rd - expected 90 days or 3 months, and [facility]  Please schedule a Follow-up Appointment to: Return in about 1 month (around 10/13/2017), or if symptoms worsen or fail to improve.  If you have any other questions or concerns, please feel free to call the office or send a message through MyChart. You may also schedule an earlier appointment if necessary.  Additionally, you may be receiving a survey about your experience at our office within a few days to 1 week by e-mail or mail. We value your feedback.  Saralyn PilarAlexander Karamalegos, DO [name]

## 2017-09-16 ENCOUNTER — Encounter: Payer: Self-pay | Admitting: Family Medicine

## 2017-09-16 NOTE — Progress Notes (Signed)
Subjective:    Patient ID: Cheryl Roberts, female    DOB: 1971-03-16, 47 y.o.   MRN: [id number]  Cheryl Roberts is a 47 y.o. female presenting on 09/15/2017 for Arm Pain (Left side)   HPI   Left Upper Extremity Pain (Chronic) / Degenerative Disc/Joint of C-Spine w/ Radiculopathy - Background history on this problem, previously had pain in this area L neck and L upper extremity with onset back in early February 2017. She was seen by [facility] by [facility], prior C-spine x-rays reviewed, determined DDD and nerve root issue C5-c6, referred to PT and considered MRI, given meds flexeril etc. Ultimately she was lost to follow-up, unable to get MRI C-spine due to cost. - Next update, she was [facility]) for worker's comp case for 3 months (since 10/2016) with L neck pain and cervical radiculopathy, but then it was denied due to having prior chronic problem on Left neck pain and muscle pain about 1 year ago, also she had PT at that time, therefore was determined not caused by her work more recently. Recently per Dr Chalmers Guest she was on Light Duty for several weeks with still some difficulty at work and not improving, she was limited on time she could stay active on feet, had more breaks, and limited lifting avoid heavy >10 lbs, she is unsure how long she can continue these light duty recs  She re-established with me for same problem in August 2018. Her medicines were adjusted, given burst of Prednisone taper, and then Baclofen instead of flexeril, and NSAID, meloxicam. She was already set up to see [employer] next not as worker's comp. She saw Dr Van Clines at [facility] in Sept / Oct 2018, was treated with formal physical therapy, ultimately she again was requested to have C-spine MRI but this was not able to be completed due to financial limitation. She was lost to follow-up from them.  She was then seen by [facility] 05/06/17 by Myles Gip MD,  they were again concerned of some issues cervical radiculopathy, requested MRI and if positive then would refer to [facility]. Patient is currently working on application for [facility] to get MRI. She did not establish with [facility] as PCP.  Today she reports that she will need Short Term Disability paperwork completed as she plans to take absence from work due to various symptoms caused by her Left neck/upper extremity pain limiting her job function. She is not sure duration of absence, pending treatment, however she plans to begin absence in June 2019.  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 She is limited with Left upper extremity range of motion, grip, repetitive gross motor actions with L hand. - Admits weakness intermittently associated with pain in left arm - Denies significant persistent issues of numbness, tingling, other joint pain, trauma injury fall   Depression screen [facility] 2/9 09/15/2017 03/03/2017 05/31/2016  Decreased Interest 0 0 0  Down, Depressed, Hopeless 0 1 0  PHQ - 2 Score 0 1 0  Difficult doing work/chores - Not difficult at all -    Social History   Tobacco Use  . Smoking status: Never Smoker  . Smokeless tobacco: Never Used  Substance Use Topics  . Alcohol use: Yes    Comment:  occas wine or beer  . Drug use: No    Review of Systems Per HPI unless specifically indicated above     Objective:    BP 120/68   Pulse 86   Temp 98.9 F (37.2 C) (Oral)   Resp 16   Ht 4\' 9"  (1.448 m)   Wt 141 lb (64 kg)   LMP 09/03/2017   BMI 30.51 kg/m   Wt Readings from Last 3 Encounters:  09/15/17 141 lb (64 kg)  09/07/17 139 lb (63 kg)  08/08/17 141 lb (64 kg)    Physical Exam  Constitutional: She is oriented to person, place, and time. She appears well-developed and  well-nourished. No distress.  Well-appearing, comfortable, cooperative, overweight  HENT:  Head: Normocephalic and atraumatic.  Mouth/Throat: Oropharynx is clear and moist.  Eyes: Conjunctivae are normal. Right eye exhibits no discharge. Left eye exhibits no discharge.  Neck: Normal range of motion. Neck supple. No thyromegaly present.  Neck / Upper Back Inspection: normal appearance Palpation: mild hypertonicity of L sided paraspinal Cervical muscles and trapezius, no longer tender to touch ROM: mostly intact range of motion, some limited rotation Special Testing: Spurling's Maneuver positive LEFT with some reproduced radicular symptoms into Left arm Strength: intact distal 5/5 Neurovascular: distal intact currently  Cardiovascular: Normal rate and intact distal pulses.  Pulmonary/Chest: Effort normal.  Musculoskeletal: She exhibits no edema.  Left Shoulder Inspection: Normal appearance bilateral symmetrical Palpation: Non-tender to palpation over anterior, lateral, or posterior shoulder  ROM: Significantly reduced active ROM forward flexion, only to shoulder level, and limited internal rotation. Special Testing: Rotator cuff testing negative for weakness but with some mild discomfort in L shoulder Strength: Normal strength 5/5 flex/ext, ext rot / int rot, grip, rotator cuff str testing. Neurovascular: Distally intact pulses, sensation to light touch  Lymphadenopathy:    She has no cervical adenopathy.  Neurological: She is alert and oriented to person, place, and time.  Skin: Skin is warm and dry. No rash noted. She is not diaphoretic. No erythema.  Psychiatric: She has a normal mood and affect. Her behavior is normal.  Well groomed, good eye contact, normal speech and thoughts  Nursing note and vitals reviewed.      Assessment & Plan:   Problem List Items Addressed This Visit    Cervical radiculopathy - Primary   DJD (degenerative joint disease) of cervical spine      Persistent to now gradual worsening L sided cervical DDD radiculopathy left upper ext pain Onset 07/2015, has seen variety of providers including various Orthopedics with limited results on conservative care, NSAIDs, Tylenol, OTC, topicals, muscle relaxant, PT, Prednisone bursts - Known OA/DJD C-spine on prior x-rays, last C-spine X-ray 10/18/2016 showed degenerative changes, do not have full report available. Left shoulder x-ray 10/14/16 negative for degenerative arthritis. - She has not had MRI C-spine completed, despite multiple attempted orders from specialists - Some worsening weakness intermittent, reduced ROM  Plan: 1. Discussion on diagnosis and etiology of her symptoms, ultimately she has failed most conservative therapy options, she is now limited to needing advanced imaging, such as MRI C-spine and further Orthopedic / Spine surgery evaluation, pending MRI result. - Agree with plan to take Short Term Disability to have this ongoing chronic injury complaint addressed, as working her job seems to flare up and worsen her symptoms - Will complete paperwork as requested for short term leave, anticipated start date 11/10/17, expected duration at least 12 weeks or 3 months - [facility]ceed with [facility] - Charity care application for MRI and evaluation, gave her contact of [facility] office # from before, to reach out to them, our office can try calling to make arrangements as well   No orders of the defined types were placed in this encounter.   Follow up plan: Return in about 1 month (around 10/13/2017), or if symptoms worsen or fail to improve.  [name], DO [facility] 09/15/17 4:45PM

## 2017-09-18 ENCOUNTER — Encounter: Payer: Self-pay | Admitting: Family Medicine

## 2017-09-18 ENCOUNTER — Telehealth: Payer: Self-pay

## 2017-09-18 NOTE — Telephone Encounter (Signed)
Written note, as requested she was anticipating to stay out for 3 months, from 11/10/17 to September 2019.  Letter written, I will work on completing her Short Term Disability paper work next, and if we need to change we can. We will also work on arranging her follow-[facility] and MRI, she was utilizing charity care but was unable to complete the application  Saralyn PilarAlexander Czarina Gingras, DO [facility] 09/18/2017, 12:22 PM

## 2017-09-18 NOTE — Telephone Encounter (Signed)
Called [facility] to confirm plans, and they said she is already scheduled with Cheryl CrowKatelyn Sanderson PA on 09/26/17. Her charity care application has expired, she will be responsible for $80 cost for visit, if not renewed.  Called patient as well, spoke with Cheryl Roberts today, she has finished the [facility] charity care application but has not submitted it via fax yet, she will work on this now. She will go to apt on 4/16 and follow-up as planned, hopeful for advanced imaging with MRI in future.  She will notify us after she submits letter to HR and may require new Short Term Disability paperwork, therefore I will hold current one for now and stay tuned.  Cheryl PilarAlexander Dahlton Hinde, DO [facility] 09/18/2017, 2:06 PM

## 2017-09-18 NOTE — Telephone Encounter (Signed)
Patient called needing a note from you with the dates you were approving to take her out of work.  Per the patient she needs the note before she can file FMLA.

## 2017-09-19 ENCOUNTER — Encounter: Payer: Self-pay | Admitting: Family Medicine

## 2017-09-19 DIAGNOSIS — M79602 Pain in left arm: Secondary | ICD-10-CM

## 2017-09-19 DIAGNOSIS — G8929 Other chronic pain: Secondary | ICD-10-CM | POA: Insufficient documentation

## 2017-09-19 NOTE — Progress Notes (Signed)
Attending Physician's Statement - Short Term Disability - Form Completion:  Company: [employer]  Patient unable to work due to disability - 10/08/17 through 01/08/18  Type of condition: [x]  Injury - Not due to work activity  Primary Diagnosis - Chronic Left Upper Extremity Pain (ICD10: M79.602, G89.29) Secondary conditions: - Cervical Spine Degenerative Joint Disease w/ Radiculopathy (ICD10: M47.812, M54.12) Subjective symptoms - Pain and weakness in Left arm into hand Objective physical findings (from 01/17/17 to 09/15/17 - see office visit notes) - Reduced active range of motion Left arm/shoulder especially flexion above shoulder, Reproduced radicular nerve symptoms into Left arm from neck  Pertinent Test results: 1. Cervical Spine X-ray (10/18/16) - Degenerative joint disease C5-6, loss of vertebral height 2. Left Shoulder X-ray (10/14/16) - Acromioclavicular [facility]) joint degenerative joint disease (DJD), no fracture or dislocation  Condition specific Medications/Dosage/Frequency - Previous treatments w/ NSAIDs (anti inflammatory) - Naproxen 500mg  BID, Meloxicam 15mg  daily, muscle relaxants Baclofen 10mg  TID, Gabapentin 300mg  TID, Prednisone dosepak taper PRN  Treatments: 1. Date your patient reported stopping work: 10/08/17 (anticipated date) 2. Date of Disability: approx 09/2017 3. Expected Return to Work Date: 01/08/18 (approx) 4. Date you first treated this patient: 02/09/16 (for unrelated condition) 5. Date you first treated this patient for this condition: 01/17/17 6. Date of reported onset of this condition: 06/25/15 7. Date of most recent treatment 09/15/17  How often has patient been seen/treated for this condition - 1-2 visits every 2-3 months Date of next office visit: 09/26/17  Current Treatment Plan: Failed most conservative medicine rx and PT, [facility]ine special[facility]) for evaluation of her Cervical spine and likely will need MRI C-spine  Has surgery been  performed? [x]  No Is surgery planned? [x]  No (maybe in future) Was patient hospitalized for this condition? [x]  no  Has patient been referred to any other physician? [x]  Yes - Cheryl CrowKatelyn Sanderson, PA (Ph: [phone]) - Specialty: Orthopedics ([facility]) - Cheryl SorJames Hooten MD, Van ClinesJon Wolfe PA, (Ph: [phone]) - Speciality; Orthopedics [facility]   ---------------------- Restrictions / Limitations: Cheryl Roberts- Baserd on office visit dated: 09/15/17  In an 8 hour period the patient is able to: - Continuously with standard breaks: [x]  Sit, Stand, Walk  Specific Activities: - Constantly 5.5 to 8 hours (normal function): [x]  Bend at waist, Balance, Fine manipulation  - Frequently 2.5 to 5.5 hours (partial restricton): [x]  Kneel/crouch, Drive - difficulty getting back up to standing, lifting shoulder prolonged limitation pain L arm  - Occasionally up to 2.5 hours (partial restriction): [x]  Climb, Gross manipulation - Requires support L arm / shoulder with L arm pain, repetitive tasks affected by weakness in left hand and shoulder  - Never 0 hours (complete restriction): [x]  Reach (extend arms above shoulder), Reach (extend arms below shoulder) - Due to cervical spine DJD, pain in L shoulder and limited left shoulder ROM, w/ radiculopathy  Expected duration of any restrictions/limitations: 3 months (approx) Current status: [x]  Retrogressed Additional comments - She has continued to work despite problem for >1 year, has not healed/improved, needs further treatment, testing and diagnostic per Orthopedic specialist, likely needs MRI  Does patient have psychiatric / cognitive impairment: [x]  No Patient has capacity to endorse checks / direct use of proceeds: [x]  Yes  Completed, signed, and dated Short Term Disability paperwork 09/19/17. To be scanned into chart and submitted via fax [phone]), patient received original copies of paperwork.  Cheryl PilarAlexander Karamalegos, DO [facility] 09/19/2017, 11:27 AM

## 2017-10-15 ENCOUNTER — Encounter: Payer: Self-pay | Admitting: Family Medicine

## 2017-10-15 ENCOUNTER — Other Ambulatory Visit: Payer: Self-pay | Admitting: Family Medicine

## 2017-10-15 ENCOUNTER — Ambulatory Visit (INDEPENDENT_AMBULATORY_CARE_PROVIDER_SITE_OTHER): Payer: Commercial Managed Care - PPO | Admitting: Family Medicine

## 2017-10-15 VITALS — BP 120/67 | HR 76 | Temp 98.8°F | Resp 16 | Ht <= 58 in | Wt 139.0 lb

## 2017-10-15 DIAGNOSIS — E1169 Type 2 diabetes mellitus with other specified complication: Secondary | ICD-10-CM

## 2017-10-15 DIAGNOSIS — E785 Hyperlipidemia, unspecified: Secondary | ICD-10-CM

## 2017-10-15 DIAGNOSIS — M7502 Adhesive capsulitis of left shoulder: Secondary | ICD-10-CM | POA: Insufficient documentation

## 2017-10-15 DIAGNOSIS — M722 Plantar fascial fibromatosis: Secondary | ICD-10-CM | POA: Diagnosis not present

## 2017-10-15 DIAGNOSIS — M79602 Pain in left arm: Secondary | ICD-10-CM | POA: Diagnosis not present

## 2017-10-15 DIAGNOSIS — Z0000 Encounter for general adult medical examination without abnormal findings: Secondary | ICD-10-CM

## 2017-10-15 DIAGNOSIS — G8929 Other chronic pain: Secondary | ICD-10-CM

## 2017-10-15 DIAGNOSIS — R7989 Other specified abnormal findings of blood chemistry: Secondary | ICD-10-CM

## 2017-10-15 DIAGNOSIS — I10 Essential (primary) hypertension: Secondary | ICD-10-CM

## 2017-10-15 DIAGNOSIS — E669 Obesity, unspecified: Secondary | ICD-10-CM

## 2017-10-15 DIAGNOSIS — R718 Other abnormality of red blood cells: Secondary | ICD-10-CM

## 2017-10-15 LAB — POCT GLYCOSYLATED HEMOGLOBIN (HGB A1C): HEMOGLOBIN A1C: 7.6 — AB (ref ?–5.7)

## 2017-10-15 NOTE — Assessment & Plan Note (Signed)
Relatively controlled, stable A1c 7.6 from prior 7.5 - overall remains improved no longer 8-9 Complicated by hyperlipidemia. No hypoglycemia.  Plan: 1. Continue current therapy - Jardiance  daily, Metformin XR  daily - future consider double dose metformin if tolerated 3. Encourage improved lifestyle - low carb, low sugar diet, reduce portion size, continue improving regular exercise with increased walking 3. Check CBG still, bring log to next visit for review 4. Continue ASA, Statin 5. Will need DM eye exam [facility] overdue again patient to call and schedule 6. Follow-up 5 months for annual + labs

## 2017-10-15 NOTE — Progress Notes (Signed)
Subjective:    Patient ID: Cheryl Roberts, female    DOB: 12-10-1970, 47 y.o.   MRN: [id number]  Cheryl Roberts is a 47 y.o. female presenting on 10/15/2017 for Diabetes   HPI  CHRONIC DM, Type 2: No concerns today CBGs: Similar to previous - Avg 130-150s, low >100, high < 200 - checks x 2 daily Meds: Metformin XR  daily, Jardiance  daily - tolerating well without side effects Currently on Aspirin, Atorvastatin (received letter from pharmacy, missed fill, due to having back log of rx on this, but she is taking atorvastatin daily) Lifestyle: - Diet: No significant change, still improving DM diet,  limiting bread. Admits occasional sweets with mini candy bar, eats dried nut and fruit packs. - Exercise: Increasing her regular walkingfew times weekly, still keeping up with regular walking exercise - Weight down 2 lbs since 1-2 months - [facility] to [facility] for last DM eye check 04/2016 - she is waiting on new insurance card for vision coverage Denies hypoglycemia, polyuria, visual changes, numbness or tingling.  Left Arm Pain, Chronic / Frozen Shoulder Requested short term disability, and has returned to [facility], seen again by Dr Ida Rogue 09/26/17, had x-ray imaging and exam dx Frozen shoulder, awaiting financial pending for MRI, then next visit 10/22/17 for injection in shoulder via sports med w/ US guidance, then back to therapy then will consider surgery in future  Plantar Fasciitis, Right Foot Recurrent issue in past with pain in inner aspect of R foot, worse after it is "tight" or inactive then walking hurts, needs to stretch it to improve. She saw [facility], not returned, interested to return to them.  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  Depression screen [facility] 2/9 10/15/2017 09/15/2017 03/03/2017  Decreased  Interest 0 0 0  Down, Depressed, Hopeless 0 0 1  PHQ - 2 Score 0 0 1  Difficult doing work/chores - - Not difficult at all    Social History   Tobacco Use  . Smoking status: Never Smoker  . Smokeless tobacco: Never Used  Substance Use Topics  . Alcohol use: Yes    Comment: occas wine or beer  . Drug use: No    Review of Systems Per HPI unless specifically indicated above     Objective:    BP 120/67   Pulse 76   Temp 98.8 F (37.1 C) (Oral)   Resp 16   Ht  (1.448 m)   Wt 139 lb (63 kg)   BMI 30.08 kg/m   Wt Readings from Last 3 Encounters:  10/15/17 139 lb (63 kg)  09/15/17 141 lb (64 kg)  09/07/17 139 lb (63 kg)    Physical Exam  Constitutional: She is oriented to person, place, and time. She appears well-developed and well-nourished. No distress.  Well-appearing, comfortable, cooperative, obese  HENT:  Head: Normocephalic and atraumatic.  Mouth/Throat: Oropharynx is clear and moist.  Eyes: Conjunctivae are normal. Right eye exhibits no discharge. Left eye exhibits no discharge.  Neck: Normal range of motion. Neck supple. No thyromegaly present.  Cardiovascular: Normal rate, regular rhythm, normal heart sounds and intact distal pulses.  No murmur heard. Pulmonary/Chest: Effort normal and breath sounds normal. No respiratory distress. She has no wheezes. She has no rales.  Musculoskeletal: She exhibits no edema.  Limited L shoulder active ROM  Right  Foot Mostly normal appearance, without erythema, edema, or abnormality. Has some peeling skin. Localized tenderness only mild currently not severe to medial aspect of R hindfoot arch/heel consistent with plantar fascia, able to ambulate and weight bear on it  Lymphadenopathy:    She has no cervical adenopathy.  Neurological: She is alert and oriented to person, place, and time.  Skin: Skin is warm and dry. No rash noted. She is not diaphoretic. No erythema.  Psychiatric: She has a normal mood and affect. Her  behavior is normal.  Well groomed, good eye contact, normal speech and thoughts  Nursing note and vitals reviewed.    Recent Labs    03/25/17 0807 06/25/17 0827 10/15/17 1646  HGBA1C 8.4* 7.5* 7.6*    Results for orders placed or performed in visit on 10/15/17  POCT HgB A1C  Result Value Ref Range   Hemoglobin A1C 7.6 (A) 5.7      Assessment & Plan:   Problem List Items Addressed This Visit    Adhesive capsulitis of left shoulder    Chronic problem Remains on short term disability Now resumed care by [facility] with plans for upcoming sports med US guided injection L shoulder, then future PT, re-eval MRI and may need surgery in future      Chronic pain of left upper extremity   Plantar fasciitis, right    Clinically consistent with subacute vs chronic R plantar fasciitis based on history and exam, localized pain.  - No known injury. Unlikely fracture based on history, no bony tenderness. - Limited conservative therapy currently  Plan: 1. Reviewed diagnosis and management of plantar fasciitis 2. May take Tylenol PRN breakthrough 3. May use topical muscle rub, ice 4. Emphasized importance of relative rest, ice, and avoid prolonged standing and overuse 5. Handout given and explained appropriate stretching and home exercises important to do first thing in morning and later 6. Follow-up as needed - recommend may contact [facility] again for re-eval future injection if need      Type 2 diabetes mellitus with other specified complication (HCC) - Primary    Relatively controlled, stable A1c 7.6 from prior 7.5 - overall remains improved no longer 8-9 Complicated by hyperlipidemia. No hypoglycemia.  Plan: 1. Continue current therapy - Jardiance  daily, Metformin XR  daily - future consider double dose metformin if tolerated 3. Encourage improved lifestyle - low carb, low sugar diet, reduce portion size, continue improving regular exercise with increased  walking 3. Check CBG still, bring log to next visit for review 4. Continue ASA, Statin 5. Will need DM eye exam [facility] overdue again patient to call and schedule 6. Follow-up 5 months for annual + labs      Relevant Orders   POCT HgB A1C (Completed)      No orders of the defined types were placed in this encounter.   Follow up plan: Return in about 5 months (around 03/17/2018) for Annual Physical.  Future labs ordered for 03/20/18  [name], DO [facility] 10/15/2017, 11:37 PM

## 2017-10-15 NOTE — Assessment & Plan Note (Signed)
Chronic problem Remains on short term disability Now resumed care by [facility] with plans for upcoming sports med US guided injection L shoulder, then future PT, re-eval MRI and may need surgery in future

## 2017-10-15 NOTE — Patient Instructions (Addendum)
Thank you for coming to the office today.  7.6, last time 7.5  Keep up the good work  No medication changes today   You most likely have Plantar Fasciitis of heel / foot. - This is inflammation of the fibrous connection on the bottom of the foot, and can have small micro tears over time that become painful. It usually will have flare ups lasting days to weeks, and may come back after it heals if it is re-aggravated again. - Often there is a bone spur or arthritis of the heel bone that causes this - Also it may be caused by abnormal footwear, walking pattern or other problems  If you are experiencing an acute flare with pain, this is usually worst first thing in the morning when the plantar fascia is tight and stiff. First step out of bed is painful usually, and it may gradually improve with stretching and walking.  Recommend: - Rest / relative rest with activity modification avoid overuse / prolonged stand - Ice packs (make sure you use a towel or sock / something to protect skin)  May also try topical muscle rub, icy hot, tiger balm  Start the exercises listed below, gradually increase them as instructed. May be sore at first and hopefully will stretch out and help reduce pain later.  If not improving after 1-2 weeks on medicine, and stretching exercises and some rest - then can contact our office again for re-evaluation may consider other causes or can refer you to Orthopedic specialist to have second opinion, and consider a steroid injection.  Gwyneth Revels, DPM  [facility] [address] [place], [place] [address] Hours - M-F 8-5 Phone: [phone] phone  DUE for FASTING BLOOD WORK (no food or drink after midnight before the lab appointment, only water or coffee without cream/sugar on the morning of)  SCHEDULE "Lab Only" visit in the morning at the clinic for lab draw in 5 MONTHS   - Make sure Lab Only appointment is at about 1 week before your next  appointment, so that results will be available  For Lab Results, once available within 2-3 days of blood draw, you can can log in to MyChart online to view your results and a brief explanation. Also, we can discuss results at next follow-up visit.   Please schedule a Follow-up Appointment to: Return in about 5 months (around 03/17/2018) for Annual Physical.  If you have any other questions or concerns, please feel free to call the office or send a message through MyChart. You may also schedule an earlier appointment if necessary.  Additionally, you may be receiving a survey about your experience at our office within a few days to 1 week by e-mail or mail. We value your feedback.  [name], DO [facility], [facility]              Plantar Fascia Stretches / Exercises  See other page with pictures of each exercise.  Start with 1 or 2 of these exercises that you are most comfortable with. Do not do any exercises that cause you significant worsening pain. Some of these may cause some "stretching soreness" but it should go away after you stop the exercise, and get better over time. Gradually increase up to 3-4 exercises as tolerated.  You may begin exercising the muscles of your foot right away by gently stretching them as follows:  Stretching: Towel stretch: Sit on a hard surface with your injured leg stretched out  in front of you. Loop a towel around the ball of your foot and pull the towel toward your body keeping your knee straight. Hold this position for 15 to 30 seconds then relax. Repeat 3 times. When the towel stretch becomes to easy, you may begin doing the standing calf stretch.  Standing calf stretch: Facing a wall, put your hands against the wall at about eye level. Keep the injured leg back, the uninjured leg forward, and the heel of your injured leg on the floor. Turn your injured foot slightly inward (as if you were pigeon-toed) as you slowly lean  into the wall until you feel a stretch in the back of your calf. Hold for 15 to 30 seconds. Repeat 3 times. Do this exercise several times each day. When you can stand comfortably on your injured foot, you can begin stretching the bottom of your foot using the plantar fascia stretch.  Plantar fascia stretch: Stand with the ball of your injured foot on a stair. Reach for the bottom step with your heel until you feel a stretch in the arch of your foot. Hold this position for 15 to 30 seconds and then relax. Repeat 3 times. After you have stretched the bottom muscles of your foot, you can begin strengthening the top muscles of your foot.  Frozen can roll: Roll your bare injured foot back and forth from your heel to your mid-arch over a frozen juice can. Repeat for 3 to 5 minutes. This exercise is particularly helpful if done first thing in the morning. Towel pickup: With your heel on the ground, pick up a towel with your toes. Release. Repeat 10 to 20 times. When this gets easy, add more resistance by placing a book or small weight on the towel. Static and dynamic balance exercises Place a chair next to your non-injured leg and stand upright. (This will provide you with balance if needed.) Stand on your injured foot. Try to raise the arch of your foot while keeping your toes on the floor. Try to maintain this position and balance on your injured side for 30 seconds. This exercise can be made more difficult by doing it on a piece of foam or a pillow, or with your eyes closed. Stand in the same position as above. Keep your foot in this position and reach forward in front of you with your injured side's hand, allowing your knee to bend. Repeat this 10 times while maintaining the arch height. This exercise can be made more difficult by reaching farther in front of you. Do 2 sets. Stand in the same position as above. While maintaining your arch height, reach the injured side's hand across your body toward the  chair. The farther you reach, the more challenging the exercise. Do 2 sets of 10.  Next, you can begin strengthening the muscles of your foot and lower leg by using elastic tubing.  Strengthening: Resisted dorsiflexion: Sit with your injured leg out straight and your foot facing a doorway. Tie a loop in one end of the tubing. Put your foot through the loop so that the tubing goes around the arch of your foot. Tie a knot in the other end of the tubing and shut the knot in the door. Move backward until there is tension in the tubing. Keeping your knee straight, pull your foot toward your body, stretching the tubing. Slowly return to the starting position. Do 3 sets of 10. Resisted plantar flexion: Sit with your leg outstretched and loop  the middle section of the tubing around the ball of your foot. Hold the ends of the tubing in both hands. Gently press the ball of your foot down and point your toes, stretching the tubing. Return to the starting position. Do 3 sets of 10. Resisted inversion: Sit with your legs out straight and cross your uninjured leg over your injured ankle. Wrap the tubing around the ball of your injured foot and then loop it around your uninjured foot so that the tubing is anchored there at one end. Hold the other end of the tubing in your hand. Turn your injured foot inward and upward. This will stretch the tubing. Return to the starting position. Do 3 sets of 10. Resisted eversion: Sit with both legs stretched out in front of you, with your feet about a shoulder's width apart. Tie a loop in one end of the tubing. Put your injured foot through the loop so that the tubing goes around the arch of that foot and wraps around the outside of the uninjured foot. Hold onto the other end of the tubing with your hand to provide tension. Turn your injured foot up and out. Make sure you keep your uninjured foot still so that it will allow the tubing to stretch as you move your injured foot. Return to  the starting position. Do 3 sets of 10.

## 2017-10-15 NOTE — Assessment & Plan Note (Signed)
Clinically consistent with subacute vs chronic R plantar fasciitis based on history and exam, localized pain.  - No known injury. Unlikely fracture based on history, no bony tenderness. - Limited conservative therapy currently  Plan: 1. Reviewed diagnosis and management of plantar fasciitis 2. May take Tylenol PRN breakthrough 3. May use topical muscle rub, ice 4. Emphasized importance of relative rest, ice, and avoid prolonged standing and overuse 5. Handout given and explained appropriate stretching and home exercises important to do first thing in morning and later 6. Follow-up as needed - recommend may contact [facility] again for re-eval future injection if need

## 2017-11-06 ENCOUNTER — Telehealth: Payer: Self-pay | Admitting: Family Medicine

## 2017-11-06 NOTE — Telephone Encounter (Signed)
UPDATES 11/06/17 - Received return call from Einar Crow, PA [facility] - she reports that patient was seen in 09/26/17 for L shoulder pain, dx adhesive capsulitis, and then referred to get injection in shoulder by Dr Clydene Pugh, this was done on 10/22/17, patient has not followed up with [facility] since then, instead patient has seen Dr Myles Gip at [facility] internal medicine as she was seen there before to get charity care processed for MRI  - Therefore, [facility] does not plan to make any specific recommendations on her return to work or extension of short term disability, she recommended generally mostly lifting restrictions, but would need to follow-up with her to determine further, they recommended to follow-up with Dr Barnetta Chapel since now it seems problem is more radiculopathy symptoms and patient may be referred to Spine Center  - I called Dr Zollie Scale office [facility] and spoke with clinical staff, Trula Ore, for Dr Barnetta Chapel and reviewed this patient's case, and I have requested that they complete the attending physician statement and paperwork to determine patient's extension of leave while they plan to proceed with PT and Cervical Spine MRI - I have faxed them copy of the letter from [facility] and due date of 12/03/17, also I have contacted [facility] and provided them the provider information for Dr Barnetta Chapel contact info and they will fax form for attending statement to them.  [name], DO [facility] 11/06/2017, 12:40 PM

## 2017-11-06 NOTE — Telephone Encounter (Signed)
UPDATE 11/06/17 5pm  I have now spoken with Dr Barnetta Chapel at [facility], she called back this afternoon, reviewed entire case with her, she states that she has not actually been involved in managing the patient's current problem, and states there was confusion that the patient returned to her because she is not her PCP. Patient is at [facility] through [facility], but prefers to keep me as her PCP.  She stated that she has ordered the Cervical Spine MRI since it was not placed by [facility]. This has not been scheduled yet, it was ordered on 5/22.  She also referred patient to Physical Therapy - seen at [facility] and has regimen to continue, seen on 5/29, then next 5/31 she will continue now for approximate timeframe of 4 months.  Dr Barnetta Chapel does not feel comfortable completing the Attending Physician Statement since she is not further managing this problem and is not her PCP.  I agree to continue to manage this patient as her PCP and I will complete the Attending Statement from [facility], will need to request copy of the paperwork from them. Also we will contact [facility] PT and determine patient's limitations, or perhaps they can assist in completing this form.  Pending C-spine MRI, results will be routed to me and then next step will anticipate referral to [facility] Neurosurgery vs Ortho Spine - recommended to Dr Henreitta Leber.  [name], DO [facility] 11/06/2017, 5:17 PM

## 2017-11-06 NOTE — Telephone Encounter (Signed)
Yesterday 11/05/17 received fax from [facility] regarding this patient's current short term disability case, the benefits were approved from 10/08/17 through 11/23/17 - however they state if extension is requested they will need updated clinical and medical records and information / attending physician statement completed and returned - required to be received by 12/03/17 or else claim will be closed.  I called [employer] [facility] reviewed case, and they were faxing [place] a copy of the requested paperwork and also stated they could send a copy to the [facility] as well Einar Crow PA, as any treating physician can complete the paperwork if they are choosing to keep her out longer.  I called patient and clarified with her, she is unable to return still, in 09/2017 after diagnosis of "Frozen Shoulder" received shoulder injection from [facility], with limited relief, and also she was seen by [facility] Dr Barnetta Chapel and sent for PT which has now been initiated, she is going weekly for now and this will increase in frequency, they are still working on approving or scheduling MRI shoulder for her, and she is to follow-up with [facility].  I attempted to call [facility] today to review this case with Einar Crow PA - I left a voicemail, and will wait to hear back, will determine if they are able to write her paperwork extension from 11/23/17 to 01/08/18 or if we will need to complete it and if we can obtain the treatment plan: - Limitations, frequency of follow-up, treatment planned / imaging, and follow-up  [name], DO [facility] 11/06/2017, 9:07 AM

## 2017-11-13 NOTE — Telephone Encounter (Signed)
UPDATE 11/13/17  Spoke with Payton DoughtySarah Van Der Hildred LaserHorst, PT [facility] earlier this week, reviewed patient's diagnosis and progress and symptoms, she confirms Adhesive Capsulitis L Shoulder, in freezing stage with stiffness and pain, continues with visits 1-2 x weekly, as scheduled up to 3-4 months for goal to improve, so far some in session improvement.  Also she reported seems she did not receive full steroid injection intraarticular shoulder joint recently had problem with pain and did not receive full injection. They may repeat this in future.  L Shoulder MRI is still pending charity care application.  I have completed Attending Physician Statement Progress Report today 11/13/17 - it is to be faxed and scanned.  Saralyn PilarAlexander Smera Guyette, DO [facility] 11/13/2017, 1:24 PM

## 2017-11-22 ENCOUNTER — Other Ambulatory Visit: Payer: Self-pay

## 2017-11-22 ENCOUNTER — Emergency Department
Admission: EM | Admit: 2017-11-22 | Discharge: 2017-11-22 | Disposition: A | Discharge status: Home / Self Care | Payer: Commercial Managed Care - PPO | Attending: Emergency Medicine | Admitting: Emergency Medicine

## 2017-11-22 ENCOUNTER — Encounter: Payer: Self-pay | Admitting: Emergency Medicine

## 2017-11-22 ENCOUNTER — Emergency Department: Payer: Commercial Managed Care - PPO

## 2017-11-22 DIAGNOSIS — I10 Essential (primary) hypertension: Secondary | ICD-10-CM | POA: Diagnosis not present

## 2017-11-22 DIAGNOSIS — E119 Type 2 diabetes mellitus without complications: Secondary | ICD-10-CM | POA: Insufficient documentation

## 2017-11-22 DIAGNOSIS — R079 Chest pain, unspecified: Secondary | ICD-10-CM | POA: Diagnosis not present

## 2017-11-22 DIAGNOSIS — J45909 Unspecified asthma, uncomplicated: Secondary | ICD-10-CM | POA: Diagnosis not present

## 2017-11-22 LAB — CBC
HCT: 41.7 % (ref 35.0–47.0)
Hemoglobin: 13.6 g/dL (ref 12.0–16.0)
MCH: 26.7 pg (ref 26.0–34.0)
MCHC: 32.6 g/dL (ref 32.0–36.0)
MCV: 82 fL (ref 80.0–100.0)
PLATELETS: 293 10*3/uL (ref 150–440)
RBC: 5.08 MIL/uL (ref 3.80–5.20)
RDW: 14.9 % — ABNORMAL HIGH (ref 11.5–14.5)
WBC: 9.1 10*3/uL (ref 3.6–11.0)

## 2017-11-22 LAB — COMPREHENSIVE METABOLIC PANEL
ALK PHOS: 52 U/L (ref 38–126)
ALT: 33 U/L (ref 14–54)
ANION GAP: 10 (ref 5–15)
AST: 31 U/L (ref 15–41)
Albumin: 4.7 g/dL (ref 3.5–5.0)
BUN: 22 mg/dL — ABNORMAL HIGH (ref 6–20)
CALCIUM: 9.2 mg/dL (ref 8.9–10.3)
CHLORIDE: 97 mmol/L — AB (ref 101–111)
CO2: 27 mmol/L (ref 22–32)
Creatinine, Ser: 0.69 mg/dL (ref 0.44–1.00)
Glucose, Bld: 152 mg/dL — ABNORMAL HIGH (ref 65–99)
Potassium: 3.4 mmol/L — ABNORMAL LOW (ref 3.5–5.1)
Sodium: 134 mmol/L — ABNORMAL LOW (ref 135–145)
Total Bilirubin: 0.6 mg/dL (ref 0.3–1.2)
Total Protein: 7.7 g/dL (ref 6.5–8.1)

## 2017-11-22 LAB — TROPONIN I

## 2017-11-22 MED ORDER — ASPIRIN 81 MG PO CHEW
324.0000 mg | CHEWABLE_TABLET | Freq: Once | ORAL | Status: AC
  Administered 2017-11-22: 324 mg via ORAL
  Filled 2017-11-22: qty 4

## 2017-11-22 NOTE — Discharge Instructions (Addendum)
Continue to take all your medications as prescribed, including her daily aspirin.  Please make an appointment with your primary care physician for follow-up.  Please also see the cardiologist to be evaluated for an outpatient stress test; Dr. Welton FlakesKhan will see you at 1 PM on Monday.  Please stop taking Ibuprofen and take Tylenol for any pain instead; the Ibuprofen could be contributing to your pain as it can irritate the lining of your stomach.  Return to the emergency department if you develop severe pain, lightheadedness or fainting, shortness of breath, palpitations, or any other symptoms concerning to you.

## 2017-11-22 NOTE — ED Triage Notes (Signed)
L chest pain x "few days", unable to states what day it started. Denies injury, denies increase with inspiration.

## 2017-11-22 NOTE — ED Notes (Signed)
Pt taken to xray via stretcher  

## 2017-11-22 NOTE — ED Provider Notes (Signed)
[facility] Emergency Department Provider Note  ____________________________________________  Time seen: Approximately 8:59 AM  I have reviewed the triage vital signs and the nursing notes.   HISTORY  Chief Complaint Chest Pain    HPI Cheryl Roberts is a 47 y.o. female with a history of HTN, HL, and DM, no known CAD, presenting with chest pain.  The patient reports that over the last several days, she has had 3 separate episodes of a "crampy" sensation in the epigastrium and under the left breast.  These usually occur while at rest, last approximately 1 minute, and resolve spontaneously.  She does have a history of reflux, and this does not feel similar.  She denies any associated radiation, lightheadedness or syncope, palpitations, shortness of breath, diaphoresis, nausea or vomiting.  The symptoms are not related to exertion, food or position.  She denies any lower extremity swelling or calf pain, or exertional strain.  She does take "a lot" of ibuprofen.  Past Medical History:  Diagnosis Date  . Asthma   . Cervical radiculopathy   . Chronic sinusitis   . Diabetes mellitus without complication (HCC)   . Hyperlipidemia   . Hypertension     Patient Active Problem List   Diagnosis Date Noted  . Adhesive capsulitis of left shoulder 10/15/2017  . Plantar fasciitis, right 10/15/2017  . Chronic pain of left upper extremity 09/19/2017  . Arthralgia of right temporomandibular joint 06/11/2017  . Pigmented skin lesion 06/11/2017  . 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 Cervical radiculopathy 01/17/2017  . Chronic low back pain without sciatica 06/22/2016  . Strain of left trapezius muscle 06/22/2016  . Allergic rhinitis due to allergen 03/26/2016  . Hyperlipidemia associated with type 2 diabetes mellitus (HCC) 03/15/2016  . Hypertension 02/09/2016  . Type 2 diabetes mellitus with other specified complication (HCC) 02/09/2016  . DJD  (degenerative joint disease) of cervical spine 02/09/2016  . Obesity (BMI 30.0-34.9) 02/09/2016  . Asthma, mild intermittent 02/09/2016  . Sialoadenitis 02/28/2003    History reviewed. No pertinent surgical history.  Current Outpatient Rx  . Order #: [id number] Class: Print  . Order #: [id number] Class: OTC  . Order #: [id number] Class: Normal  . Order #: [id number] Class: Normal  . Order #: [id number] Class: Normal  . Order #: [id number] Class: Historical Med  . Order #: [id number] Class: Normal  . Order #: [id number] Class: Normal  . Order #: [id number] Class: Print  . Order #: [id number] Class: Normal  . Order #: [id number] Class: Normal  . Order #: [id number] Class: Historical Med  . Order #: [id number] Class: Normal  . Order #: [id number] Class: Normal  . Order #: [id number] Class: Normal  . Order #: [id number] Class: Normal  . Order #: [id number] Class: Normal  . Order #: [id number] Class: Historical Med    Allergies Amoxicillin  Family History  Problem Relation Age of Onset  . Hypertension Mother   . Hyperthyroidism Mother   . Gout Mother   . Diabetes Mother   . Arthritis Mother   . Cancer Father     Social History Social History   Tobacco Use  . Smoking status: Never Smoker  . Smokeless tobacco: Never Used  Substance Use Topics  . Alcohol use: Yes    Comment: occas wine or beer  . Drug use: No    Review of Systems Constitutional: No fever/chills.  Lightheadedness or syncope.  No diaphoresis.  No strain. Eyes: No visual changes. ENT: No sore throat. No congestion or rhinorrhea. Cardiovascular: Positive chest pain. Denies palpitations. Respiratory: Denies  shortness of breath.  No cough. Gastrointestinal: No abdominal pain.  No nausea, no vomiting.  No diarrhea.  No constipation. Genitourinary: Negative for dysuria. Musculoskeletal: Negative for back pain. Skin: Negative for rash. Neurological: Negative for headaches. No focal numbness, tingling or weakness.      ____________________________________________   PHYSICAL EXAM:  VITAL SIGNS: ED Triage Vitals  Enc Vitals Group     BP 11/22/17 0754 (!) 153/77     Pulse Rate 11/22/17 0754 95     Resp 11/22/17 0754 20     Temp 11/22/17 0754 98.4 F (36.9 C)     Temp Source 11/22/17 0754 Oral     SpO2 11/22/17 0754 100 %     Weight 11/22/17 0755 138 lb (62.6 kg)     Height 11/22/17 0755 4\' 9"  (1.448 m)     Head Circumference --      Peak Flow --      Pain Score 11/22/17 0755 1     Pain Loc --      Pain Edu? --      Excl. in GC? --     Constitutional: Alert and oriented.  Chronically ill appearing and nontoxic. Answers questions appropriately. Eyes: Conjunctivae are normal.  EOMI. No scleral icterus. Head: Atraumatic. Nose: No congestion/rhinnorhea. Mouth/Throat: Mucous membranes are moist.  Neck: No stridor.  Supple.  No JVD.  No meningismus. Cardiovascular: Normal rate, regular rhythm. No murmurs, rubs or gallops.  Respiratory: Normal respiratory effort.  No accessory muscle use or retractions. Lungs CTAB.  No wheezes, rales or ronchi. Gastrointestinal: Obese.  Soft, nontender and nondistended.  No guarding or rebound.  No peritoneal signs. Musculoskeletal: No LE edema. No ttp in the calves or palpable cords.  Negative Homan's sign. Neurologic:  A&Ox3.  Speech is clear.  Face and smile are symmetric.  EOMI.  Moves all extremities well. Skin:  Skin is warm, dry and intact. No rash noted. Psychiatric: Mood and affect are normal. Speech and behavior are normal.  Normal judgement  ____________________________________________   LABS (all labs ordered are listed, but only abnormal results are displayed)  Labs Reviewed  CBC - Abnormal; Notable for the following components:      Result Value   RDW 14.9 (*)    All other components within normal limits  COMPREHENSIVE METABOLIC PANEL - Abnormal; Notable for the following components:   Sodium 134 (*)    Potassium 3.4 (*)    Chloride 97  (*)    Glucose, Bld 152 (*)    BUN 22 (*)    All other components within normal limits  TROPONIN I  POC URINE PREG, ED   ____________________________________________  EKG  ED ECG REPORT I, Rockne Menghini, the attending physician, personally viewed and interpreted this ECG.   Date: 11/22/2017  EKG Time: 756  Rate: 89  Rhythm: normal sinus rhythm  Axis: normal  Intervals:none  ST&T Change: No STEMI; evidence of Brugada syndrome, prolonged QTC or hypertrophy.  ____________________________________________  RADIOLOGY  Dg Chest 2 View  Result Date: 11/22/2017 CLINICAL DATA:  Central chest pressure for 2-3 days. EXAM: CHEST - 2 VIEW COMPARISON:  01/27/2017. FINDINGS: Trachea is midline. Heart size normal. Lungs are clear. No pleural fluid. IMPRESSION: Negative. Electronically Signed   By: Leanna Battles M.D.   On: 11/22/2017 08:40    ____________________________________________   PROCEDURES  Procedure(s) performed: None  Procedures  Critical Care performed: No ____________________________________________   INITIAL IMPRESSION / ASSESSMENT AND PLAN / ED COURSE  Pertinent labs &  imaging results that were available during my care of the patient were reviewed by me and considered in my medical decision making (see chart for details).  47 y.o. female with a history of HTN, HL, DM presenting with 3 chest pain episodes.  Overall, the patient is mildly hypertensive at 153/77 but otherwise hemodynamically stable.  Her cardiopulmonary examination is reassuring.  The etiology of her chest pain could be due to many different causes.  At this time, I do not see any evidence of hemodynamic instability, ACS or MI; her EKG does not show any ischemia and her chest x-ray does not show any acute cardiopulmonary process.  Her symptoms would be atypical for MI.  However, she has never had a stress test in the past and I have spoken with Dr. Ursula BeathShaukat Kahn, the cardiologist on-call, who will  see her in the office for reevaluation on Monday at 1 PM.  Patient does self-report a large use of NSAIDs, so I will ask her to stop this as her chest discomfort could be due to irritation of the gastric lining, peptic ulcer disease, gastric ulcers disease or reflux as well.  The patient will receive full aspirin in the emergency department and plan reevaluation for final disposition.  ----------------------------------------- 9:40 AM on 11/22/2017 -----------------------------------------  The patient's troponin is less than 0.03.  In addition, she continues to be chest pain-free.  The patient's laboratory studies show a sodium of 134 and a potassium of 3.4, as well as hyperglycemia with a glucose of 152; there is no clinical concern with these abnormalities and no further testing is required at this time.  The patient's blood counts are also normal.  Her chest x-ray does not show any acute cardiopulmonary process.  At this time, the patient is safe for discharge home.  Return precautions as well as follow-up instructions have been discussed.    ____________________________________________  FINAL CLINICAL IMPRESSION(S) / ED DIAGNOSES  Final diagnoses:  Chest pain, unspecified type         NEW MEDICATIONS STARTED DURING THIS VISIT:  New Prescriptions   No medications on file      Rockne MenghiniNorman, Anne-Caroline, MD 11/22/17 [phone]

## 2017-11-27 ENCOUNTER — Other Ambulatory Visit: Payer: Self-pay

## 2017-11-27 ENCOUNTER — Encounter: Payer: Self-pay | Admitting: Family Medicine

## 2017-11-27 ENCOUNTER — Ambulatory Visit (INDEPENDENT_AMBULATORY_CARE_PROVIDER_SITE_OTHER): Payer: Commercial Managed Care - PPO | Admitting: Family Medicine

## 2017-11-27 VITALS — BP 128/81 | HR 83 | Temp 98.6°F | Ht <= 58 in | Wt 140.8 lb

## 2017-11-27 DIAGNOSIS — R079 Chest pain, unspecified: Secondary | ICD-10-CM

## 2017-11-27 DIAGNOSIS — M7502 Adhesive capsulitis of left shoulder: Secondary | ICD-10-CM

## 2017-11-27 NOTE — Patient Instructions (Addendum)
Thank you for coming to the office today.  You were evaluated recently in the ED for Chest Pain.  I have reviewed this record, and as discussed with you, it seems that your test results were not consistent of a heart attack or any heart abnormality. Tests included EKG, Chest X-ray, and blood tests, including heart enzyme test.  Stay tuned for further updates from your Cardiologist.  - It is safe to take Tylenol Ext Str 500mg  tabs - take 1 to 2 (max dose 1000mg ) every 6 hours as needed for breakthrough pain, max 24 hour daily dose is 6 to 8 tablets or 4000mg   If you have any significant chest pain that does not go away within 30 minutes, is accompanied by nausea, sweating, shortness of breath, or made worse by activity, this may be evidence of a heart attack, especially if symptoms worsening instead of improving, please call 911 or go directly to the emergency room immediately for evaluation.  We may need you to return within 1 month for next disability assessment if need to extend and change to long term.  Please schedule a Follow-up Appointment to: Return in about 1 month (around 12/25/2017), or if symptoms worsen or fail to improve, for left shoulder pain, disability form.  If you have any other questions or concerns, please feel free to call the office or send a message through MyChart. You may also schedule an earlier appointment if necessary.  Additionally, you may be receiving a survey about your experience at our office within a few days to 1 week by e-mail or mail. We value your feedback.  Saralyn PilarAlexander Jama Mcmiller, DO [name]

## 2017-11-27 NOTE — Progress Notes (Signed)
Subjective:    Patient ID: Cheryl Roberts, female    DOB: [birth year], 47 y.o.   MRN: [id number]  Cheryl Roberts is a 47 y.o. female presenting on 11/27/2017 for Chest Pain (left side chest pain, pt was seen in the ER on Saturday, June 15th. She was also seen by Cardiologist Dr. Adrian Blackwater on Monday, June 17th)   HPI   ED FOLLOW-UP VISIT  Hospital/Location: [facility] Date of ED Visit: 11/22/17  Reason for Presenting to ED: Chest Pain Primary (+Secondary) Diagnosis: Unspecified Chest Pain  FOLLOW-UP CHEST PAIN - ED provider note and record have been reviewed - Patient presents today about 5 days after recent ED visit. Brief summary of recent course, patient had symptoms of left side lower chest wall / epigastric that were episodic over several days, radiated into area of under left breast for brief episodes 1 min spontaneously resolve - not related to exertion - felt more of a "swelling" under left rib and it was cramping - presented to ED on 11/22/17, testing in ED with EKG, labs CMET, CBC, Chest X-ray and troponin unremarkable work up, treated with advised to stop or reduce high dose NSAIDs as these could affect her chest pain or cause PUD. She was scheduled w/ Cardiology Dr Welton Flakes  Interval 11/24/17 - She saw Dr Welton Flakes Cardiology, they did repeat EKG on her at that time in office and there was some questionable mild abnormality. She is scheduled to go back on July 1st for a Stress Test and then on July 8 she will have an ECHO.  - Today reports overall has done well after discharge from ED. Symptoms of chest pain / upper abdominal discomfort have resolved - she has discontinued ibuprofen now for 4 days, doing better  - New medications on discharge: none - Changes to current meds on discharge: DC'd Ibuprofen  Additional updates Chronic Left Shoulder Pain, Adhesive Capsulitis - Remains on Short Term Disability  - Overall she admits that still same spot keeps bothering her, but somewhat  improved continues to do PT x 2 weekly - Last visit with [facility] PT was 11/26/17 - had some increased stiffness by report, some increased pain at first but pain improved by end of visit - She is scheduled for MRI 01/03/18 - She is approved by Short Term Disability through 12/23/17   Depression screen [facility] 2/9 10/15/2017 09/15/2017 03/03/2017  Decreased Interest 0 0 0  Down, Depressed, Hopeless 0 0 1  PHQ - 2 Score 0 0 1  Difficult doing work/chores - - Not difficult at all    Social History   Tobacco Use  . Smoking status: Never Smoker  . Smokeless tobacco: Never Used  Substance Use Topics  . Alcohol use: Yes    Comment: occas wine or beer  . Drug use: No    Review of Systems Per HPI unless specifically indicated above     Objective:    BP 128/81 (BP Location: Right Arm, Patient Position: Sitting, Cuff Size: Normal)   Pulse 83   Temp 98.6 F (37 C) (Oral)   Ht 4\' 9"  (1.448 m)   Wt 140 lb 12.8 oz (63.9 kg)   LMP 11/15/2017   BMI 30.47 kg/m   Wt Readings from Last 3 Encounters:  11/27/17 140 lb 12.8 oz (63.9 kg)  11/22/17 138 lb (62.6 kg)  10/15/17 139 lb (63 kg)    Physical Exam  Constitutional: She is oriented to person, place, and time. She appears well-developed and  well-nourished. No distress.  Well-appearing, comfortable, cooperative, obese  HENT:  Head: Normocephalic and atraumatic.  Mouth/Throat: Oropharynx is clear and moist.  Eyes: Conjunctivae are normal. Right eye exhibits no discharge. Left eye exhibits no discharge.  Neck: Normal range of motion. Neck supple. No thyromegaly present.  Cardiovascular: Normal rate, regular rhythm, normal heart sounds, intact distal pulses and normal pulses.  No murmur heard. Pulmonary/Chest: Effort normal and breath sounds normal. No respiratory distress. She has no wheezes. She has no rales.  Abdominal: Soft. Bowel sounds are normal. She exhibits no distension. There is no tenderness.  Musculoskeletal: She exhibits no edema.        Right lower leg: She exhibits no edema.       Left lower leg: She exhibits no tenderness (No reproduced tenderness of chest wall, resolved area of tenderness left lower rib).  Left Shoulder Inspection: Normal appearance bilateral symmetrical Palpation: Non-tender to palpation over anterior, lateral, or posterior shoulder  ROM: Interval improved active ROM forward flexion slightly above shoulder level, still significantly limited internal rotation unable to go behind back, still limited abduction but marginally improve - limited by pain and stiffness Strength: Distally intact Neurovascular: Distally intact pulses, sensation to light touch   Lymphadenopathy:    She has no cervical adenopathy.  Neurological: She is alert and oriented to person, place, and time.  Skin: Skin is warm and dry. No rash noted. She is not diaphoretic. No erythema.  Psychiatric: She has a normal mood and affect. Her behavior is normal.  Well groomed, good eye contact, normal speech and thoughts  Nursing note and vitals reviewed.  Results for orders placed or performed during the hospital encounter of 11/22/17  CBC  Result Value Ref Range   WBC 9.1 3.6 - 11.0 K/uL   RBC 5.08 3.80 - 5.20 MIL/uL   Hemoglobin 13.6 12.0 - 16.0 g/dL   HCT 16.141.7 09.635.0 - 04.547.0 %   MCV 82.0 80.0 - 100.0 fL   MCH 26.7 26.0 - 34.0 pg   MCHC 32.6 32.0 - 36.0 g/dL   RDW 40.914.9 (H) 81.111.5 - 91.414.5 %   Platelets 293 150 - 440 K/uL  Comprehensive metabolic panel  Result Value Ref Range   Sodium 134 (L) 135 - 145 mmol/L   Potassium 3.4 (L) 3.5 - 5.1 mmol/L   Chloride 97 (L) 101 - 111 mmol/L   CO2 27 22 - 32 mmol/L   Glucose, Bld 152 (H) 65 - 99 mg/dL   BUN 22 (H) 6 - 20 mg/dL   Creatinine, Ser 7.820.69 0.44 - 1.00 mg/dL   Calcium 9.2 8.9 - 95.610.3 mg/dL   Total Protein 7.7 6.5 - 8.1 g/dL   Albumin 4.7 3.5 - 5.0 g/dL   AST 31 15 - 41 U/L   ALT 33 14 - 54 U/L   Alkaline Phosphatase 52 38 - 126 U/L   Total Bilirubin 0.6 0.3 - 1.2 mg/dL   GFR calc non  Af Amer >60 >60 mL/min   GFR calc Af Amer >60 >60 mL/min   Anion gap 10 5 - 15  Troponin I  Result Value Ref Range   Troponin I <0.03 <0.03 ng/mL      Assessment & Plan:   Problem List Items Addressed This Visit    Adhesive capsulitis of left shoulder  Mostly stable to slightly improved Continue current treatment plan of frequent PT via [facility] 1-2x weekly, adjust schedule as needed Follow-up with Cervical Spine MRI on 01/03/18 - then needs to  return to [facility] Ortho vs Spine Specialist for further interpretation - Limited NSAIDs, emphasis on Tylenol, muscle relaxants  Retirm within 3-4 weeks for re-evaluation to determine disability extension - may need to transition to Long Term Disability     Other Visit Diagnoses    Chest pain, unspecified type    -  Primary  Resolved atypical CP Suspect related to NSAID, now off Reassuring ED work-up reviewed Already followed by Cardiology Dr Welton Flakes, has first eval, now has Stress Test and ECHO scheduled within 1-2 weeks Return precautions given if return and severe      No orders of the defined types were placed in this encounter.     Follow up plan: Return in about 1 month (around 12/25/2017), or if symptoms worsen or fail to improve, for left shoulder pain, disability form.  [name], DO [facility] 11/27/2017, 4:32 PM

## 2017-12-07 ENCOUNTER — Other Ambulatory Visit: Payer: Self-pay | Admitting: Family Medicine

## 2017-12-08 ENCOUNTER — Telehealth: Payer: Self-pay | Admitting: Family Medicine

## 2017-12-08 DIAGNOSIS — M4722 Other spondylosis with radiculopathy, cervical region: Secondary | ICD-10-CM

## 2017-12-08 DIAGNOSIS — E1169 Type 2 diabetes mellitus with other specified complication: Secondary | ICD-10-CM

## 2017-12-08 DIAGNOSIS — M5412 Radiculopathy, cervical region: Secondary | ICD-10-CM

## 2017-12-08 DIAGNOSIS — M4802 Spinal stenosis, cervical region: Secondary | ICD-10-CM

## 2017-12-08 MED ORDER — JARDIANCE 10 MG PO TABS: 10.0000 mg | ORAL_TABLET | Freq: Every day | ORAL | 11 refills | Status: DC

## 2017-12-08 NOTE — Telephone Encounter (Signed)
Pt called requesting refill on [name]  10 mg  [facility]  [name] [facility]  Also said  She had her MRI   Last  Thursday

## 2017-12-08 NOTE — Telephone Encounter (Signed)
Previously taking generic Empaglifozin 25mg  daily - back from 03/2017, uncertain when patient switched or if she could not get that one approved, but she has been getting the name brand Jardiance 10mg  daily approved and covered [facility] [address], and requested refill to continue.  Also I reviewed with her MRI [facility]. I have already discussed results with ordering provider [facility], now I have reviewed result, and agree patient will benefit from [facility], as recommended Dr Henreitta LeberMoe Lim will place referral and we will work on scheduling, [facility] as well.  Patient was notified of her MRI results and she agrees with plan to pursue Spine Specialist, as this in conjunction with her Left shoulder adhesive capsulitis are likely etiology of her pain and symptoms, keeping her on short term disability.  She will likely need to return before current disability runs out to determine if she qualifies for long term disability until this problem can be truly treated, and consult Spine Specialist.  [facility]  Interface, Rad Results In - 12/05/2017 12:52 PM EDT EXAM: Magnetic resonance imaging, spinal canal and contents, cervical without contrast material. DATE: 12/04/2017 3:50 PM ACCESSION: [id number] UN DICTATED: 12/04/2017 4:02 PM INTERPRETATION LOCATION: Main Campus  CLINICAL INDICATION: 47 years old Female with PAIN CERVICAL SPINE  - CERVICALGIA  - G54.2 - Cervical nerve root compression - M54.2 - Neck pain.  COMPARISON: Cervical spine radiograph 06/30/2015.  TECHNIQUE: Multiplanar multisequence MRI was performed through the cervical spine without intravenous contrast.  FINDINGS:  Some of the sequences are degraded by patient motion artifact.  Straightening and slight reversal of the cervical lordosis. The signal intensity from the vertebral  body bone marrow and spinal cord is normal. Apparent signal hyperintensity overlying the cord on the axial GRE sequences is probably artifactual.  Mild developmental stenosis of the spinal canal.  Multilevel degenerative disc disease, as characterized by intervertebral disc desiccation, loss of height, endplate irregularity and osteophytosis. Additional details as follows:  C2-C3: Small central disc protrusion contributing to mild spinal canal narrowing. No significant neuroforaminal narrowing.  C3-C4: Disc osteophyte complex effaces the ventral CSF space, and contributes to moderate spinal canal and bilateral neuroforaminal narrowing.  C4-C5: Disc osteophyte complex effaces the ventral CSF space, and contributes to moderate spinal canal and severe bilateral neural foraminal narrowing.  C5-C6: Disc osteophyte complex asymmetric to the left paracentral zone contributes to moderate spinal canal and moderate to marked bilateral neuroforaminal narrowing. There is indentation of the left ventral cord which appears mildly flattened.  C6-C7: Disc osteophyte complex and uncovertebral hypertrophy contribute to mild left neural foraminal narrowing. No significant spinal canal or right neuroforaminal narrowing.  C7-T1: No significant disc bulge, spinal canal or neuroforaminal narrowing.  IMPRESSION:  -- Multilevel degenerative changes contribute to multilevel spinal canal narrowing most prominent at C3-C4 through C5-C6, compounded by developmental spinal canal stenosis. Varying degrees of neural foraminal stenosis worst at C4-5 and C5-6.  Saralyn PilarAlexander Karamalegos, DO [facility] 12/08/2017, 5:06 PM

## 2017-12-10 ENCOUNTER — Telehealth: Payer: Self-pay | Admitting: Family Medicine

## 2017-12-10 NOTE — Telephone Encounter (Signed)
Thank you.  She had an [facility] - results are in CareEverywhere.  If this could be sent to them as well or if they can be notified that this was already done through [facility]  Alexander Karamalegos, DO [facility] 12/10/2017, 2:59 PM

## 2017-12-10 NOTE — Telephone Encounter (Signed)
I sent the referral to Dr Henreitta LeberMoe Lim.  His office stated that they must have a mri within 6 months.

## 2017-12-15 ENCOUNTER — Encounter: Payer: Self-pay | Admitting: Family Medicine

## 2017-12-15 NOTE — Progress Notes (Addendum)
[facility] request for Attending Physician Progress report. Form completed using info from last month, last visit 11/27/17 with me, recent updated MRI Cervical Spine and new referral to Dr Henre[facility]Moe Lim [facility]. Still TBD return to work 1-3 months, may need long term disability in future.  Form completed and to be faxed back today 12/15/17  Saralyn PilarAlexander Elyanna Wallick, DO [facility] 12/15/2017, 11:57 AM

## 2017-12-24 ENCOUNTER — Telehealth: Payer: Self-pay | Admitting: Family Medicine

## 2017-12-24 NOTE — Telephone Encounter (Addendum)
As per [facility] PT [phone]) patient has appointment tomorrow with Dr. Kirtland BouchardK so called to give update patient is continue having increase level of pain Left upper extremities, cervical radiculitis present and no sign or change in sensation along with dermatome, shoulder improvement is still stiff.

## 2017-12-24 NOTE — Telephone Encounter (Signed)
Will see patient and discuss future plan of treatment with regards to out of work disability. Ultimately she needs to [facility] to treat this underlying problem with regards to C-spine pain and radicular symptoms.  Saralyn PilarAlexander Karamalegos, DO [facility] [place] [facility] 12/24/2017, 5:10 PM

## 2017-12-25 ENCOUNTER — Ambulatory Visit (INDEPENDENT_AMBULATORY_CARE_PROVIDER_SITE_OTHER): Payer: Commercial Managed Care - PPO | Admitting: Family Medicine

## 2017-12-25 ENCOUNTER — Other Ambulatory Visit: Payer: Self-pay

## 2017-12-25 ENCOUNTER — Encounter: Payer: Self-pay | Admitting: Family Medicine

## 2017-12-25 VITALS — BP 117/65 | HR 82 | Temp 99.0°F | Ht <= 58 in | Wt 141.6 lb

## 2017-12-25 DIAGNOSIS — M4802 Spinal stenosis, cervical region: Secondary | ICD-10-CM

## 2017-12-25 DIAGNOSIS — M4722 Other spondylosis with radiculopathy, cervical region: Secondary | ICD-10-CM

## 2017-12-25 MED ORDER — PREDNISONE 10 MG PO TABS: ORAL_TABLET | ORAL | 0 refills | Status: DC

## 2017-12-25 NOTE — Progress Notes (Signed)
Subjective:    Patient ID: Cheryl Roberts, female    DOB: 16-Sep-1970, 47 y.o.   MRN: [id number]  Cheryl Roberts is a 47 y.o. female presenting on 12/25/2017 for Neck Pain (cervical DJD, radiculopathy) and Shoulder Pain (history of adhesive capsulitis)   HPI   Follow-up Chronic Neck Pain / Cervical DJD with Radiculopathy / Adhesive Capsulitis L shoulder - Last visit with me 11/27/17, for follow-up after ED and also for same problem, treated with continue with current treatments including C-spine MRI and [facility] PT, see prior notes for background information. - Interval update with results of MRI C-spine from 12/05/17 showed multilevel DJD and spinal stenosis likely causes of her radiculopathy from neck into L arm. She was referred to [facility]ine Specialist Dr Henreitta Leber - however apt has not been scheduled yet, and we have contacted their office today and they confirm received records and will schedule her. - Additionally recent update from [facility] PT yesterday 12/24/17 that patient is not progressing in PT as expected, still having significant pain and stiffness in Left shoulder she is limited with increased pain in L upper extremity, and stiffness with ROM. - Today patient reports here to follow-up on same problem, asking about status of ortho referral - Currently she would be unable to return to work given still limited range of motion in left arm, pain and episodes of shooting pain down arm with some weakness, weaker grip. Some tingling and numbness at times. - She is still on short term disability until July 30th - She will contact HR to determine next step Denies new injury, other joint pain, constant weakness numbness tingling, back pain   Depression screen [facility] 2/9 10/15/2017 09/15/2017 03/03/2017  Decreased Interest 0 0 0  Down, Depressed, Hopeless 0 0 1  PHQ - 2 Score 0 0 1  Difficult doing work/chores - - Not difficult at all    Social History   Tobacco Use  . Smoking status: Never Smoker  .  Smokeless tobacco: Never Used  Substance Use Topics  . Alcohol use: Yes    Comment: occas wine or beer  . Drug use: No    Review of Systems Per HPI unless specifically indicated above     Objective:    BP 117/65 (BP Location: Right Arm, Patient Position: Sitting, Cuff Size: Normal)   Pulse 82   Temp 99 F (37.2 C) (Oral)   Ht 4\' 9"  (1.448 m)   Wt 141 lb 9.6 oz (64.2 kg)   BMI 30.64 kg/m   Wt Readings from Last 3 Encounters:  12/25/17 141 lb 9.6 oz (64.2 kg)  11/27/17 140 lb 12.8 oz (63.9 kg)  11/22/17 138 lb (62.6 kg)    Physical Exam  Constitutional: She is oriented to person, place, and time. She appears well-developed and well-nourished. No distress.  Well-appearing, comfortable, cooperative  HENT:  Head: Normocephalic and atraumatic.  Mouth/Throat: Oropharynx is clear and moist.  Eyes: Conjunctivae are normal. Right eye exhibits no discharge. Left eye exhibits no discharge.  Neck:  Neck Inspection: Mostly symmetrical appearing without deformity Palpation: non tender over cervical paraspinal muscles with some mild spasm bilateral L>R ROM: slightly reduced range of motion with rotation to R Special Testing: Spurling's maneuver with some reproduced mild radicular symptoms into Left shoulder not into hand, negative on R side Strength: distal strength grip intact currently Neurovascular: distal sensation mildly reduced slightly finger tips left hand   Cardiovascular: Normal rate.  Pulmonary/Chest: Effort normal.  Musculoskeletal:  She exhibits no edema.  Left Shoulder Inspection: Normal appearance bilateral symmetrical Palpation: Non-tender to palpation over anterior, lateral, or posterior shoulder  ROM: Seems slightly worsened active ROM forward flexion not quite above shoulder level, still significantly limited internal rotation unable to go behind back, still limited abduction but marginally improve - limited by pain and stiffness Strength: Distally  intact Neurovascular: Distally intact pulses, sensation to light touch    Neurological: She is alert and oriented to person, place, and time.  Skin: Skin is warm and dry. No rash noted. She is not diaphoretic. No erythema.  Psychiatric: She has a normal mood and affect. Her behavior is normal.  Well groomed, good eye contact, normal speech and thoughts  Nursing note and vitals reviewed.    I have personally reviewed the radiology report from 12/05/17 C[facility]spine MRI at [facility].  Interface, Rad Results In - 12/05/2017 12:52 PM EDT EXAM: Magnetic resonance imaging, spinal canal and contents, cervical without contrast material. DATE: 12/04/2017 3:50 PM ACCESSION: [id number] UN DICTATED: 12/04/2017 4:02 PM INTERPRETATION LOCATION: Main Campus  CLINICAL INDICATION: 47 years old Female with PAIN CERVICAL SPINE  - CERVICALGIA  - G54.2 - Cervical nerve root compression - M54.2 - Neck pain.  COMPARISON: Cervical spine radiograph 06/30/2015.  TECHNIQUE: Multiplanar multisequence MRI was performed through the cervical spine without intravenous contrast.  FINDINGS:  Some of the sequences are degraded by patient motion artifact.  Straightening and slight reversal of the cervical lordosis. The signal intensity from the vertebral body bone marrow and spinal cord is normal. Apparent signal hyperintensity overlying the cord on the axial GRE sequences is probably artifactual.  Mild developmental stenosis of the spinal canal.  Multilevel degenerative disc disease, as characterized by intervertebral disc desiccation, loss of height, endplate irregularity and osteophytosis. Additional details as follows:  C2-C3: Small central disc protrusion contributing to mild spinal canal narrowing. No significant neuroforaminal narrowing.  C3-C4: Disc osteophyte complex effaces the ventral CSF space, and contributes to moderate spinal canal and bilateral neuroforaminal narrowing.  C4-C5: Disc osteophyte complex effaces the  ventral CSF space, and contributes to moderate spinal canal and severe bilateral neural foraminal narrowing.  C5-C6: Disc osteophyte complex asymmetric to the left paracentral zone contributes to moderate spinal canal and moderate to marked bilateral neuroforaminal narrowing. There is indentation of the left ventral cord which appears mildly flattened.  C6-C7: Disc osteophyte complex and uncovertebral hypertrophy contribute to mild left neural foraminal narrowing. No significant spinal canal or right neuroforaminal narrowing.  C7-T1: No significant disc bulge, spinal canal or neuroforaminal narrowing.  IMPRESSION:  -- Multilevel degenerative changes contribute to multilevel spinal canal narrowing most prominent at C3-C4 through C5-C6, compounded by developmental spinal canal stenosis. Varying degrees of neural foraminal stenosis worst at C4-5 and C5-6.  [name], DO [facility] 12/08/2017, 5:06 PM   Results for orders placed or performed during the hospital encounter of 11/22/17  CBC  Result Value Ref Range   WBC 9.1 3.6 - 11.0 K/uL   RBC 5.08 3.80 - 5.20 MIL/uL   Hemoglobin 13.6 12.0 - 16.0 g/dL   HCT 98.1 19.1 - 47.8 %   MCV 82.0 80.0 - 100.0 fL   MCH 26.7 26.0 - 34.0 pg   MCHC 32.6 32.0 - 36.0 g/dL   RDW 29.5 (H) 62.1 - 30.8 %   Platelets 293 150 - 440 K/uL  Comprehensive metabolic panel  Result Value Ref Range   Sodium 134 (L) 135 - 145 mmol/L  Potassium 3.4 (L) 3.5 - 5.1 mmol/L   Chloride 97 (L) 101 - 111 mmol/L   CO2 27 22 - 32 mmol/L   Glucose, Bld 152 (H) 65 - 99 mg/dL   BUN 22 (H) 6 - 20 mg/dL   Creatinine, Ser 1.610.69 0.44 - 1.00 mg/dL   Calcium 9.2 8.9 - 09.610.3 mg/dL   Total Protein 7.7 6.5 - 8.1 g/dL   Albumin 4.7 3.5 - 5.0 g/dL   AST 31 15 - 41 U/L   ALT 33 14 - 54 U/L   Alkaline Phosphatase 52 38 - 126 U/L   Total Bilirubin 0.6 0.3 - 1.2 mg/dL   GFR calc non Af Amer >60 >60 mL/min   GFR calc Af Amer >60 >60  mL/min   Anion gap 10 5 - 15  Troponin I  Result Value Ref Range   Troponin I <0.03 <0.03 ng/mL      Assessment & Plan:   Problem List Items Addressed This Visit    DJD (degenerative joint disease) of cervical spine - Primary   Relevant Medications   predniSONE (DELTASONE) 10 MG tablet   Spinal stenosis in cervical region      Persistent symptoms of Cervical Radiculopathy and L shoulder adhesive capsulitis limiting her function of Left upper extremity - Symptoms mostly from cervical spinal stenosis and radiculopathy, identified on MRI C-spine 12/05/17 - Limited improvement on medications - [facility] PT has not been as successful - still limited range of motion and pain/stiff  Plan Proceed to [facility] ortho Dr Henreitta LeberMoe Lim - previously referred, now made arrangements and contacted their office to confirm apt they should schedule with patient soon upon review - to determine next step for her C-spine - Will offer temporary Prednisone taper today for radicular symptoms - 6 day taper - Follow-up as planned within next 1-2 weeks prior to 7/30 deadline of short term disability to determine if need extension into long term disability   Meds ordered this encounter  Medications  . predniSONE (DELTASONE) 10 MG tablet    Sig: Take 6 tabs with breakfast Day 1, 5 tabs Day 2, 4 tabs Day 3, 3 tabs Day 4, 2 tabs Day 5, 1 tab Day 6.    Dispense:  21 tablet    Refill:  0    Follow up plan: Return in about 1 week (around 01/01/2018), or if symptoms worsen or fail to improve, for neck pain arthritis radiculopathy.  Saralyn PilarAlexander Karamalegos, DO [facility] [place] [facility] 12/26/2017, 12:17 AM

## 2017-12-25 NOTE — Patient Instructions (Addendum)
Thank you for coming to the office today.  We have confirmed that [facility] HAS your referral paperwork - stay tuned for them to review and call you next week with appointment.  If you do not hear from them by Tuesday next week - then you call them to chec  Nemiah CommanderMoe R. Lim, MD  [facility] Address: [address], [place], [place] [address]  Phone: [phone]  Start Prednisone taper over 6 days to help reduce pain and inflammation as another trial to see if this helps.  Check with your HR and short term disability - to determine what needs to be done next before potential transition to long term.  We may need to see you back with paperwork before July 30   Please schedule a Follow-up Appointment to: Return in about 1 week (around 01/01/2018), or if symptoms worsen or fail to improve, for neck pain arthritis radiculopathy.  If you have any other questions or concerns, please feel free to call the office or send a message through MyChart. You may also schedule an earlier appointment if necessary.  Additionally, you may be receiving a survey about your experience at our office within a few days to 1 week by e-mail or mail. We value your feedback.  Saralyn PilarAlexander Yoshio Seliga, DO [facility] [place] [name]

## 2018-01-05 ENCOUNTER — Encounter: Payer: Self-pay | Admitting: Family Medicine

## 2018-01-05 NOTE — Progress Notes (Signed)
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 Left shoulder/arm  Pertinent Test results: 1. Cervical Spine MRI - 12/04/17 - Multi-level DJD worst C3-4, C5-6. Developmental spinal stenosis, neural foraminal stenosis  Condition specific Medications/Dosage/Frequency - Previous treatments w/ NSAIDs (anti inflammatory) - Naproxen 500mg  BID, Meloxicam 15mg  daily, muscle relaxants Baclofen 10mg  TID, Gabapentin 300mg  TID, Prednisone dosepak taper PRN  Treatments: 1. Date your patient reported stopping work: 10/08/17 (anticipated date) 2. Date of Disability: approx 09/2017 3. Expected Return to Work Date: unknown TBD 4. Date you first treated this patient: 02/09/16 (for unrelated condition) 5. Date you first treated this patient for this condition: 01/17/17 6. Date of reported onset of this condition: 06/25/15 7. Date of most recent treatment 12/25/17  How often has patient been seen/treated for this condition - 1-2 visits every 2-3 months Date of next office visit: 01/27/18 ([facility] Dr Henreitta LeberMoe Lim)  Current Treatment Plan: [facility]erral [facility] - Dr Henreitta LeberMoe Lim for second opinion after MRI C-spine shows significant pathology and limited improvement on PT and injection therapy  Has surgery been performed? [x]  No Is surgery planned? [x]  No (maybe in future) Was patient hospitalized for this  condition? [x]  no  Has patient been referred to any other physician? [x]  Yes - Henreitta LeberMoe Lim, MD (Ph: [phone]) - Specialty: Spine Orthopedics [facility]  ---------------------- Restrictions / Limitations: - Based on office visit dated: 12/25/17  In an 8 hour period the patient is able to: - Continuously with standard breaks: [x]  Sit, Stand, Walk  Specific Activities: - Constantly 5.5 to 8 hours (normal function): [x]  Bend at waist, Balance, Fine manipulation, Kneel/Crouch  - Frequently 2.5 to 5.5 hours (partial restricton): [x]  Drive - see form  - Occasionally up to 2.5 hours (partial restriction): [x]  Climb, Gross manipulation - See form  - Never 0 hours (complete restriction): [x]  Reach (extend arms above shoulder), Reach (extend arms below shoulder) - Radiculopathy from neck to shoulder, frozen shoulder, pain limited range of motion and weakness  Expected duration of any restrictions/limitations: unknown / TBD Current status: [x]  Unchanged Additional comments - Defer expected duration / return to work estimate at this time since waiting on [facility], may need surgical intervention  Does patient have psychiatric / cognitive impairment: [x]  No Patient has capacity to endorse checks / direct use of proceeds: [x]  Yes  Completed, signed, and dated Long Term Disability paperwork 01/05/18. To be scanned into chart and RETURNED to patient.  Saralyn PilarAlexander Nadiya Pieratt, DO [facility] [place] [facility] 01/05/2018, 12:15 PM

## 2018-01-24 ENCOUNTER — Other Ambulatory Visit: Payer: Self-pay | Admitting: Family Medicine

## 2018-01-24 DIAGNOSIS — E119 Type 2 diabetes mellitus without complications: Secondary | ICD-10-CM

## 2018-01-24 DIAGNOSIS — I10 Essential (primary) hypertension: Secondary | ICD-10-CM

## 2018-01-29 ENCOUNTER — Telehealth: Payer: Self-pay | Admitting: Family Medicine

## 2018-01-29 ENCOUNTER — Other Ambulatory Visit: Payer: Self-pay | Admitting: Family Medicine

## 2018-01-29 NOTE — Telephone Encounter (Signed)
Pt called requesting refill on hydrochlorothiazide, [facility]

## 2018-01-29 NOTE — Telephone Encounter (Signed)
Rx send 01/24/2018 left message for patient to check with pharmacy.

## 2018-02-27 ENCOUNTER — Encounter: Payer: Self-pay | Admitting: Family Medicine

## 2018-02-27 ENCOUNTER — Ambulatory Visit (INDEPENDENT_AMBULATORY_CARE_PROVIDER_SITE_OTHER): Payer: Commercial Managed Care - PPO | Admitting: Family Medicine

## 2018-02-27 VITALS — BP 111/69 | HR 74 | Temp 98.5°F | Resp 16 | Ht <= 58 in | Wt 141.0 lb

## 2018-02-27 DIAGNOSIS — I10 Essential (primary) hypertension: Secondary | ICD-10-CM | POA: Diagnosis not present

## 2018-02-27 DIAGNOSIS — M4722 Other spondylosis with radiculopathy, cervical region: Secondary | ICD-10-CM

## 2018-02-27 DIAGNOSIS — E1169 Type 2 diabetes mellitus with other specified complication: Secondary | ICD-10-CM | POA: Diagnosis not present

## 2018-02-27 DIAGNOSIS — Z0000 Encounter for general adult medical examination without abnormal findings: Secondary | ICD-10-CM | POA: Diagnosis not present

## 2018-02-27 DIAGNOSIS — E785 Hyperlipidemia, unspecified: Secondary | ICD-10-CM

## 2018-02-27 DIAGNOSIS — E669 Obesity, unspecified: Secondary | ICD-10-CM

## 2018-02-27 DIAGNOSIS — M4802 Spinal stenosis, cervical region: Secondary | ICD-10-CM

## 2018-02-27 DIAGNOSIS — L853 Xerosis cutis: Secondary | ICD-10-CM

## 2018-02-27 MED ORDER — CYCLOBENZAPRINE HCL 10 MG PO TABS: 5.0000 mg | ORAL_TABLET | Freq: Three times a day (TID) | ORAL | 2 refills | Status: DC | PRN

## 2018-02-27 NOTE — Patient Instructions (Addendum)
Thank you for coming to the office today.  Try OTC Hydrocortisone cream, wash feet, as usual, use small layer of cream and rub in to clean feet. And then after 30 min may apply moisturizing cream.  If helping dry skin then can notify office we can send stronger rx to try. May need Podiatry if not improving.   After you get Flu Shot at work - send us a copy of a paper with date given on it. Or drop it off or they can fax it.  Your provider would like to you have your annual eye exam. Please contact your current eye doctor or here are some good options for you to contact.   [facility]   Address: [address], [place], [place] [address] Phone: [phone]  Website: [email or web address]   [facility] [address], [place], [place] [address] Phone: [phone] [email or web address]  DUE for FASTING BLOOD WORK (no food or drink after midnight before the lab appointment, only water or coffee without cream/sugar on the morning of)  SCHEDULE "Lab Only" visit in the morning at the clinic for lab draw in 1 WEEK  - Make sure Lab Only appointment is at about 1 week before your next appointment, so that results will be available  For Lab Results, once available within 2-3 days of blood draw, you can can log in to MyChart online to view your results and a brief explanation. Also, we can discuss results at next follow-up visit.   Please schedule a Follow-up Appointment to: Return in about 3 months (around 05/29/2018) for DM A1c / updates on Neck-Long Term Return?.  If you have any other questions or concerns, please feel free to call the office or send a message through MyChart. You may also schedule an earlier appointment if necessary.  Additionally, you may be receiving a survey about your experience at our office within a few days to 1 week by e-mail or mail. We value your feedback.  Saralyn PilarAlexander Lateefa Crosby, DO [facility] [place] [name]

## 2018-02-27 NOTE — Assessment & Plan Note (Signed)
Prior controlled A1c. Due now for repeat reading Complicated by hyperlipidemia. No hypoglycemia.  Plan: 1. Continue current therapy - Jardiance 25mg  daily, Metformin XR 750mg  daily - future consider double dose metformin if tolerated 3. Encourage improved lifestyle - low carb, low sugar diet, reduce portion size, continue improving regular exercise with increased walking 3. Check CBG still, bring log to next visit for review 4. Continue ASA, Statin 5. Will need DM [facility] - to schedule - DM Foot exam done 6. Follow-up 3 months DM A1c

## 2018-02-27 NOTE — Assessment & Plan Note (Signed)
[facility] Currently on Long Term disability Continues with targeted PT Future injection vs surgery

## 2018-02-27 NOTE — Assessment & Plan Note (Signed)
Prior Lipids mostly controlled Due for repeat fasting lipid panel, will return next week Continue statin, Atorvastatin 40mg  daily Follow-up

## 2018-02-27 NOTE — Assessment & Plan Note (Signed)
Weight stable over past 3 months Improving diet and exercise

## 2018-02-27 NOTE — Assessment & Plan Note (Signed)
Well-controlled HTN - Home BP readings improved  No known complications  Plan:  1. Continue current BP regimen - HCTZ 25mg  daily, Lisinopril 10mg  daily 2. Encourage improved lifestyle - low sodium diet, regular exercise 3. Continue monitor BP outside office, bring readings to next visit, if persistently >140/90 or new symptoms notify office sooner

## 2018-02-27 NOTE — Progress Notes (Signed)
Subjective:    Patient ID: Cheryl Roberts, female    DOB: 22-May-1971, 47 y.o.   MRN: [id number]  Cheryl Roberts is a 47 y.o. female presenting on 02/27/2018 for Annual Exam   HPI  Here for Annual Physical - due for labs, will return in near future. She is required to have Annual Physical from 03/10/17 to 03/09/18, and her last Annual was in September 2018 not in that window.  CHRONIC DM, Type 2 No new concerns. Due for A1c. Will return CBGs: Mostly unchanged - Avg 130-150s, low >100, high < 200 - checks x 2 daily Meds: Metformin XR 763m daily, Jardiance 1105mdaily Currently on Aspirin, Atorvastatin Lifestyle: - weight unchanged, stable in 3 months - Diet: Continues increased vegetables, greens, still lower carb diet - Exercise: Regular walking few times weekly  HYPERLIPIDEMIA / Obesity BMI >30 - Reports no concerns. Last lipid panel 03/2017, mostly controlled  - Currently taking Atorvastatin 4035mtolerating well without side effects or myalgias - Taking ASA 81  CHRONIC HTN: Reports no concerns. Home BP readings normal. Current Meds - HCTZ 85m43mily, Lisinopril 10mg48mly Reports good compliance, took meds today. Tolerating well, w/o complaints.  Plantar, dry skin dermatitis Chronic problem, tried several moisturizers limited results. No ulceration. Not tried anti fungal or other cream.  Additional history -  [facility] Dr Moe LCarolynn Sayerse has pursued PT on her neck now, and considering injections and possible surgery in future if not improved. Still pain intermittent left arm and hand, good days bad days, range of motion slightly improved but still significantly limited by pain / stiffness - Refilled Flexeril PRN today., She is off Gabapentin, Prednisone taper and Baclofen. - Next apt 10/22 - Remains on Long Term Disability into 2020  Health Maintenance:  She will get Flu Shot at work and send is copy of admin report.  Depression  screen [facility] 10/15/2017 09/15/2017  Decreased Interest 0 0 0  Down, Depressed, Hopeless 0 0 0  PHQ - 2 Score 0 0 0  Difficult doing work/chores - - -    Past Medical History:  Diagnosis Date  . Asthma   . Cervical radiculopathy   . Chronic sinusitis   . Hypertension    History reviewed. No pertinent surgical history. Social History   Socioeconomic History  . Marital status: Single    Spouse name: Not on file  . Number of children: Not on file  . Years of education: Not on file  . Highest education level: Not on file  Occupational History  . Not on file  Social Needs  . Financial resource strain: Not on file  . Food insecurity:    Worry: Not on file    Inability: Not on file  . Transportation needs:    Medical: Not on file    Non-medical: Not on file  Tobacco Use  . Smoking status: Never Smoker  . Smokeless tobacco: Never Used  Substance and Sexual Activity  . Alcohol use: Yes    Comment: occas wine or beer  . Drug use: No  . Sexual activity: Never  Lifestyle  . Physical activity:    Days per week: Not on file    Minutes per session: Not on file  . Stress: Not on file  Relationships  . Social connections:    Talks on phone: Not on file    Gets together: Not on file    Attends religious  service: Not on file    Active member of club or organization: Not on file    Attends meetings of clubs or organizations: Not on file    Relationship status: Not on file  . Intimate partner violence:    Fear of current or ex partner: Not on file    Emotionally abused: Not on file    Physically abused: Not on file    Forced sexual activity: Not on file  Other Topics Concern  . Not on file  Social History Narrative  . Not on file   Family History  Problem Relation Age of Onset  . Hypertension Mother   . Hyperthyroidism Mother   . Gout Mother   . Diabetes Mother   . Arthritis Mother   . Cancer Father    Current Outpatient Medications on File Prior to Visit    Medication Sig  . albuterol (PROVENTIL HFA;VENTOLIN HFA) 108 (90 Base) MCG/ACT inhaler Inhale 2 puffs into the lungs every 2 (two) hours as needed for wheezing or shortness of breath (cough).  Marland Kitchen aspirin 81 MG tablet Take 1 tablet (81 mg total) by mouth daily.  Marland Kitchen atorvastatin (LIPITOR) 40 MG tablet Take 1 tablet (40 mg total) by mouth daily.  . Blood Glucose Monitoring Suppl (TRUE METRIX METER) w/Device KIT 1 each by Does not apply route as directed.  . fluticasone (FLONASE) 50 MCG/ACT nasal spray Place 1 spray into both nostrils daily. For 2 to 4 weeks then as needed.  Marland Kitchen glucose blood (TRUE METRIX BLOOD GLUCOSE TEST) test strip Check blood sugar twice daily. Dx E11.9  . hydrochlorothiazide (HYDRODIURIL) 25 MG tablet TAKE ONE TABLET BY MOUTH EVERY DAY  . JARDIANCE 10 MG TABS tablet Take 10 mg by mouth daily.  Marland Kitchen LANCETS ULTRA FINE MISC 1 each by Does not apply route 2 (two) times daily. Check blood sugar twice daily. Dx: E11.9  . lisinopril (PRINIVIL,ZESTRIL) 10 MG tablet TAKE ONE TABLET BY MOUTH EVERY DAY  . loratadine (CLARITIN) 10 MG tablet Take 1 tablet (10 mg total) by mouth daily.  . meloxicam (MOBIC) 15 MG tablet Take 1 tablet (15 mg total) by mouth daily.  . metFORMIN (GLUCOPHAGE-XR) 750 MG 24 hr tablet TAKE ONE TABLET BY MOUTH EVERY DAY WITH BREAKFAST   No current facility-administered medications on file prior to visit.     Review of Systems  Constitutional: Negative for activity change, appetite change, chills, diaphoresis, fatigue and fever.  HENT: Negative for congestion and hearing loss.   Eyes: Negative for visual disturbance.  Respiratory: Negative for apnea, cough, chest tightness, shortness of breath and wheezing.   Cardiovascular: Negative for chest pain, palpitations and leg swelling.  Gastrointestinal: Negative for abdominal pain, blood in stool, constipation, diarrhea, nausea and vomiting.  Endocrine: Negative for cold intolerance, polyphagia and polyuria.   Genitourinary: Negative for difficulty urinating, dyspareunia, dysuria, frequency and hematuria.  Musculoskeletal: Positive for arthralgias (neck / shoulder). Negative for back pain and neck pain.  Skin: Negative for rash.  Allergic/Immunologic: Negative for environmental allergies.  Neurological: Negative for dizziness, weakness, light-headedness, numbness and headaches.  Hematological: Negative for adenopathy.  Psychiatric/Behavioral: Negative for behavioral problems, dysphoric mood and sleep disturbance.   Per HPI unless specifically indicated above     Objective:    BP 111/69   Pulse 74   Temp 98.5 F (36.9 C) (Oral)   Resp 16   Ht '4\' 9"'  (1.448 m)   Wt 141 lb (64 kg)   BMI 30.51 kg/m  Wt Readings from Last 3 Encounters:  02/27/18 141 lb (64 kg)  12/25/17 141 lb 9.6 oz (64.2 kg)  11/27/17 140 lb 12.8 oz (63.9 kg)    Physical Exam  Constitutional: She is oriented to person, place, and time. She appears well-developed and well-nourished. No distress.  Well-appearing, comfortable, cooperative  HENT:  Head: Normocephalic and atraumatic.  Mouth/Throat: Oropharynx is clear and moist.  Eyes: Pupils are equal, round, and reactive to light. Conjunctivae and EOM are normal. Right eye exhibits no discharge. Left eye exhibits no discharge.  Neck: Normal range of motion. Neck supple. No thyromegaly present.  Cardiovascular: Normal rate, regular rhythm, normal heart sounds and intact distal pulses.  No murmur heard. Pulmonary/Chest: Effort normal and breath sounds normal. No respiratory distress. She has no wheezes. She has no rales.  Abdominal: Soft. Bowel sounds are normal. She exhibits no distension and no mass. There is no tenderness.  Musculoskeletal: She exhibits no edema.  Upper / Lower Extremities: - Normal muscle tone, strength bilateral upper extremities 5/5, lower extremities 5/5  Neck Inspection: Mostly symmetrical appearing without deformity Palpation: non tender  over cervical paraspinal muscles with some mild spasm bilateral L>R still ROM: slightly reduced range of motion with rotation to R Strength: distal strength grip intact currently Neurovascular: distal sensation mildly reduced slightly finger tips left hand   Left Shoulder Inspection: Normal appearance bilateral symmetrical Palpation: Non-tender to palpation over anterior, lateral, or posterior shoulder  ROM: Mild improved but still significant reducedactive ROM forward flexion, somewhat improved but still significantly limited internal rotation, still limited abduction but marginally improve - limited by pain and stiffness Strength: Distally intact Neurovascular: Distally intact pulses, sensation to light touch     Lymphadenopathy:    She has no cervical adenopathy.  Neurological: She is alert and oriented to person, place, and time.  Distal sensation intact to light touch all extremities  Skin: Skin is warm and dry. No rash noted. She is not diaphoretic. No erythema.  Psychiatric: She has a normal mood and affect. Her behavior is normal.  Well groomed, good eye contact, normal speech and thoughts  Nursing note and vitals reviewed.    Diabetic Foot Exam - Simple   Simple Foot Form Diabetic Foot exam was performed with the following findings:  Yes 02/27/2018  2:37 PM  Visual Inspection See comments:  Yes Sensation Testing Intact to touch and monofilament testing bilaterally:  Yes Pulse Check Posterior Tibialis and Dorsalis pulse intact bilaterally:  Yes Comments Generalized plantar aspect bilateral dry peeling skin dermatitis without ulceration.     Results for orders placed or performed during the hospital encounter of 11/22/17  CBC  Result Value Ref Range   WBC 9.1 3.6 - 11.0 K/uL   RBC 5.08 3.80 - 5.20 MIL/uL   Hemoglobin 13.6 12.0 - 16.0 g/dL   HCT 41.7 35.0 - 47.0 %   MCV 82.0 80.0 - 100.0 fL   MCH 26.7 26.0 - 34.0 pg   MCHC 32.6 32.0 - 36.0 g/dL   RDW 14.9 (H) 11.5 -  14.5 %   Platelets 293 150 - 440 K/uL  Comprehensive metabolic panel  Result Value Ref Range   Sodium 134 (L) 135 - 145 mmol/L   Potassium 3.4 (L) 3.5 - 5.1 mmol/L   Chloride 97 (L) 101 - 111 mmol/L   CO2 27 22 - 32 mmol/L   Glucose, Bld 152 (H) 65 - 99 mg/dL   BUN 22 (H) 6 - 20 mg/dL   Creatinine,  Ser 0.69 0.44 - 1.00 mg/dL   Calcium 9.2 8.9 - 10.3 mg/dL   Total Protein 7.7 6.5 - 8.1 g/dL   Albumin 4.7 3.5 - 5.0 g/dL   AST 31 15 - 41 U/L   ALT 33 14 - 54 U/L   Alkaline Phosphatase 52 38 - 126 U/L   Total Bilirubin 0.6 0.3 - 1.2 mg/dL   GFR calc non Af Amer >60 >60 mL/min   GFR calc Af Amer >60 >60 mL/min   Anion gap 10 5 - 15  Troponin I  Result Value Ref Range   Troponin I <0.03 <0.03 ng/mL      Assessment & Plan:   Problem List Items Addressed This Visit    DJD (degenerative joint disease) of cervical spine    Chronic underlying problem with C spine DJD See A&P      Relevant Medications   cyclobenzaprine (FLEXERIL) 10 MG tablet   Hyperlipidemia associated with type 2 diabetes mellitus (HCC)    Prior Lipids mostly controlled Due for repeat fasting lipid panel, will return next week Continue statin, Atorvastatin 39m daily Follow-up      Hypertension    Well-controlled HTN - Home BP readings improved  No known complications  Plan:  1. Continue current BP regimen - HCTZ 258mdaily, Lisinopril 1055maily 2. Encourage improved lifestyle - low sodium diet, regular exercise 3. Continue monitor BP outside office, bring readings to next visit, if persistently >140/90 or new symptoms notify office sooner      Obesity (BMI 30.0-34.9)    Weight stable over past 3 months Improving diet and exercise      Spinal stenosis in cervical region    [facility] Currently on [facility] Continues with targeted PT Future injection vs surgery      Type 2 diabetes mellitus with other specified complication ([place]  Prior controlled A1c. Due now for  repeat reading Complicated by hyperlipidemia. No hypoglycemia.  Plan: 1. Continue current therapy - Jardiance 46m64mily, Metformin XR 750mg72mly - future consider double dose metformin if tolerated 3. Encourage improved lifestyle - low carb, low sugar diet, reduce portion size, continue improving regular exercise with increased walking 3. Check CBG still, bring log to next visit for review 4. Continue ASA, Statin 5. [facility] - DM Foot exam done 6. Follow-up 3 months DM A1c       Other Visit Diagnoses    Annual physical exam    -  Primary  Updated Health Maintenance information - Flu shot at work, send record Reviewed recent lab results with patient Encouraged improvement to lifestyle with diet and exercise - Goal of weight loss  Completed form signed today for physical complete.     Dry skin dermatitis     Bilateral feet plantar, uncomplicated Trial of topical OTC steroid cream, and moisturizers Follow-up PRN - may inc dose of rx steroid if benefit or refer to Podiatry      Meds ordered this encounter  Medications  . cyclobenzaprine (FLEXERIL) 10 MG tablet    Sig: Take 0.5-1 tablets (5-10 mg total) by mouth 3 (three) times daily as needed for muscle spasms.    Dispense:  30 tablet    Refill:  2    Follow up plan: Return in about 3 months (around 05/29/2018) for DM A1c / updates on Neck-Long Term Return?.  Future orders are already  in system from 10/2017 that are anticipated in 03/2018. Her prior physical was scheduled for October 2019 but now due to work physical date requirement, this was changed to today.  Nobie Putnam, [facility] 02/27/2018, 2:38 PM

## 2018-02-27 NOTE — Assessment & Plan Note (Addendum)
Chronic underlying problem with C spine DJD See A&P

## 2018-03-02 ENCOUNTER — Other Ambulatory Visit: Payer: Commercial Managed Care - PPO

## 2018-03-02 DIAGNOSIS — I10 Essential (primary) hypertension: Secondary | ICD-10-CM

## 2018-03-02 DIAGNOSIS — R7989 Other specified abnormal findings of blood chemistry: Secondary | ICD-10-CM

## 2018-03-02 DIAGNOSIS — Z0000 Encounter for general adult medical examination without abnormal findings: Secondary | ICD-10-CM

## 2018-03-02 DIAGNOSIS — E1169 Type 2 diabetes mellitus with other specified complication: Secondary | ICD-10-CM

## 2018-03-02 DIAGNOSIS — R718 Other abnormality of red blood cells: Secondary | ICD-10-CM

## 2018-03-02 DIAGNOSIS — E785 Hyperlipidemia, unspecified: Secondary | ICD-10-CM

## 2018-03-03 LAB — T4, FREE: Free T4: 1.2 ng/dL (ref 0.8–1.8)

## 2018-03-03 LAB — CBC WITH DIFFERENTIAL/PLATELET
Basophils Absolute: 22 {cells}/uL (ref 0–200)
Basophils Relative: 0.3 %
Eosinophils Absolute: 58 {cells}/uL (ref 15–500)
Eosinophils Relative: 0.8 %
HCT: 43.4 % (ref 35.0–45.0)
Hemoglobin: 13.7 g/dL (ref 11.7–15.5)
Lymphs Abs: 3161 {cells}/uL (ref 850–3900)
MCH: 24.9 pg — ABNORMAL LOW (ref 27.0–33.0)
MCHC: 31.6 g/dL — ABNORMAL LOW (ref 32.0–36.0)
MCV: 78.8 fL — ABNORMAL LOW (ref 80.0–100.0)
MPV: 10.4 fL (ref 7.5–12.5)
Monocytes Relative: 6.8 %
Neutro Abs: 3562 {cells}/uL (ref 1500–7800)
Neutrophils Relative %: 48.8 %
Platelets: 282 10*3/uL (ref 140–400)
RBC: 5.51 Million/uL — ABNORMAL HIGH (ref 3.80–5.10)
RDW: 14.1 % (ref 11.0–15.0)
Total Lymphocyte: 43.3 %
WBC mixed population: 496 {cells}/uL (ref 200–950)
WBC: 7.3 10*3/uL (ref 3.8–10.8)

## 2018-03-03 LAB — COMPLETE METABOLIC PANEL WITHOUT GFR
AG Ratio: 1.9 (calc) (ref 1.0–2.5)
ALT: 34 U/L — ABNORMAL HIGH (ref 6–29)
AST: 32 U/L (ref 10–35)
Albumin: 4.6 g/dL (ref 3.6–5.1)
Alkaline phosphatase (APISO): 50 U/L (ref 33–115)
BUN: 15 mg/dL (ref 7–25)
CO2: 29 mmol/L (ref 20–32)
Calcium: 10.5 mg/dL — ABNORMAL HIGH (ref 8.6–10.2)
Chloride: 100 mmol/L (ref 98–110)
Creat: 0.74 mg/dL (ref 0.50–1.10)
GFR, Est African American: 113 mL/min/{1.73_m2}
GFR, Est Non African American: 97 mL/min/{1.73_m2}
Globulin: 2.4 g/dL (ref 1.9–3.7)
Glucose, Bld: 122 mg/dL — ABNORMAL HIGH (ref 65–99)
Potassium: 4 mmol/L (ref 3.5–5.3)
Sodium: 141 mmol/L (ref 135–146)
Total Bilirubin: 0.3 mg/dL (ref 0.2–1.2)
Total Protein: 7 g/dL (ref 6.1–8.1)

## 2018-03-03 LAB — LIPID PANEL
CHOLESTEROL: 182 mg/dL (ref <200)
HDL: 37 mg/dL — AB (ref >50)
LDL CHOLESTEROL (CALC): 125 mg/dL — AB
Non-HDL Cholesterol (Calc): 145 mg/dL (calc) — ABNORMAL HIGH (ref <130)
TRIGLYCERIDES: 96 mg/dL (ref <150)
Total CHOL/HDL Ratio: 4.9 (calc) (ref <5.0)

## 2018-03-03 LAB — HEMOGLOBIN A1C
Hgb A1c MFr Bld: 8.1 %{Hb} — ABNORMAL HIGH
Mean Plasma Glucose: 186 (calc)
eAG (mmol/L): 10.3 (calc)

## 2018-03-03 LAB — TSH: TSH: 2.68 m[IU]/L

## 2018-03-20 ENCOUNTER — Other Ambulatory Visit: Payer: Commercial Managed Care - PPO

## 2018-03-25 ENCOUNTER — Encounter: Payer: Commercial Managed Care - PPO | Admitting: Family Medicine

## 2018-04-02 ENCOUNTER — Encounter: Payer: Self-pay | Admitting: Family Medicine

## 2018-04-02 NOTE — Progress Notes (Signed)
Received form from [facility] - Dr Henreitta Leber regarding patient's proposed future surgery for Anterior cervical decompression/fusion, scheduling is TBD.  They will perform labs, CXR, EKG at [facility] as part of their pre-op procedure.  I have signed that she is medically optimal for surgery, no further testing from me is required prior to her proceeding with surgery.  They may hold Aspirin 81 for 7-10 days prior.  Faxed last office visit note to them as well with form.  Her next visit with me is in December 2019, to follow-up diabetes control.  [name], DO [facility] 04/02/2018, 2:36 PM

## 2018-04-16 ENCOUNTER — Other Ambulatory Visit: Payer: Self-pay | Admitting: Family Medicine

## 2018-04-16 DIAGNOSIS — E1169 Type 2 diabetes mellitus with other specified complication: Secondary | ICD-10-CM

## 2018-05-27 ENCOUNTER — Emergency Department
Admission: EM | Admit: 2018-05-27 | Discharge: 2018-05-27 | Disposition: A | Discharge status: Home / Self Care | Payer: Commercial Managed Care - PPO | Attending: Emergency Medicine | Admitting: Emergency Medicine

## 2018-05-27 ENCOUNTER — Encounter: Payer: Self-pay | Admitting: Emergency Medicine

## 2018-05-27 ENCOUNTER — Other Ambulatory Visit: Payer: Self-pay

## 2018-05-27 DIAGNOSIS — Z7982 Long term (current) use of aspirin: Secondary | ICD-10-CM | POA: Insufficient documentation

## 2018-05-27 DIAGNOSIS — Z79899 Other long term (current) drug therapy: Secondary | ICD-10-CM | POA: Insufficient documentation

## 2018-05-27 DIAGNOSIS — R51 Headache: Secondary | ICD-10-CM | POA: Insufficient documentation

## 2018-05-27 DIAGNOSIS — R05 Cough: Secondary | ICD-10-CM | POA: Insufficient documentation

## 2018-05-27 DIAGNOSIS — Z7984 Long term (current) use of oral hypoglycemic drugs: Secondary | ICD-10-CM | POA: Insufficient documentation

## 2018-05-27 DIAGNOSIS — J101 Influenza due to other identified influenza virus with other respiratory manifestations: Secondary | ICD-10-CM | POA: Insufficient documentation

## 2018-05-27 DIAGNOSIS — E119 Type 2 diabetes mellitus without complications: Secondary | ICD-10-CM | POA: Insufficient documentation

## 2018-05-27 DIAGNOSIS — I10 Essential (primary) hypertension: Secondary | ICD-10-CM | POA: Insufficient documentation

## 2018-05-27 LAB — INFLUENZA PANEL BY PCR (TYPE A & B)
INFLAPCR: POSITIVE — AB
INFLBPCR: NEGATIVE

## 2018-05-27 MED ORDER — PSEUDOEPH-BROMPHEN-DM 30-2-10 MG/5ML PO SYRP: 5.0000 mL | ORAL_SOLUTION | Freq: Four times a day (QID) | ORAL | 0 refills | Status: DC | PRN

## 2018-05-27 MED ORDER — ACETAMINOPHEN 500 MG PO TABS
1000.0000 mg | ORAL_TABLET | Freq: Once | ORAL | Status: AC
  Administered 2018-05-27: 1000 mg via ORAL
  Filled 2018-05-27: qty 2

## 2018-05-27 MED ORDER — OSELTAMIVIR PHOSPHATE 75 MG PO CAPS: 75.0000 mg | ORAL_CAPSULE | Freq: Two times a day (BID) | ORAL | 0 refills | Status: DC

## 2018-05-27 MED ORDER — BENZONATATE 100 MG PO CAPS
200.0000 mg | ORAL_CAPSULE | Freq: Once | ORAL | Status: AC
  Administered 2018-05-27: 200 mg via ORAL
  Filled 2018-05-27: qty 2

## 2018-05-27 MED ORDER — IBUPROFEN 600 MG PO TABS: 600.0000 mg | ORAL_TABLET | Freq: Three times a day (TID) | ORAL | 0 refills | Status: DC | PRN

## 2018-05-27 NOTE — ED Notes (Addendum)
First Nurse Note: Patient complaining of flu like symptoms - body aches, coughing - given adult face mask to wear.  Alert and oriented, speaking in full sentences.

## 2018-05-27 NOTE — ED Triage Notes (Signed)
Took tylenol last night for fever, none this am.

## 2018-05-27 NOTE — ED Provider Notes (Signed)
[facility] Emergency Department Provider Note   ____________________________________________   First MD Initiated Contact with Patient 05/27/18 0818     (approximate)  I have reviewed the triage vital signs and the nursing notes.   HISTORY  Chief Complaint Cough and Fever    HPI Cheryl Roberts is a 47 y.o. female patient presents with fever, body aches, cough, and headache which started yesterday.  Patient denies nausea, vomiting, diarrhea.  No palliative measure for complaint.  Patient has not taken his flu shot for this season.  Past Medical History:  Diagnosis Date  . Asthma   . Cervical radiculopathy   . Chronic sinusitis   . Hypertension     Patient Active Problem List   Diagnosis Date Noted  . Adhesive capsulitis of left shoulder 10/15/2017  . Plantar fasciitis, right 10/15/2017  . Chronic pain of left upper extremity 09/19/2017  . Arthralgia of right temporomandibular joint 06/11/2017  . Pigmented skin lesion 06/11/2017  . 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 Spinal stenosis in cervical region 01/17/2017  . Chronic low back pain without sciatica 06/22/2016  . Strain of left trapezius muscle 06/22/2016  . Allergic rhinitis due to allergen 03/26/2016  . Hyperlipidemia associated with type 2 diabetes mellitus ([place]) 03/15/2016  . Hypertension 02/09/2016  . Type 2 diabetes mellitus with other specified complication ([place]) 66/59/9357  . DJD (degenerative joint disease) of cervical spine 02/09/2016  . Obesity (BMI 30.0-34.9) 02/09/2016  . Asthma, mild intermittent 02/09/2016  . Sialoadenitis 02/28/2003    History reviewed. No pertinent surgical history.  Prior to Admission medications   Medication Sig Start Date End Date Taking? Authorizing Provider  albuterol (PROVENTIL HFA;VENTOLIN HFA) 108 (90 Base) MCG/ACT inhaler Inhale 2 puffs into the lungs every 2 (two) hours as needed for wheezing or shortness of breath  (cough). 02/09/16   Karamalegos, Devonne Doughty, DO  aspirin 81 MG tablet Take 1 tablet (81 mg total) by mouth daily. 03/15/16   Karamalegos, Devonne Doughty, DO  atorvastatin (LIPITOR) 40 MG tablet Take 1 tablet (40 mg total) by mouth daily. 03/17/17   Karamalegos, Devonne Doughty, DO  Blood Glucose Monitoring Suppl (TRUE METRIX METER) w/Device KIT 1 each by Does not apply route as directed. 05/29/16   Karamalegos, Devonne Doughty, DO  brompheniramine-pseudoephedrine-DM 30-2-10 MG/5ML syrup Take 5 mLs by mouth 4 (four) times daily as needed. 05/27/18   Sable Feil, PA-C  cyclobenzaprine (FLEXERIL) 10 MG tablet Take 0.5-1 tablets (5-10 mg total) by mouth 3 (three) times daily as needed for muscle spasms. 02/27/18   Karamalegos, Devonne Doughty, DO  fluticasone (FLONASE) 50 MCG/ACT nasal spray Place 1 spray into both nostrils daily. For 2 to 4 weeks then as needed. 03/26/16   Karamalegos, Devonne Doughty, DO  glucose blood (TRUE METRIX BLOOD GLUCOSE TEST) test strip Check blood sugar twice daily. Dx E11.9 05/29/16   Olin Hauser, DO  hydrochlorothiazide (HYDRODIURIL) 25 MG tablet TAKE ONE TABLET BY MOUTH EVERY DAY 01/24/18   Karamalegos, Devonne Doughty, DO  ibuprofen (ADVIL,MOTRIN) 600 MG tablet Take 1 tablet (600 mg total) by mouth every 8 (eight) hours as needed. 05/27/18   Sable Feil, PA-C  JARDIANCE 25 MG TABS tablet TAKE ONE TABLET BY MOUTH EVERY DAY 04/16/18   Parks Ranger, Devonne Doughty, DO  LANCETS ULTRA FINE MISC 1 each by Does not apply route 2 (two) times daily. Check blood sugar twice daily. Dx: E11.9 05/29/16   Olin Hauser, DO  lisinopril (PRINIVIL,ZESTRIL) 10 MG tablet TAKE ONE TABLET BY MOUTH EVERY DAY 01/24/18   Karamalegos, Devonne Doughty, DO  loratadine (CLARITIN) 10 MG tablet Take 1 tablet (10 mg total) by mouth daily. 03/26/16   Karamalegos, Devonne Doughty, DO  meloxicam (MOBIC) 15 MG tablet Take 1 tablet (15 mg total) by mouth daily. 04/08/17   Karamalegos, Devonne Doughty, DO  metFORMIN  (GLUCOPHAGE-XR) 750 MG 24 hr tablet TAKE ONE TABLET BY MOUTH EVERY DAY WITH BREAKFAST 09/10/17   Karamalegos, Devonne Doughty, DO  oseltamivir (TAMIFLU) 75 MG capsule Take 1 capsule (75 mg total) by mouth 2 (two) times daily for 5 days. 05/27/18 06/01/18  Sable Feil, PA-C    Allergies Amoxicillin  Family History  Problem Relation Age of Onset  . Hypertension Mother   . Hyperthyroidism Mother   . Gout Mother   . Diabetes Mother   . Arthritis Mother   . Cancer Father     Social History Social History   Tobacco Use  . Smoking status: Never Smoker  . Smokeless tobacco: Never Used  Substance Use Topics  . Alcohol use: Yes    Comment: occas wine or beer  . Drug use: No    Review of Systems Constitutional: No fever/chills.  Body aches Eyes: No visual changes. ENT: No sore throat. Cardiovascular: Denies chest pain. Respiratory: Denies shortness of breath.  Nonproductive cough Gastrointestinal: No abdominal pain.  No nausea, no vomiting.  No diarrhea.  No constipation. Genitourinary: Negative for dysuria. Musculoskeletal: Negative for back pain. Skin: Negative for rash. Neurological: Negative for headaches, focal weakness or numbness. Endocrine:Diabetes and hypertension Allergic/Immunilogical: Amoxicillin ____________________________________________   PHYSICAL EXAM:  VITAL SIGNS: ED Triage Vitals  Enc Vitals Group     BP 05/27/18 0810 120/67     Pulse Rate 05/27/18 0810 100     Resp 05/27/18 0810 18     Temp 05/27/18 0810 (!) 102.4 F (39.1 C)     Temp Source 05/27/18 0810 Oral     SpO2 05/27/18 0810 99 %     Weight 05/27/18 0811 140 lb (63.5 kg)     Height 05/27/18 0811 '4\' 9"'  (1.448 m)     Head Circumference --      Peak Flow --      Pain Score 05/27/18 0810 0     Pain Loc --      Pain Edu? --      Excl. in [place]? --     Constitutional: Alert and oriented. Well appearing and in no acute distress.  Febrile Nose: Nasal congestion runny nose. Mouth/Throat:  Mucous membranes are moist.  Oropharynx non-erythematous. Neck: No stridor. Hematological/Lymphatic/Immunilogical: No cervical lymphadenopathy. Cardiovascular: Normal rate, regular rhythm. Grossly normal heart sounds.  Good peripheral circulation. Respiratory: Normal respiratory effort.  No retractions. Lungs CTAB. Gastrointestinal: Soft and nontender. No distention. No abdominal bruits. No CVA tenderness. Neurologic:  Normal speech and language. No gross focal neurologic deficits are appreciated. No gait instability. Skin:  Skin is warm, dry and intact. No rash noted. Psychiatric: Mood and affect are normal. Speech and behavior are normal.  ____________________________________________   LABS (all labs ordered are listed, but only abnormal results are displayed)  Labs Reviewed  INFLUENZA PANEL BY PCR (TYPE A & B) - Abnormal; Notable for the following components:      Result Value   Influenza A By PCR POSITIVE (*)    All other components within normal limits   ____________________________________________  EKG   ____________________________________________  RADIOLOGY  ED  MD interpretation:    Official radiology report(s): No results found.  ____________________________________________   PROCEDURES  Procedure(s) performed: None  Procedures  Critical Care performed: No  ____________________________________________   INITIAL IMPRESSION / ASSESSMENT AND PLAN / ED COURSE  As part of my medical decision making, I reviewed the following data within the [place]    Patient presented with acute onset last night of body aches, cough, fever.  Patient was positive influenza a.  Patient given discharge care instruction advised take medication as directed.  Patient will follow-up PCP.      ____________________________________________   FINAL CLINICAL IMPRESSION(S) / ED DIAGNOSES  Final diagnoses:  Influenza A     ED Discharge Orders          Ordered    oseltamivir (TAMIFLU) 75 MG capsule  2 times daily     05/27/18 0913    brompheniramine-pseudoephedrine-DM 30-2-10 MG/5ML syrup  4 times daily PRN     05/27/18 0913    ibuprofen (ADVIL,MOTRIN) 600 MG tablet  Every 8 hours PRN     05/27/18 0913           Note:  This document was prepared using Dragon voice recognition software and may include unintentional dictation errors.    Sable Feil, PA-C 05/27/18 0915    Drenda Freeze, MD 05/27/18 [phone]

## 2018-05-27 NOTE — ED Notes (Signed)
See triage note  States she developed fever and body aches   States sx's started yesterday  Non prod cough  Febrile on arrival

## 2018-05-27 NOTE — ED Triage Notes (Signed)
Pt here with body aches, cough and fever since yesterday, appears in no distress at this time. Talking in full sentences in triage.

## 2018-05-29 ENCOUNTER — Ambulatory Visit: Payer: Commercial Managed Care - PPO | Admitting: Family Medicine

## 2018-05-29 ENCOUNTER — Emergency Department: Payer: Self-pay

## 2018-05-29 DIAGNOSIS — Z79899 Other long term (current) drug therapy: Secondary | ICD-10-CM | POA: Insufficient documentation

## 2018-05-29 DIAGNOSIS — Z7984 Long term (current) use of oral hypoglycemic drugs: Secondary | ICD-10-CM | POA: Insufficient documentation

## 2018-05-29 DIAGNOSIS — Z7982 Long term (current) use of aspirin: Secondary | ICD-10-CM | POA: Insufficient documentation

## 2018-05-29 DIAGNOSIS — I10 Essential (primary) hypertension: Secondary | ICD-10-CM | POA: Insufficient documentation

## 2018-05-29 DIAGNOSIS — J101 Influenza due to other identified influenza virus with other respiratory manifestations: Secondary | ICD-10-CM | POA: Insufficient documentation

## 2018-05-29 DIAGNOSIS — E119 Type 2 diabetes mellitus without complications: Secondary | ICD-10-CM | POA: Insufficient documentation

## 2018-05-29 DIAGNOSIS — J45901 Unspecified asthma with (acute) exacerbation: Secondary | ICD-10-CM | POA: Insufficient documentation

## 2018-05-29 MED ORDER — ALBUTEROL SULFATE (2.5 MG/3ML) 0.083% IN NEBU
5.0000 mg | INHALATION_SOLUTION | Freq: Once | RESPIRATORY_TRACT | Status: AC
  Administered 2018-05-29: 5 mg via RESPIRATORY_TRACT
  Filled 2018-05-29: qty 6

## 2018-05-29 NOTE — ED Triage Notes (Signed)
Patient reports she was dx with influenza yesterday. Patient c/o asthma exacerbation. Patient reports she is out of her albuterol inhaler - needs refill. Bilateral wheezes heard on auscultation.

## 2018-05-30 ENCOUNTER — Emergency Department
Admission: EM | Admit: 2018-05-30 | Discharge: 2018-05-30 | Disposition: A | Discharge status: Home / Self Care | Payer: Self-pay | Attending: Emergency Medicine | Admitting: Emergency Medicine

## 2018-05-30 DIAGNOSIS — J101 Influenza due to other identified influenza virus with other respiratory manifestations: Secondary | ICD-10-CM

## 2018-05-30 DIAGNOSIS — J45901 Unspecified asthma with (acute) exacerbation: Secondary | ICD-10-CM

## 2018-05-30 MED ORDER — ACETAMINOPHEN 500 MG PO TABS
ORAL_TABLET | ORAL | Status: AC
  Filled 2018-05-30: qty 2

## 2018-05-30 MED ORDER — IPRATROPIUM-ALBUTEROL 0.5-2.5 (3) MG/3ML IN SOLN
3.0000 mL | Freq: Once | RESPIRATORY_TRACT | Status: AC
  Administered 2018-05-30: 3 mL via RESPIRATORY_TRACT
  Filled 2018-05-30: qty 3

## 2018-05-30 MED ORDER — PREDNISONE 20 MG PO TABS
60.0000 mg | ORAL_TABLET | Freq: Once | ORAL | Status: AC
  Administered 2018-05-30: 60 mg via ORAL
  Filled 2018-05-30: qty 3

## 2018-05-30 MED ORDER — ACETAMINOPHEN 500 MG PO TABS
1000.0000 mg | ORAL_TABLET | Freq: Once | ORAL | Status: AC
  Administered 2018-05-30: 1000 mg via ORAL

## 2018-05-30 MED ORDER — ALBUTEROL SULFATE HFA 108 (90 BASE) MCG/ACT IN AERS: 2.0000 | INHALATION_SPRAY | Freq: Four times a day (QID) | RESPIRATORY_TRACT | 0 refills | Status: DC | PRN

## 2018-05-30 MED ORDER — PREDNISONE 50 MG PO TABS: 50.0000 mg | ORAL_TABLET | Freq: Every day | ORAL | 0 refills | Status: AC

## 2018-05-30 NOTE — ED Provider Notes (Signed)
[facility] Emergency Department Provider Note  ____________________________________________   First MD Initiated Contact with Patient 05/30/18 0206     (approximate)  I have reviewed the triage vital signs and the nursing notes.   HISTORY  Chief Complaint Asthma    HPI Cheryl Roberts is a 47 y.o. female who returns to the emergency department with persistent shortness of breath.  She was seen in our emergency department yesterday and was diagnosed with influenza A and prescribed Tamiflu which she was unable to afford.   She was also given cough medicine and ibuprofen.  She comes to the emergency department today because she has a history of asthma and she now feels increasing shortness of breath and she is out of albuterol.  Her shortness of breath was gradual onset slowly progressive is now moderate severity.  It is worse with exertion improved with rest.  Her fevers continue and are intermittent.  She initially became symptomatic with influenza 4 days ago at this point.   Past Medical History:  Diagnosis Date  . Asthma   . Cervical radiculopathy   . Chronic sinusitis   . Hypertension     Patient Active Problem List   Diagnosis Date Noted  . Adhesive capsulitis of left shoulder 10/15/2017  . Plantar fasciitis, right 10/15/2017  . Chronic pain of left upper extremity 09/19/2017  . Arthralgia of right temporomandibular joint 06/11/2017  . Pigmented skin lesion 06/11/2017  . 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 Spinal stenosis in cervical region 01/17/2017  . Chronic low back pain without sciatica 06/22/2016  . Strain of left trapezius muscle 06/22/2016  . Allergic rhinitis due to allergen 03/26/2016  . Hyperlipidemia associated with type 2 diabetes mellitus ([place]) 03/15/2016  . Hypertension 02/09/2016  . Type 2 diabetes mellitus with other specified complication ([place]) 46/65/9935  . DJD (degenerative joint disease) of cervical  spine 02/09/2016  . Obesity (BMI 30.0-34.9) 02/09/2016  . Asthma, mild intermittent 02/09/2016  . Sialoadenitis 02/28/2003    History reviewed. No pertinent surgical history.  Prior to Admission medications   Medication Sig Start Date End Date Taking? Authorizing Provider  albuterol (PROVENTIL HFA;VENTOLIN HFA) 108 (90 Base) MCG/ACT inhaler Inhale 2 puffs into the lungs every 6 (six) hours as needed for wheezing or shortness of breath. 05/30/18   Darel Hong, MD  aspirin 81 MG tablet Take 1 tablet (81 mg total) by mouth daily. 03/15/16   Karamalegos, Devonne Doughty, DO  atorvastatin (LIPITOR) 40 MG tablet Take 1 tablet (40 mg total) by mouth daily. 03/17/17   Karamalegos, Devonne Doughty, DO  Blood Glucose Monitoring Suppl (TRUE METRIX METER) w/Device KIT 1 each by Does not apply route as directed. 05/29/16   Karamalegos, Devonne Doughty, DO  brompheniramine-pseudoephedrine-DM 30-2-10 MG/5ML syrup Take 5 mLs by mouth 4 (four) times daily as needed. 05/27/18   Sable Feil, PA-C  cyclobenzaprine (FLEXERIL) 10 MG tablet Take 0.5-1 tablets (5-10 mg total) by mouth 3 (three) times daily as needed for muscle spasms. 02/27/18   Karamalegos, Devonne Doughty, DO  fluticasone (FLONASE) 50 MCG/ACT nasal spray Place 1 spray into both nostrils daily. For 2 to 4 weeks then as needed. 03/26/16   Karamalegos, Devonne Doughty, DO  glucose blood (TRUE METRIX BLOOD GLUCOSE TEST) test strip Check blood sugar twice daily. Dx E11.9 05/29/16   Olin Hauser, DO  hydrochlorothiazide (HYDRODIURIL) 25 MG tablet TAKE ONE TABLET BY MOUTH EVERY DAY 01/24/18   Olin Hauser, DO  ibuprofen (ADVIL,MOTRIN) 600 MG tablet Take 1 tablet (600 mg total) by mouth every 8 (eight) hours as needed. 05/27/18   Sable Feil, PA-C  JARDIANCE 25 MG TABS tablet TAKE ONE TABLET BY MOUTH EVERY DAY 04/16/18   Parks Ranger, Devonne Doughty, DO  LANCETS ULTRA FINE MISC 1 each by Does not apply route 2 (two) times daily. Check blood sugar  twice daily. Dx: E11.9 05/29/16   Olin Hauser, DO  lisinopril (PRINIVIL,ZESTRIL) 10 MG tablet TAKE ONE TABLET BY MOUTH EVERY DAY 01/24/18   Karamalegos, Devonne Doughty, DO  loratadine (CLARITIN) 10 MG tablet Take 1 tablet (10 mg total) by mouth daily. 03/26/16   Karamalegos, Devonne Doughty, DO  meloxicam (MOBIC) 15 MG tablet Take 1 tablet (15 mg total) by mouth daily. 04/08/17   Karamalegos, Devonne Doughty, DO  metFORMIN (GLUCOPHAGE-XR) 750 MG 24 hr tablet TAKE ONE TABLET BY MOUTH EVERY DAY WITH BREAKFAST 09/10/17   Karamalegos, Devonne Doughty, DO  predniSONE (DELTASONE) 50 MG tablet Take 1 tablet (50 mg total) by mouth daily for 4 days. 05/30/18 06/03/18  Darel Hong, MD    Allergies Amoxicillin  Family History  Problem Relation Age of Onset  . Hypertension Mother   . Hyperthyroidism Mother   . Gout Mother   . Diabetes Mother   . Arthritis Mother   . Cancer Father     Social History Social History   Tobacco Use  . Smoking status: Never Smoker  . Smokeless tobacco: Never Used  Substance Use Topics  . Alcohol use: Yes    Comment: occas wine or beer  . Drug use: No    Review of Systems Constitutional: Positive for fevers ENT: No sore throat. Cardiovascular: Positive for chest pain. Respiratory: Positive for shortness of breath. Gastrointestinal: No abdominal pain.  No nausea, no vomiting.  No diarrhea.  No constipation. Musculoskeletal: Negative for back pain. Neurological: Negative for headaches   ____________________________________________   PHYSICAL EXAM:  VITAL SIGNS: ED Triage Vitals  Enc Vitals Group     BP 05/29/18 2238 136/78     Pulse Rate 05/29/18 2238 92     Resp 05/29/18 2238 17     Temp 05/29/18 2238 99.1 F (37.3 C)     Temp Source 05/29/18 2238 Oral     SpO2 05/29/18 2238 95 %     Weight 05/29/18 2239 141 lb 1.5 oz (64 kg)     Height --      Head Circumference --      Peak Flow --      Pain Score 05/29/18 2239 7     Pain Loc --      Pain  Edu? --      Excl. in [place]? --     Constitutional: Alert and oriented x4 appears mildly short of breath although nontoxic no diaphoresis speaks full clear sentences Head: Atraumatic. Nose: No congestion/rhinnorhea. Mouth/Throat: No trismus Neck: No stridor.   Cardiovascular: Regular rate and rhythm Respiratory: Slightly increased respiratory effort although not using accessory muscles.  Expiratory wheezing throughout with prolonged expiratory phase although lung sounds are equal bilaterally Gastrointestinal: Soft nontender Neurologic:  Normal speech and language. No gross focal neurologic deficits are appreciated.  Skin:  Skin is warm, dry and intact. No rash noted.    ____________________________________________  LABS (all labs ordered are listed, but only abnormal results are displayed)  Labs Reviewed - No data to display  Reviewed the patient's lab work from yesterday showing that she is positive for influenza  A __________________________________________  EKG   ____________________________________________  RADIOLOGY  Chest x-ray reviewed by me with no acute disease ____________________________________________   DIFFERENTIAL includes but not limited to  Pneumonia, influenza, asthma exacerbation   PROCEDURES  Procedure(s) performed: no  Procedures  Critical Care performed: no  ____________________________________________   INITIAL IMPRESSION / ASSESSMENT AND PLAN / ED COURSE  Pertinent labs & imaging results that were available during my care of the patient were reviewed by me and considered in my medical decision making (see chart for details).   As part of my medical decision making, I reviewed the following data within the [place] History obtained from family if available, nursing notes, old chart and ekg, as well as notes from prior ED visits.  The patient has influenza A although now has an asthma exacerbation on top of it.  Given  multiple DuoNeb's here along with steroids with improvement in her symptoms.  I will prescribe albuterol for home along with 4 more days of steroids.  We discussed that she is now outside the window for treatment with Tamiflu and I do not recommend she fill that previous prescription.  Strict return precautions have been given.      ____________________________________________   FINAL CLINICAL IMPRESSION(S) / ED DIAGNOSES  Final diagnoses:  Moderate asthma with exacerbation, unspecified whether persistent  Influenza A      NEW MEDICATIONS STARTED DURING THIS VISIT:  Discharge Medication List as of 05/30/2018  3:36 AM    START taking these medications   Details  predniSONE (DELTASONE) 50 MG tablet Take 1 tablet (50 mg total) by mouth daily for 4 days., Starting Sat 05/30/2018, Until Wed 06/03/2018, Print         Note:  This document was prepared using Dragon voice recognition software and may include unintentional dictation errors.     Darel Hong, MD 06/01/18 2340

## 2018-05-30 NOTE — ED Notes (Signed)
ED Provider at bedside. 

## 2018-05-30 NOTE — ED Notes (Signed)
Reviewed discharge instructions, follow-up care, and prescriptions with patient. Patient verbalized understanding of all information reviewed. Patient stable, with no distress noted at this time.    

## 2018-05-30 NOTE — ED Notes (Signed)
Signature pad not working. Computer froze, this RN unable to pull up signature screen on another computer/device. Patient requesting to leave. Patient verbalized understanding of all information dicussed.

## 2018-05-30 NOTE — Discharge Instructions (Signed)
Please take all of your steroids as prescribed and use your inhaler as needed for severe symptoms.  Do not fill your Tamiflu as you are outside of the window of treatment.  Please follow-up with your primary care physician as needed and return to the emergency department sooner for any concerns whatsoever.  It was a pleasure to take care of you today, and thank you for coming to our emergency department.  If you have any questions or concerns before leaving please ask the nurse to grab me and I'm more than happy to go through your aftercare instructions again.  If you have any concerns once you are home that you are not improving or are in fact getting worse before you can make it to your follow-up appointment, please do not hesitate to call 911 and come back for further evaluation.  Merrily BrittleNeil Lorence Nagengast, MD  Results for orders placed or performed during the hospital encounter of 05/27/18  Influenza panel by PCR (type A & B)  Result Value Ref Range   Influenza A By PCR POSITIVE (A) NEGATIVE   Influenza B By PCR NEGATIVE NEGATIVE   Dg Chest 2 View  Result Date: 05/29/2018 CLINICAL DATA:  Cough. Asthma exacerbation. EXAM: CHEST - 2 VIEW COMPARISON:  11/22/2017 FINDINGS: Midline trachea. Normal heart size and mediastinal contours. No pleural effusion or pneumothorax. Clear lungs. IMPRESSION: No acute cardiopulmonary disease. Electronically Signed   By: Jeronimo GreavesKyle  Talbot M.D.   On: 05/29/2018 23:33

## 2018-06-05 ENCOUNTER — Ambulatory Visit (INDEPENDENT_AMBULATORY_CARE_PROVIDER_SITE_OTHER): Payer: Self-pay | Admitting: Family Medicine

## 2018-06-05 ENCOUNTER — Encounter: Payer: Self-pay | Admitting: Family Medicine

## 2018-06-05 VITALS — BP 142/82 | HR 87 | Temp 99.0°F | Resp 16 | Ht <= 58 in | Wt 133.6 lb

## 2018-06-05 DIAGNOSIS — J4521 Mild intermittent asthma with (acute) exacerbation: Secondary | ICD-10-CM

## 2018-06-05 DIAGNOSIS — J101 Influenza due to other identified influenza virus with other respiratory manifestations: Secondary | ICD-10-CM

## 2018-06-05 MED ORDER — PREDNISONE 10 MG PO TABS: ORAL_TABLET | ORAL | 0 refills | Status: DC

## 2018-06-05 MED ORDER — BALOXAVIR MARBOXIL(40 MG DOSE) 2 X 20 MG PO TBPK: 40.0000 mg | ORAL_TABLET | Freq: Once | ORAL | 0 refills | Status: AC

## 2018-06-05 MED ORDER — PSEUDOEPH-BROMPHEN-DM 30-2-10 MG/5ML PO SYRP: 5.0000 mL | ORAL_SOLUTION | Freq: Four times a day (QID) | ORAL | 0 refills | Status: DC | PRN

## 2018-06-05 NOTE — Patient Instructions (Addendum)
Thank you for coming to the office today.  RECENTLY You tested positive for Influenza A   START Xofluza sample - this is one dose and done - unfortunately it is a week late, may not be as effective  - Wash hands and cover cough very well to avoid spread of infection - For symptom control:      - Take Ibuprofen / Advil 400-600mg  every 6-8 hours as needed for fever / muscle aches, and may also take Tylenol 500-1000mg  per dose every 6-8 hours or 3 times a day, can alternate dosing  START Prednisone taper as instructed for asthma  Refilled cough syrup  Use Albuterol inhaler 2 puffs every 4-6 hours as needed   - Improve hydration with plenty of clear fluids  If significant worsening with poor fluid intake, worsening fever, difficulty breathing due to coughing, worsening body aches, weakness, or other more concerning symptoms difficulty breathing you can seek treatment at Emergency Department. Also if improved flu symptoms and then worsening days to week later with concerns for bronchitis, productive cough fever chills again we may need to check for possible pneumonia that can occur after the flu   Please schedule a Follow-up Appointment to: Return in about 1 week (around 06/12/2018), or if symptoms worsen or fail to improve, for flu / asthma.  If you have any other questions or concerns, please feel free to call the office or send a message through MyChart. You may also schedule an earlier appointment if necessary.  Additionally, you may be receiving a survey about your experience at our office within a few days to 1 week by e-mail or mail. We value your feedback.  Saralyn PilarAlexander Kloi Brodman, DO [name]

## 2018-06-05 NOTE — Progress Notes (Signed)
Subjective:    Patient ID: Cheryl Roberts, female    DOB: [birth year], 47 y.o.   MRN: [id number]  Cheryl Roberts is a 47 y.o. female presenting on 06/05/2018 for Influenza (pateint was diagnosed with influenza A also had Asthma and had 2 breathing treatment cough and congestion still there and hurts ribs and back and making her nauseaous, whizzing onset week)   HPI   ED FOLLOW-UP VISIT  Hospital/Location: [facility] Date of ED Visits: 05/27/18, then 05/30/18  Reason for Presenting to ED: Influenza A / Asthma Exacerbation  FOLLOW-UP - ED provider note and record have been reviewed - Patient presents today about 6 days after recent ED visit. Brief summary of recent course, patient had symptoms of recent influenza / asthma exacerbation with persistent, presented to ED initially on 05/27/18, testing in ED with INFLUENZA A positive on rapid test and she was given rx for Tamiflu and Bromphed cough syrup, ultimately she was unable to afford the Tamiflu rx, and went back with worse coughing on 05/30/18, she was treated for asthma exac at that time with wheezing and dyspnea, and treated w/ albuterol nebulizer, duoneb, and prednisone with improvement, had negative chest x-ray, discharged.  - Today reports overall did well at first after discharge from ED, she took prednisone 50mg  daily for 4 more days, and continued albuterol, but then her cough and wheezing got worse after finished medicine, she finished cough syrup as well. She is not worse but still has persistent coughing and difficulty sleep at night with breathing. - Tessalon did not help her cough  - New medications on discharge: Prednisone 50mg  daily x 4 days, Tessalon Perls - Changes to current meds on discharge: None  Denies any fevers, chills, sweats, body aches, nausea vomiting abdominal pain  Additional updates - she is scheduled for Cervical spinal surgery on 07/01/18 - she will follow-up with [facility] Ortho[facility]Sp[facility] specialist as planned, and  return here if needs any updates for her long term disability.  Health Maintenance: Did not receive Flu Shot based on our records this season. She is ill today with recent dx Influenza, will reconsider in January 2020, she may return for Flu Vaccine.  Depression screen [facility] 2/9 06/05/2018 02/27/2018 10/15/2017  Decreased Interest 0 0 0  Down, Depressed, Hopeless 0 0 0  PHQ - 2 Score 0 0 0  Difficult doing work/chores - - -    Social History   Tobacco Use  . Smoking status: Never Smoker  . Smokeless tobacco: Never Used  Substance Use Topics  . Alcohol use: Yes    Comment: occas wine or beer  . Drug use: No    Review of Systems Per HPI unless specifically indicated above     Objective:    BP (!) 142/82   Pulse 87   Temp 99 F (37.2 C)   Resp 16   Ht 4\' 9"  (1.448 m)   Wt 133 lb 9.6 oz (60.6 kg)   SpO2 98%   BMI 28.91 kg/m   Wt Readings from Last 3 Encounters:  06/05/18 133 lb 9.6 oz (60.6 kg)  05/29/18 141 lb 1.5 oz (64 kg)  05/27/18 140 lb (63.5 kg)    Physical Exam Vitals signs and nursing note reviewed.  Constitutional:      General: She is not in acute distress.    Appearance: She is well-developed. She is not diaphoretic.     Comments: Mildly ill appearing, uncomfortable coughing, cooperative  HENT:     Head:  Normocephalic and atraumatic.  Eyes:     General:        Right eye: No discharge.        Left eye: No discharge.     Conjunctiva/sclera: Conjunctivae normal.  Cardiovascular:     Rate and Rhythm: Normal rate.  Pulmonary:     Effort: Pulmonary effort is normal.     Breath sounds: Wheezing present. No rhonchi or rales.     Comments: Reduced air movement with some tight breath sounds, and scattered high pitched end exp wheezes bilaterally. Frequent coughing. Skin:    General: Skin is warm and dry.     Findings: No erythema or rash.  Neurological:     Mental Status: She is alert and oriented to person, place, and time.  Psychiatric:        Behavior:  Behavior normal.     Comments: Well groomed, good eye contact, normal speech and thoughts    I have personally reviewed the radiology report from Chest X-ray on 05/30/18.  CLINICAL DATA: Cough. Asthma exacerbation.  EXAM: CHEST - 2 VIEW  COMPARISON: 11/22/2017  FINDINGS: Midline trachea. Normal heart size and mediastinal contours. No pleural effusion or pneumothorax. Clear lungs.  IMPRESSION: No acute cardiopulmonary disease.   Electronically Signed By: Jeronimo GreavesKyle Talbot M.D. On: 05/29/2018 23:33   Results for orders placed or performed during the hospital encounter of 05/27/18  Influenza panel by PCR (type A & B)  Result Value Ref Range   Influenza A By PCR POSITIVE (A) NEGATIVE   Influenza B By PCR NEGATIVE NEGATIVE      Assessment & Plan:   Problem List Items Addressed This Visit    Asthma, mild intermittent   Relevant Medications   predniSONE (DELTASONE) 10 MG tablet   brompheniramine-pseudoephedrine-DM 30-2-10 MG/5ML syrup    Other Visit Diagnoses    Influenza A    -  Primary   Relevant Medications   Baloxavir Marboxil,40 MG Dose, (XOFLUZA) 2 x 20 MG TBPK      Consistent with recent Influenza A infection triggering a secondary mild-moderate acute asthma exacerbation in setting of mild intermittent asthma. - ED visit x 2 recently, 12/18 and 12/21, already had Chest X-ray 6 days ago that was negative, did not receive anti flu medicine due to cost - Improved on prednisone, then off and worse again  Plan: For asthma - extend prednisone into a taper now pred 60mg  down to 10mg  over 6 days - Continue Albuterol 2 puffs q 4-6 hour PRN wheezing/cough/SOB x 3 days regularly, then PRN - Refill cough syrup  For flu - given a sample in office of Xofluza 20mg  x 2 = 40mg  1 dose based on weight for flu medicine, advised that it is not preferred to use >1 week from onset diagnosis/symptoms, but given still low grade temp and some flu like symptoms flaring up her asthma, she could  try this as an alternative to the tamiflu that she was unable to afford previously from ED rx  Return criteria given to follow-up vs when to go to ED - if improved but not resolved may return for Chest X-ray within a week if needed for possible post flu complication / PNA  Meds ordered this encounter  Medications  . Baloxavir Marboxil,40 MG Dose, (XOFLUZA) 2 x 20 MG TBPK    Sig: Take 40 mg by mouth once for 1 dose. For Flu    Dispense:  1 each    Refill:  0  . predniSONE (DELTASONE)  10 MG tablet    Sig: Take 6 tabs with breakfast Day 1, 5 tabs Day 2, 4 tabs Day 3, 3 tabs Day 4, 2 tabs Day 5, 1 tab Day 6.    Dispense:  21 tablet    Refill:  0  . brompheniramine-pseudoephedrine-DM 30-2-10 MG/5ML syrup    Sig: Take 5 mLs by mouth 4 (four) times daily as needed.    Dispense:  118 mL    Refill:  0      Follow up plan: Return in about 1 week (around 06/12/2018), or if symptoms worsen or fail to improve, for flu / asthma.   Saralyn PilarAlexander , DO [facility] 06/05/2018, 2:58 PM

## 2018-06-22 ENCOUNTER — Telehealth: Payer: Self-pay | Admitting: Family Medicine

## 2018-06-22 NOTE — Telephone Encounter (Signed)
Pt needs referral to Dr. Myles Gip at [facility] in [place].  She was approved for charity care and want all her meds to be sent to pharmacy down there.

## 2018-06-22 NOTE — Telephone Encounter (Signed)
I attempted to call patient after hours today on Monday 06/22/18  I am confused by this request of hers.  Dr Myles Gip is a primary care internal medicine doctor at [facility]. She cannot treat Miriana as a regular doctor because Fareeda is our patient.  If patient needs to see a [facility] doctor to get all of her medicines through [facility] - she may need to transfer her care to Dr Barnetta Chapel at [facility].  I am not sure I can help send her rx to [facility] and still provide care for her through [facility].  -------------------------------  She should call us back to clarify this request - if needed we can contact Dr Barnetta Chapel and ask how the [facility] rx works and what she thinks.  [name], DO [facility] 06/22/2018, 5:33 PM

## 2018-06-23 NOTE — Telephone Encounter (Signed)
Thank you. Will update her chart.  [name], DO [facility] 06/23/2018, 12:42 PM

## 2018-06-23 NOTE — Telephone Encounter (Signed)
patient is transferring to [facility] due to [facility].

## 2018-06-24 ENCOUNTER — Telehealth: Payer: Self-pay | Admitting: Family Medicine

## 2018-06-24 NOTE — Telephone Encounter (Signed)
Pt called wanted to know if she would take metformin (2) instead of Jardiance she said that she could not afford jardiace.Pt call back # is [phone]

## 2018-06-24 NOTE — Telephone Encounter (Addendum)
Called patient - clarified that she agrees with last phone call from 06/22/18.  She will plan to transfer her care [facility] - then she will no longer be our patient at this time currently while [facility].  I will delete the Jardiance from her medication list - but due to cost she should check with [facility] to see if it can be [facility].  She asked if she could increase Metformin XR 750 from 1 to 1.5 pills. She will try this now had mild elevated blood sugar off Jardiance 150-180.  She will call to schedule w/ Dr Barnetta ChapelWhelan or other [facility] and notify us when established.  Saralyn PilarAlexander Marlis Oldaker, DO [facility] 06/24/2018, 4:38 PM

## 2018-07-02 MED ORDER — CELECOXIB 200 MG PO CAPS
200.00 | ORAL_CAPSULE | ORAL | Status: DC
Start: 2018-07-03 — End: 2018-07-02

## 2018-07-02 MED ORDER — LACTATED RINGERS IV SOLN
20.00 | INTRAVENOUS | Status: DC
Start: ? — End: 2018-07-02

## 2018-07-02 MED ORDER — LISINOPRIL 10 MG PO TABS
10.00 | ORAL_TABLET | ORAL | Status: DC
Start: 2018-07-03 — End: 2018-07-02

## 2018-07-02 MED ORDER — ONDANSETRON HCL 4 MG/2ML IJ SOLN
4.00 | INTRAMUSCULAR | Status: DC
Start: ? — End: 2018-07-02

## 2018-07-02 MED ORDER — GENERIC EXTERNAL MEDICATION
2.00 | Status: DC
Start: 2018-07-02 — End: 2018-07-02

## 2018-07-02 MED ORDER — INSULIN LISPRO 100 UNIT/ML ~~LOC~~ SOLN
0.00 | SUBCUTANEOUS | Status: DC
Start: 2018-07-02 — End: 2018-07-02

## 2018-07-02 MED ORDER — DOCUSATE SODIUM 100 MG PO CAPS
100.00 | ORAL_CAPSULE | ORAL | Status: DC
Start: 2018-07-02 — End: 2018-07-02

## 2018-07-02 MED ORDER — PREGABALIN 75 MG PO CAPS
75.00 | ORAL_CAPSULE | ORAL | Status: DC
Start: 2018-07-02 — End: 2018-07-02

## 2018-07-02 MED ORDER — NALOXONE HCL 4 MG/10ML IJ SOLN
.10 | INTRAMUSCULAR | Status: DC
Start: ? — End: 2018-07-02

## 2018-07-02 MED ORDER — ACETAMINOPHEN 325 MG PO TABS
650.00 | ORAL_TABLET | ORAL | Status: DC
Start: 2018-07-02 — End: 2018-07-02

## 2018-07-02 MED ORDER — DIPHENHYDRAMINE HCL 25 MG PO CAPS
25.00 | ORAL_CAPSULE | ORAL | Status: DC
Start: ? — End: 2018-07-02

## 2018-07-02 MED ORDER — GENERIC EXTERNAL MEDICATION
5.00 | Status: DC
Start: ? — End: 2018-07-02

## 2018-07-02 MED ORDER — METFORMIN HCL 500 MG PO TABS
750.00 | ORAL_TABLET | ORAL | Status: DC
Start: 2018-07-03 — End: 2018-07-02

## 2018-07-02 MED ORDER — POLYETHYLENE GLYCOL 3350 17 G PO PACK
17.00 | PACK | ORAL | Status: DC
Start: 2018-07-03 — End: 2018-07-02

## 2018-07-02 MED ORDER — HYDROCHLOROTHIAZIDE 25 MG PO TABS
25.00 | ORAL_TABLET | ORAL | Status: DC
Start: 2018-07-03 — End: 2018-07-02

## 2018-07-02 MED ORDER — ATORVASTATIN CALCIUM 40 MG PO TABS
40.00 | ORAL_TABLET | ORAL | Status: DC
Start: 2018-07-03 — End: 2018-07-02

## 2018-07-02 MED ORDER — DEXTROSE 50 % IV SOLN
12.50 | INTRAVENOUS | Status: DC
Start: ? — End: 2018-07-02

## 2018-07-06 ENCOUNTER — Telehealth: Payer: Self-pay | Admitting: Family Medicine

## 2018-07-06 NOTE — Telephone Encounter (Signed)
Brea with [employer] said a fax was sent 1/15 needing clarification.  Please call [phone]

## 2018-07-06 NOTE — Telephone Encounter (Signed)
Left message for Gracelyn Nurse to call back.

## 2018-07-07 NOTE — Telephone Encounter (Signed)
Advised Brea to refax [place] a paperwork this will the last time Dr. Kirtland Bouchard will fill out since patient has transferred care to [facility].

## 2018-07-08 NOTE — Telephone Encounter (Signed)
I reviewed the paperwork from the [employer]. This is a different form than the previous that I have completed.  I am unsure how to proceed with it because of the timeline in question. It seems to be clarifying her symptoms back on 01/05/18 and then asks about her function with regards to reaching non extended seated at desk or waist level.  I attempted to call Narda Bonds back - ph [phone], and was unable to reach her today 1/29.  I asked for a call back to clarify how to complete this form. I will be out of office in work related meeting on Thurs 1/30 and back on Friday 1/31.  If she calls back could you clarify the following:  Yes or No question on the form - if the patient ... "is capable of non-extended reaching at waist/desk level on an occasional to frequent basis to perform activities such as answering a telephone ,keyboarding sorting documents?"   Is this question referring to her function back in July 2019 - or is it referring to current function now?  I reviewed her chart and spoke to the patient. I would answer this as "Yes" patient is or was capable of reaching at non extended waist/desk level activities.  The form is now ready and signed - in my mail inbox on top. If they confirm this, then we can go ahead and fax it. If I do not hear back by end of week, we can go ahead and fax it as well by Friday 1/31.  [name], DO [facility] 07/08/2018, 6:01 PM

## 2018-07-09 NOTE — Telephone Encounter (Signed)
Left message for Audree Camel to call back.

## 2018-07-09 NOTE — Telephone Encounter (Signed)
As per Cheryl Roberts question 1 is Yes and 2 is from 10/27/2017 till present this form basically not in regards to her long term disability but for her life insurance claim and in order to be eligible to waive premium patient have to completely disable that's why the forms looks different from previous one. I have not faxed the forms yet just want you to read her reply before I can fax and Cheryl Roberts is aware.

## 2018-07-09 NOTE — Telephone Encounter (Signed)
Great. Thank you. I will review and finalize form in office on 1/31 to be faxed.  [name], DO [facility] 07/09/2018, 10:13 PM

## 2018-08-19 ENCOUNTER — Emergency Department
Admission: EM | Admit: 2018-08-19 | Discharge: 2018-08-19 | Disposition: A | Discharge status: Home / Self Care | Payer: Medicaid Other | Attending: Emergency Medicine | Admitting: Emergency Medicine

## 2018-08-19 ENCOUNTER — Other Ambulatory Visit: Payer: Self-pay

## 2018-08-19 ENCOUNTER — Encounter: Payer: Self-pay | Admitting: Emergency Medicine

## 2018-08-19 DIAGNOSIS — B9789 Other viral agents as the cause of diseases classified elsewhere: Secondary | ICD-10-CM | POA: Insufficient documentation

## 2018-08-19 DIAGNOSIS — E119 Type 2 diabetes mellitus without complications: Secondary | ICD-10-CM | POA: Insufficient documentation

## 2018-08-19 DIAGNOSIS — J45909 Unspecified asthma, uncomplicated: Secondary | ICD-10-CM | POA: Insufficient documentation

## 2018-08-19 DIAGNOSIS — I10 Essential (primary) hypertension: Secondary | ICD-10-CM | POA: Insufficient documentation

## 2018-08-19 DIAGNOSIS — J069 Acute upper respiratory infection, unspecified: Secondary | ICD-10-CM

## 2018-08-19 DIAGNOSIS — Z79899 Other long term (current) drug therapy: Secondary | ICD-10-CM | POA: Insufficient documentation

## 2018-08-19 MED ORDER — PSEUDOEPH-BROMPHEN-DM 30-2-10 MG/5ML PO SYRP: 5.0000 mL | ORAL_SOLUTION | Freq: Four times a day (QID) | ORAL | 0 refills | Status: DC | PRN

## 2018-08-19 NOTE — ED Triage Notes (Signed)
Pt to ED via POV c/o cough x 1 day. Pt states that the cough is making her head hurt. Pt is in NAD at this time.

## 2018-08-19 NOTE — ED Notes (Signed)
See triage note  Presents with cough and congestion    Also low grade fever on arrival  Sx's started yesterday  States she is having headache with  cough

## 2018-08-19 NOTE — ED Provider Notes (Signed)
[facility] Emergency Department Provider Note   ____________________________________________   First MD Initiated Contact with Patient 08/19/18 [phone]     (approximate)  I have reviewed the triage vital signs and the nursing notes.   HISTORY  Chief Complaint Cough    HPI Cheryl Roberts is a 48 y.o. female patient presents with cough, nasal congestion, frontal headache which began yesterday.  Patient headache increased with cough.  Patient denies fever, nausea, vomiting, or diarrhea.  Patient rates her pain discomfort as a 5/10.  Patient scribed pain is "achy".  No palliative measure for complaint.  Past Medical History:  Diagnosis Date  . Asthma   . Cervical radiculopathy   . Chronic sinusitis   . Hypertension     Patient Active Problem List   Diagnosis Date Noted  . Adhesive capsulitis of left shoulder 10/15/2017  . Plantar fasciitis, right 10/15/2017  . Chronic pain of left upper extremity 09/19/2017  . Arthralgia of right temporomandibular joint 06/11/2017  . Pigmented skin lesion 06/11/2017  . 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 Spinal stenosis in cervical region 01/17/2017  . Chronic low back pain without sciatica 06/22/2016  . Strain of left trapezius muscle 06/22/2016  . Allergic rhinitis due to allergen 03/26/2016  . Hyperlipidemia associated with type 2 diabetes mellitus ([place]) 03/15/2016  . Hypertension 02/09/2016  . Type 2 diabetes mellitus with other specified complication ([place]) 74/25/9563  . DJD (degenerative joint disease) of cervical spine 02/09/2016  . Obesity (BMI 30.0-34.9) 02/09/2016  . Asthma, mild intermittent 02/09/2016  . Sialoadenitis 02/28/2003    History reviewed. No pertinent surgical history.  Prior to Admission medications   Medication Sig Start Date End Date Taking? Authorizing Provider  albuterol (PROVENTIL HFA;VENTOLIN HFA) 108 (90 Base) MCG/ACT inhaler Inhale 2 puffs into the lungs  every 6 (six) hours as needed for wheezing or shortness of breath. 05/30/18   Darel Hong, MD  aspirin 81 MG tablet Take 1 tablet (81 mg total) by mouth daily. 03/15/16   Karamalegos, Devonne Doughty, DO  atorvastatin (LIPITOR) 40 MG tablet Take 1 tablet (40 mg total) by mouth daily. 03/17/17   Karamalegos, Devonne Doughty, DO  Blood Glucose Monitoring Suppl (TRUE METRIX METER) w/Device KIT 1 each by Does not apply route as directed. 05/29/16   Karamalegos, Devonne Doughty, DO  brompheniramine-pseudoephedrine-DM 30-2-10 MG/5ML syrup Take 5 mLs by mouth 4 (four) times daily as needed. 08/19/18   Sable Feil, PA-C  cyclobenzaprine (FLEXERIL) 10 MG tablet Take 0.5-1 tablets (5-10 mg total) by mouth 3 (three) times daily as needed for muscle spasms. 02/27/18   Karamalegos, Devonne Doughty, DO  fluticasone (FLONASE) 50 MCG/ACT nasal spray Place 1 spray into both nostrils daily. For 2 to 4 weeks then as needed. 03/26/16   Karamalegos, Devonne Doughty, DO  glucose blood (TRUE METRIX BLOOD GLUCOSE TEST) test strip Check blood sugar twice daily. Dx E11.9 05/29/16   Olin Hauser, DO  hydrochlorothiazide (HYDRODIURIL) 25 MG tablet TAKE ONE TABLET BY MOUTH EVERY DAY 01/24/18   Karamalegos, Devonne Doughty, DO  LANCETS ULTRA FINE MISC 1 each by Does not apply route 2 (two) times daily. Check blood sugar twice daily. Dx: E11.9 05/29/16   Olin Hauser, DO  lisinopril (PRINIVIL,ZESTRIL) 10 MG tablet TAKE ONE TABLET BY MOUTH EVERY DAY 01/24/18   Karamalegos, Devonne Doughty, DO  loratadine (CLARITIN) 10 MG tablet Take 1 tablet (10 mg total) by mouth daily. 03/26/16   Karamalegos, Devonne Doughty,  DO  metFORMIN (GLUCOPHAGE-XR) 750 MG 24 hr tablet TAKE ONE TABLET BY MOUTH EVERY DAY WITH BREAKFAST 09/10/17   Karamalegos, Devonne Doughty, DO    Allergies Amoxicillin  Family History  Problem Relation Age of Onset  . Hypertension Mother   . Hyperthyroidism Mother   . Gout Mother   . Diabetes Mother   . Arthritis Mother   .  Cancer Father     Social History Social History   Tobacco Use  . Smoking status: Never Smoker  . Smokeless tobacco: Never Used  Substance Use Topics  . Alcohol use: Yes    Comment: occas wine or beer  . Drug use: No    Review of Systems  Constitutional: No fever/chills Eyes: No visual changes. ENT: No sore throat. Cardiovascular: Denies chest pain. Respiratory: Denies shortness of breath. Gastrointestinal: No abdominal pain.  No nausea, no vomiting.  No diarrhea.  No constipation. Genitourinary: Negative for dysuria. Musculoskeletal: Negative for back pain. Skin: Negative for rash. Neurological: Negative for headaches, focal weakness or numbness. Endocrine:  Diabetes, hyperlipidemia, hypertension. Allergic/Immunilogical: Amoxicillin. ____________________________________________   PHYSICAL EXAM:  VITAL SIGNS: ED Triage Vitals  Enc Vitals Group     BP 08/19/18 0942 136/78     Pulse Rate 08/19/18 0942 97     Resp 08/19/18 0942 16     Temp 08/19/18 0942 99.9 F (37.7 C)     Temp Source 08/19/18 0942 Oral     SpO2 08/19/18 0942 96 %     Weight 08/19/18 0943 138 lb (62.6 kg)     Height 08/19/18 0943 _0  (1.448 m)     Head Circumference --      Peak Flow --      Pain Score 08/19/18 0943 5     Pain Loc --      Pain Edu? --      Excl. in [place]? --    Constitutional: Alert and oriented. Well appearing and in no acute distress. Nose: Maxillary guarding with edematous nasal turbinates.. Mouth/Throat: Mucous membranes are moist.  Oropharynx non-erythematous.  Postnasal drainage. Neck: No stridor.   Hematological/Lymphatic/Immunilogical: No cervical lymphadenopathy. Cardiovascular: Normal rate, regular rhythm. Grossly normal heart sounds.  Good peripheral circulation. Respiratory: Normal respiratory effort.  No retractions. Lungs CTAB. Skin:  Skin is warm, dry and intact. No rash noted. Psychiatric: Mood and affect are normal. Speech and behavior are normal.   ____________________________________________   LABS (all labs ordered are listed, but only abnormal results are displayed)  Labs Reviewed - No data to display ____________________________________________  EKG   ____________________________________________  RADIOLOGY  ED MD interpretation:    Official radiology report(s): No results found.  ____________________________________________   PROCEDURES  Procedure(s) performed (including Critical Care):  Procedures   ____________________________________________   INITIAL IMPRESSION / ASSESSMENT AND PLAN / ED COURSE  As part of my medical decision making, I reviewed the following data within the [place]         Patient presents with 1 day of cough and nasal congestion.  Patient physical exam consistent with viral URI with cough.  Patient given discharge care instruction advised take medication as directed.  Patient vies follow-up PCP.      ____________________________________________   FINAL CLINICAL IMPRESSION(S) / ED DIAGNOSES  Final diagnoses:  Viral URI with cough     ED Discharge Orders         Ordered    brompheniramine-pseudoephedrine-DM 30-2-10 MG/5ML syrup  4 times daily PRN  08/19/18 1009           Note:  This document was prepared using Dragon voice recognition software and may include unintentional dictation errors.    Sable Feil, PA-C 08/19/18 1013    Nance Pear, MD 08/19/18 1118

## 2018-08-22 ENCOUNTER — Other Ambulatory Visit: Payer: Self-pay

## 2018-08-22 ENCOUNTER — Emergency Department
Admission: EM | Admit: 2018-08-22 | Discharge: 2018-08-22 | Disposition: A | Discharge status: Home / Self Care | Payer: Medicaid Other | Attending: Emergency Medicine | Admitting: Emergency Medicine

## 2018-08-22 ENCOUNTER — Encounter: Payer: Self-pay | Admitting: Emergency Medicine

## 2018-08-22 DIAGNOSIS — Z7984 Long term (current) use of oral hypoglycemic drugs: Secondary | ICD-10-CM | POA: Insufficient documentation

## 2018-08-22 DIAGNOSIS — J45909 Unspecified asthma, uncomplicated: Secondary | ICD-10-CM | POA: Insufficient documentation

## 2018-08-22 DIAGNOSIS — I10 Essential (primary) hypertension: Secondary | ICD-10-CM | POA: Insufficient documentation

## 2018-08-22 DIAGNOSIS — J209 Acute bronchitis, unspecified: Secondary | ICD-10-CM | POA: Insufficient documentation

## 2018-08-22 DIAGNOSIS — Z7982 Long term (current) use of aspirin: Secondary | ICD-10-CM | POA: Insufficient documentation

## 2018-08-22 DIAGNOSIS — E119 Type 2 diabetes mellitus without complications: Secondary | ICD-10-CM | POA: Insufficient documentation

## 2018-08-22 DIAGNOSIS — Z79899 Other long term (current) drug therapy: Secondary | ICD-10-CM | POA: Insufficient documentation

## 2018-08-22 MED ORDER — AZITHROMYCIN 250 MG PO TABS: ORAL_TABLET | ORAL | 0 refills | Status: DC

## 2018-08-22 MED ORDER — GUAIFENESIN-CODEINE 100-10 MG/5ML PO SYRP: 5.0000 mL | ORAL_SOLUTION | Freq: Three times a day (TID) | ORAL | 0 refills | Status: DC | PRN

## 2018-08-22 MED ORDER — IPRATROPIUM-ALBUTEROL 20-100 MCG/ACT IN AERS: 1.0000 | INHALATION_SPRAY | Freq: Four times a day (QID) | RESPIRATORY_TRACT | 0 refills | Status: AC | PRN

## 2018-08-22 MED ORDER — ALBUTEROL SULFATE HFA 108 (90 BASE) MCG/ACT IN AERS: 2.0000 | INHALATION_SPRAY | Freq: Four times a day (QID) | RESPIRATORY_TRACT | 1 refills | Status: AC | PRN

## 2018-08-22 NOTE — ED Triage Notes (Signed)
Cough and nasal drainage x 5 days.

## 2018-08-22 NOTE — ED Provider Notes (Signed)
[facility] Emergency Department Provider Note  ____________________________________________  Time seen: Approximately 11:49 AM  I have reviewed the triage vital signs and the nursing notes.   HISTORY  Chief Complaint Cough   HPI Cheryl Roberts is a 48 y.o. female who presents emergency department for treatment and evaluation of cough with nasal drainage for the past 5 days.  She was evaluated here just after onset of symptoms and was given a prescription for Bromfed.  Patient states that she has been taking that without much relief.  She states that she has been using her inhaler about every 3-4 hours but continues to have cough and congestion.  She denies fever.    Past Medical History:  Diagnosis Date  . Asthma   . Cervical radiculopathy   . Chronic sinusitis   . Hypertension     Patient Active Problem List   Diagnosis Date Noted  . Adhesive capsulitis of left shoulder 10/15/2017  . Plantar fasciitis, right 10/15/2017  . Chronic pain of left upper extremity 09/19/2017  . Arthralgia of right temporomandibular joint 06/11/2017  . Pigmented skin lesion 06/11/2017  . 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 Spinal stenosis in cervical region 01/17/2017  . Chronic low back pain without sciatica 06/22/2016  . Strain of left trapezius muscle 06/22/2016  . Allergic rhinitis due to allergen 03/26/2016  . Hyperlipidemia associated with type 2 diabetes mellitus ([place]) 03/15/2016  . Hypertension 02/09/2016  . Type 2 diabetes mellitus with other specified complication ([place]) 33/38/3291  . DJD (degenerative joint disease) of cervical spine 02/09/2016  . Obesity (BMI 30.0-34.9) 02/09/2016  . Asthma, mild intermittent 02/09/2016  . Sialoadenitis 02/28/2003    History reviewed. No pertinent surgical history.  Prior to Admission medications   Medication Sig Start Date End Date Taking? Authorizing Provider  albuterol (PROVENTIL HFA;VENTOLIN  HFA) 108 (90 Base) MCG/ACT inhaler Inhale 2 puffs into the lungs every 6 (six) hours as needed for wheezing or shortness of breath. 08/22/18   Pami Wool, Johnette Abraham B, FNP  aspirin 81 MG tablet Take 1 tablet (81 mg total) by mouth daily. 03/15/16   Karamalegos, Devonne Doughty, DO  atorvastatin (LIPITOR) 40 MG tablet Take 1 tablet (40 mg total) by mouth daily. 03/17/17   Karamalegos, Devonne Doughty, DO  azithromycin (ZITHROMAX) 250 MG tablet 2 tablets today, then 1 tablet for the next 4 days. 08/22/18   Aadhya Bustamante B, FNP  Blood Glucose Monitoring Suppl (TRUE METRIX METER) w/Device KIT 1 each by Does not apply route as directed. 05/29/16   Karamalegos, Devonne Doughty, DO  brompheniramine-pseudoephedrine-DM 30-2-10 MG/5ML syrup Take 5 mLs by mouth 4 (four) times daily as needed. 08/19/18   Sable Feil, PA-C  cyclobenzaprine (FLEXERIL) 10 MG tablet Take 0.5-1 tablets (5-10 mg total) by mouth 3 (three) times daily as needed for muscle spasms. 02/27/18   Karamalegos, Devonne Doughty, DO  fluticasone (FLONASE) 50 MCG/ACT nasal spray Place 1 spray into both nostrils daily. For 2 to 4 weeks then as needed. 03/26/16   Karamalegos, Devonne Doughty, DO  glucose blood (TRUE METRIX BLOOD GLUCOSE TEST) test strip Check blood sugar twice daily. Dx E11.9 05/29/16   Olin Hauser, DO  guaiFENesin-codeine (ROBITUSSIN AC) 100-10 MG/5ML syrup Take 5 mLs by mouth 3 (three) times daily as needed for cough. 08/22/18   Linde Wilensky, Johnette Abraham B, FNP  hydrochlorothiazide (HYDRODIURIL) 25 MG tablet TAKE ONE TABLET BY MOUTH EVERY DAY 01/24/18   Parks Ranger, Devonne Doughty, DO  Ipratropium-Albuterol (COMBIVENT)  20-100 MCG/ACT AERS respimat Inhale 1 puff into the lungs every 6 (six) hours as needed for wheezing or shortness of breath. 08/22/18   Juniper Cobey, Johnette Abraham B, FNP  LANCETS ULTRA FINE MISC 1 each by Does not apply route 2 (two) times daily. Check blood sugar twice daily. Dx: E11.9 05/29/16   Olin Hauser, DO  lisinopril (PRINIVIL,ZESTRIL) 10 MG  tablet TAKE ONE TABLET BY MOUTH EVERY DAY 01/24/18   Karamalegos, Devonne Doughty, DO  loratadine (CLARITIN) 10 MG tablet Take 1 tablet (10 mg total) by mouth daily. 03/26/16   Karamalegos, Devonne Doughty, DO  metFORMIN (GLUCOPHAGE-XR) 750 MG 24 hr tablet TAKE ONE TABLET BY MOUTH EVERY DAY WITH BREAKFAST 09/10/17   Karamalegos, Devonne Doughty, DO    Allergies Amoxicillin  Family History  Problem Relation Age of Onset  . Hypertension Mother   . Hyperthyroidism Mother   . Gout Mother   . Diabetes Mother   . Arthritis Mother   . Cancer Father     Social History Social History   Tobacco Use  . Smoking status: Never Smoker  . Smokeless tobacco: Never Used  Substance Use Topics  . Alcohol use: Yes    Comment: occas wine or beer  . Drug use: No    Review of Systems Constitutional: Negative for fever/chills.  Normal appetite. ENT: No sore throat. Cardiovascular: Denies chest pain. Respiratory: Negative for shortness of breath.  Positive for cough.  Positive for wheezing.  Gastrointestinal: Negative for nausea, no vomiting.  No diarrhea.  Musculoskeletal: Negative for body aches Skin: Negative for rash. Neurological: Negative for headaches ____________________________________________   PHYSICAL EXAM:  VITAL SIGNS: ED Triage Vitals [08/22/18 0921]  Enc Vitals Group     BP 139/72     Pulse Rate 90     Resp 18     Temp 98.7 F (37.1 C)     Temp Source Oral     SpO2 94 %     Weight 138 lb (62.6 kg)     Height '4\' 9"'  (1.448 m)     Head Circumference      Peak Flow      Pain Score 0     Pain Loc      Pain Edu?      Excl. in [place]?     Constitutional: Alert and oriented.  Well appearing and in no acute distress. Eyes: Conjunctivae are normal. Ears: Bilateral tympanic membranes are normal Nose: Maxillary sinus congestion noted; no rhinnorhea. Mouth/Throat: Mucous membranes are moist.  Oropharynx normal. Tonsils without exudate. Uvula midline. Neck: No stridor.  Lymphatic: No  cervical lymphadenopathy. Cardiovascular: Normal rate, regular rhythm. Good peripheral circulation. Respiratory: Respirations are even and unlabored.  No retractions.  Scattered rhonchi noted on auscultation that clears with cough.. Gastrointestinal: Soft and nontender.  Musculoskeletal: FROM x 4 extremities.  Neurologic:  Normal speech and language. Skin:  Skin is warm, dry and intact. No rash noted. Psychiatric: Mood and affect are normal. Speech and behavior are normal.  ____________________________________________   LABS (all labs ordered are listed, but only abnormal results are displayed)  Labs Reviewed - No data to display ____________________________________________  EKG  Not indicated ____________________________________________  RADIOLOGY  Not indicated ____________________________________________   PROCEDURES  Procedure(s) performed: None  Critical Care performed: No ____________________________________________   INITIAL IMPRESSION / ASSESSMENT AND PLAN / ED COURSE  48 y.o. female presents to the emergency department for treatment and evaluation of cough and congestion.  I do not hear any wheezing  at this time and therefore do not think that she needs prednisone today especially in light of comorbidity of diabetes.  She will be treated with azithromycin, Robitussin-AC, refill of albuterol, and Combivent.  She was advised to stop using the Combivent after her current symptoms resolve.  She was encouraged to see her primary care provider in [facility] if not improving with these medications over the next few days.  She was advised to return to the emergency department for symptoms of change or worsen if she is unable to schedule an appointment.  Medications - No data to display  ED Discharge Orders         Ordered    albuterol (PROVENTIL HFA;VENTOLIN HFA) 108 (90 Base) MCG/ACT inhaler  Every 6 hours PRN     08/22/18 1145    Ipratropium-Albuterol (COMBIVENT)  20-100 MCG/ACT AERS respimat  Every 6 hours PRN     08/22/18 1145    azithromycin (ZITHROMAX) 250 MG tablet     08/22/18 1145    guaiFENesin-codeine (ROBITUSSIN AC) 100-10 MG/5ML syrup  3 times daily PRN     08/22/18 1145           Pertinent labs & imaging results that were available during my care of the patient were reviewed by me and considered in my medical decision making (see chart for details).    If controlled substance prescribed during this visit, 12 month history viewed on the [place] prior to issuing an initial prescription for Schedule II or III opiod. ____________________________________________   FINAL CLINICAL IMPRESSION(S) / ED DIAGNOSES  Final diagnoses:  Acute bronchitis, unspecified organism    Note:  This document was prepared using Dragon voice recognition software and may include unintentional dictation errors.    Victorino Dike, FNP 08/22/18 1157    Lavonia Drafts, MD 08/22/18 [phone]

## 2019-01-13 ENCOUNTER — Other Ambulatory Visit: Payer: Self-pay | Admitting: Family Medicine

## 2019-01-13 DIAGNOSIS — I10 Essential (primary) hypertension: Secondary | ICD-10-CM

## 2019-01-13 DIAGNOSIS — E119 Type 2 diabetes mellitus without complications: Secondary | ICD-10-CM

## 2019-03-06 ENCOUNTER — Emergency Department
Admission: EM | Admit: 2019-03-06 | Discharge: 2019-03-06 | Disposition: A | Discharge status: Home / Self Care | Payer: Medicaid Other | Attending: Emergency Medicine | Admitting: Emergency Medicine

## 2019-03-06 ENCOUNTER — Encounter: Payer: Self-pay | Admitting: Emergency Medicine

## 2019-03-06 ENCOUNTER — Other Ambulatory Visit: Payer: Self-pay

## 2019-03-06 DIAGNOSIS — Z7984 Long term (current) use of oral hypoglycemic drugs: Secondary | ICD-10-CM | POA: Insufficient documentation

## 2019-03-06 DIAGNOSIS — H9201 Otalgia, right ear: Secondary | ICD-10-CM | POA: Insufficient documentation

## 2019-03-06 DIAGNOSIS — E119 Type 2 diabetes mellitus without complications: Secondary | ICD-10-CM | POA: Insufficient documentation

## 2019-03-06 DIAGNOSIS — Z79899 Other long term (current) drug therapy: Secondary | ICD-10-CM | POA: Insufficient documentation

## 2019-03-06 DIAGNOSIS — J45909 Unspecified asthma, uncomplicated: Secondary | ICD-10-CM | POA: Insufficient documentation

## 2019-03-06 DIAGNOSIS — Z7982 Long term (current) use of aspirin: Secondary | ICD-10-CM | POA: Insufficient documentation

## 2019-03-06 DIAGNOSIS — I10 Essential (primary) hypertension: Secondary | ICD-10-CM | POA: Insufficient documentation

## 2019-03-06 MED ORDER — PSEUDOEPHEDRINE HCL ER 120 MG PO TB12: 120.0000 mg | ORAL_TABLET | Freq: Two times a day (BID) | ORAL | 2 refills | Status: DC | PRN

## 2019-03-06 MED ORDER — AZITHROMYCIN 250 MG PO TABS: ORAL_TABLET | ORAL | 0 refills | Status: AC

## 2019-03-06 NOTE — ED Notes (Signed)
Pt c/o right ear pain x3 days. Pt reports having hx of ear aches and fluid in her ears, pt described hearing a "roaring" sound in her right ear at this time.

## 2019-03-06 NOTE — ED Triage Notes (Signed)
R ear ache x3 days

## 2019-03-06 NOTE — ED Provider Notes (Signed)
[facility] Emergency Department Provider Note   ____________________________________________   First MD Initiated Contact with Patient 03/06/19 [phone]     (approximate)  I have reviewed the triage vital signs and the nursing notes.   HISTORY  Chief Complaint Otalgia    HPI Cheryl Roberts is a 48 y.o. female patient presents with 3 days of right ear pain.  Patient denies hearing loss.  Patient denies vertigo.  Describes her pain as "pressure".  Patient denies vertigo.  No palliative measure for complaint.         Past Medical History:  Diagnosis Date  . Asthma   . Cervical radiculopathy   . Chronic sinusitis   . Hypertension     Patient Active Problem List   Diagnosis Date Noted  . Adhesive capsulitis of left shoulder 10/15/2017  . Plantar fasciitis, right 10/15/2017  . Chronic pain of left upper extremity 09/19/2017  . Arthralgia of right temporomandibular joint 06/11/2017  . Pigmented skin lesion 06/11/2017  . 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 Spinal stenosis in cervical region 01/17/2017  . Chronic low back pain without sciatica 06/22/2016  . Strain of left trapezius muscle 06/22/2016  . Allergic rhinitis due to allergen 03/26/2016  . Hyperlipidemia associated with type 2 diabetes mellitus ([place]) 03/15/2016  . Hypertension 02/09/2016  . Type 2 diabetes mellitus with other specified complication ([place]) 96/78/9381  . DJD (degenerative joint disease) of cervical spine 02/09/2016  . Obesity (BMI 30.0-34.9) 02/09/2016  . Asthma, mild intermittent 02/09/2016  . Sialoadenitis 02/28/2003    History reviewed. No pertinent surgical history.  Prior to Admission medications   Medication Sig Start Date End Date Taking? Authorizing Provider  albuterol (PROVENTIL HFA;VENTOLIN HFA) 108 (90 Base) MCG/ACT inhaler Inhale 2 puffs into the lungs every 6 (six) hours as needed for wheezing or shortness of breath. 08/22/18   Triplett,  Johnette Abraham B, FNP  aspirin 81 MG tablet Take 1 tablet (81 mg total) by mouth daily. 03/15/16   Karamalegos, Devonne Doughty, DO  atorvastatin (LIPITOR) 40 MG tablet Take 1 tablet (40 mg total) by mouth daily. 03/17/17   Karamalegos, Devonne Doughty, DO  azithromycin (ZITHROMAX Z-PAK) 250 MG tablet Take 2 tablets (500 mg) on  Day 1,  followed by 1 tablet (250 mg) once daily on Days 2 through 5. 03/06/19 03/11/19  Sable Feil, PA-C  azithromycin (ZITHROMAX) 250 MG tablet 2 tablets today, then 1 tablet for the next 4 days. 08/22/18   Triplett, Cari B, FNP  Blood Glucose Monitoring Suppl (TRUE METRIX METER) w/Device KIT 1 each by Does not apply route as directed. 05/29/16   Karamalegos, Devonne Doughty, DO  brompheniramine-pseudoephedrine-DM 30-2-10 MG/5ML syrup Take 5 mLs by mouth 4 (four) times daily as needed. 08/19/18   Sable Feil, PA-C  cyclobenzaprine (FLEXERIL) 10 MG tablet Take 0.5-1 tablets (5-10 mg total) by mouth 3 (three) times daily as needed for muscle spasms. 02/27/18   Karamalegos, Devonne Doughty, DO  fluticasone (FLONASE) 50 MCG/ACT nasal spray Place 1 spray into both nostrils daily. For 2 to 4 weeks then as needed. 03/26/16   Karamalegos, Devonne Doughty, DO  glucose blood (TRUE METRIX BLOOD GLUCOSE TEST) test strip Check blood sugar twice daily. Dx E11.9 05/29/16   Olin Hauser, DO  guaiFENesin-codeine (ROBITUSSIN AC) 100-10 MG/5ML syrup Take 5 mLs by mouth 3 (three) times daily as needed for cough. 08/22/18   Triplett, Cari B, FNP  hydrochlorothiazide (HYDRODIURIL) 25 MG tablet TAKE  ONE TABLET BY MOUTH EVERY DAY 01/24/18   Karamalegos, Devonne Doughty, DO  Ipratropium-Albuterol (COMBIVENT) 20-100 MCG/ACT AERS respimat Inhale 1 puff into the lungs every 6 (six) hours as needed for wheezing or shortness of breath. 08/22/18   Triplett, Johnette Abraham B, FNP  LANCETS ULTRA FINE MISC 1 each by Does not apply route 2 (two) times daily. Check blood sugar twice daily. Dx: E11.9 05/29/16   Olin Hauser, DO   lisinopril (PRINIVIL,ZESTRIL) 10 MG tablet TAKE ONE TABLET BY MOUTH EVERY DAY 01/24/18   Karamalegos, Devonne Doughty, DO  loratadine (CLARITIN) 10 MG tablet Take 1 tablet (10 mg total) by mouth daily. 03/26/16   Karamalegos, Devonne Doughty, DO  metFORMIN (GLUCOPHAGE-XR) 750 MG 24 hr tablet TAKE ONE TABLET BY MOUTH EVERY DAY WITH BREAKFAST 09/10/17   Karamalegos, Devonne Doughty, DO  pseudoephedrine (SUDAFED) 120 MG 12 hr tablet Take 1 tablet (120 mg total) by mouth 2 (two) times daily as needed for congestion. 03/06/19 03/05/20  Sable Feil, PA-C    Allergies Amoxicillin  Family History  Problem Relation Age of Onset  . Hypertension Mother   . Hyperthyroidism Mother   . Gout Mother   . Diabetes Mother   . Arthritis Mother   . Cancer Father     Social History Social History   Tobacco Use  . Smoking status: Never Smoker  . Smokeless tobacco: Never Used  Substance Use Topics  . Alcohol use: Yes    Comment: occas wine or beer  . Drug use: No    Review of Systems  Constitutional: No fever/chills Eyes: No visual changes. ENT: No sore throat. Cardiovascular: Denies chest pain. Respiratory: Denies shortness of breath. Gastrointestinal: No abdominal pain.  No nausea, no vomiting.  No diarrhea.  No constipation. Genitourinary: Negative for dysuria. Musculoskeletal: Negative for back pain. Skin: Negative for rash. Neurological: Negative for headaches, focal weakness or numbness. Endocrine:  Diabetes, hyperlipidemia, and hypertension.  Hematological/Lymphatic:  Allergic/Immunilogical: Amoxicillin ____________________________________________   PHYSICAL EXAM:  VITAL SIGNS: ED Triage Vitals  Enc Vitals Group     BP 03/06/19 0904 (!) 141/72     Pulse Rate 03/06/19 0904 71     Resp 03/06/19 0904 18     Temp 03/06/19 0904 98.2 F (36.8 C)     Temp Source 03/06/19 0904 Oral     SpO2 03/06/19 0904 100 %     Weight 03/06/19 0907 124 lb (56.2 kg)     Height 03/06/19 0907 '4\' 9"'  (1.448 m)      Head Circumference --      Peak Flow --      Pain Score 03/06/19 0907 1     Pain Loc --      Pain Edu? --      Excl. in [place]? --    Constitutional: Alert and oriented. Well appearing and in no acute distress. EARS: Edematous and erythematous right TM. Mouth/Throat: Mucous membranes are moist.  Oropharynx non-erythematous. Hematological/Lymphatic/Immunilogical: No cervical lymphadenopathy. Cardiovascular: Normal rate, regular rhythm. Grossly normal heart sounds.  Good peripheral circulation. Respiratory: Normal respiratory effort.  No retractions. Lungs CTAB. Neurologic:  Normal speech and language. No gross focal neurologic deficits are appreciated. No gait instability. Skin:  Skin is warm, dry and intact. No rash noted.   ____________________________________________   LABS (all labs ordered are listed, but only abnormal results are displayed)  Labs Reviewed - No data to display ____________________________________________  EKG   ____________________________________________  RADIOLOGY  ED MD interpretation:    Official  radiology report(s): No results found.  ____________________________________________   PROCEDURES  Procedure(s) performed (including Critical Care):  Procedures   ____________________________________________   INITIAL IMPRESSION / ASSESSMENT AND PLAN / ED COURSE  As part of my medical decision making, I reviewed the following data within the [place] was evaluated in Emergency Department on 03/06/2019 for the symptoms described in the history of present illness. She was evaluated in the context of the global COVID-19 pandemic, which necessitated consideration that the patient might be at risk for infection with the SARS-CoV-2 virus that causes COVID-19. Institutional protocols and algorithms that pertain to the evaluation of patients at risk for COVID-19 are in a state of rapid change based on  information released by regulatory bodies including the CDC and federal and state organizations. These policies and algorithms were followed during the patient's care in the ED.   Patient presents 3 days of right ear pain.  His exam is consistent with otitis media.  Patient given discharge care instruction.  Patient advised take medication as directed.  Patient advised follow-up PCP if no improvement in 1 week.      ____________________________________________   FINAL CLINICAL IMPRESSION(S) / ED DIAGNOSES  Final diagnoses:  Otalgia of right ear     ED Discharge Orders         Ordered    azithromycin (ZITHROMAX Z-PAK) 250 MG tablet     03/06/19 0921    pseudoephedrine (SUDAFED) 120 MG 12 hr tablet  2 times daily PRN     03/06/19 8347           Note:  This document was prepared using Dragon voice recognition software and may include unintentional dictation errors.    Sable Feil, PA-C 03/06/19 5830    Vanessa [place], MD 03/06/19 [phone]

## 2019-06-08 ENCOUNTER — Emergency Department
Admission: EM | Admit: 2019-06-08 | Discharge: 2019-06-08 | Disposition: A | Discharge status: Home / Self Care | Payer: Medicaid Other | Attending: Emergency Medicine | Admitting: Emergency Medicine

## 2019-06-08 ENCOUNTER — Encounter: Payer: Self-pay | Admitting: Emergency Medicine

## 2019-06-08 ENCOUNTER — Other Ambulatory Visit: Payer: Self-pay

## 2019-06-08 DIAGNOSIS — M5412 Radiculopathy, cervical region: Secondary | ICD-10-CM | POA: Insufficient documentation

## 2019-06-08 DIAGNOSIS — Z7984 Long term (current) use of oral hypoglycemic drugs: Secondary | ICD-10-CM | POA: Insufficient documentation

## 2019-06-08 DIAGNOSIS — E119 Type 2 diabetes mellitus without complications: Secondary | ICD-10-CM | POA: Insufficient documentation

## 2019-06-08 DIAGNOSIS — Z79899 Other long term (current) drug therapy: Secondary | ICD-10-CM | POA: Insufficient documentation

## 2019-06-08 DIAGNOSIS — J45909 Unspecified asthma, uncomplicated: Secondary | ICD-10-CM | POA: Insufficient documentation

## 2019-06-08 DIAGNOSIS — Z7982 Long term (current) use of aspirin: Secondary | ICD-10-CM | POA: Insufficient documentation

## 2019-06-08 DIAGNOSIS — I10 Essential (primary) hypertension: Secondary | ICD-10-CM | POA: Insufficient documentation

## 2019-06-08 LAB — GLUCOSE, CAPILLARY: Glucose-Capillary: 190 mg/dL — ABNORMAL HIGH (ref 70–99)

## 2019-06-08 MED ORDER — PREDNISONE 20 MG PO TABS: ORAL_TABLET | ORAL | 0 refills | Status: DC

## 2019-06-08 MED ORDER — DEXAMETHASONE SODIUM PHOSPHATE 10 MG/ML IJ SOLN
10.0000 mg | Freq: Once | INTRAMUSCULAR | Status: AC
  Administered 2019-06-08: 10 mg via INTRAMUSCULAR
  Filled 2019-06-08: qty 1

## 2019-06-08 NOTE — ED Notes (Signed)
See triage note  Having intermittent pain and numbness to left shoulder  States had surgery about 5 months ago  Denies any recent injury

## 2019-06-08 NOTE — Discharge Instructions (Signed)
Follow-up with your primary care provider or your doctor at [facility].  Begin taking prednisone tomorrow 1 tablet each day with food until finished.  As we already discussed be very careful of the amount of starches and sugars that you are eating at this time as the medication will elevate your blood sugar.  You may use ice or heat to your neck and shoulder as needed for discomfort.

## 2019-06-08 NOTE — ED Provider Notes (Signed)
[facility] Emergency Department Provider Note  ____________________________________________   First MD Initiated Contact with Patient 06/08/19 1135     (approximate)  I have reviewed the triage vital signs and the nursing notes.   HISTORY  Chief Complaint Shoulder Pain   HPI Cheryl Roberts is a 48 y.o. female presents to the ED with complaint of left shoulder/arm pain.  Patient states that she had surgery on her left shoulder 5 months ago and is currently still going through physical therapy for a frozen shoulder.  She states that intermittently she has pain with some numbness going down into her left hand.  She also has had a history of a cervical discectomy and states that prior to her surgery she had a lot of radiculopathy in the left arm.  She has taken over-the-counter medication without any relief.  She states that she has an appointment with her surgeon in January but goes twice a week for physical therapy.  She denies any recent injury.  She denies any shortness of breath, nausea, diaphoresis or cardiac history.  She rates her pain as 6 out of 10.     Past Medical History:  Diagnosis Date  . Asthma   . Cervical radiculopathy   . Chronic sinusitis   . Hypertension     Patient Active Problem List   Diagnosis Date Noted  . Adhesive capsulitis of left shoulder 10/15/2017  . Plantar fasciitis, right 10/15/2017  . Chronic pain of left upper extremity 09/19/2017  . Arthralgia of right temporomandibular joint 06/11/2017  . Pigmented skin lesion 06/11/2017  . 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 Spinal stenosis in cervical region 01/17/2017  . Chronic low back pain without sciatica 06/22/2016  . Strain of left trapezius muscle 06/22/2016  . Allergic rhinitis due to allergen 03/26/2016  . Hyperlipidemia associated with type 2 diabetes mellitus ([place]) 03/15/2016  . Hypertension 02/09/2016  . Type 2 diabetes mellitus with other  specified complication ([place]) 73/71/0626  . DJD (degenerative joint disease) of cervical spine 02/09/2016  . Obesity (BMI 30.0-34.9) 02/09/2016  . Asthma, mild intermittent 02/09/2016  . Sialoadenitis 02/28/2003    History reviewed. No pertinent surgical history.  Prior to Admission medications   Medication Sig Start Date End Date Taking? Authorizing Provider  albuterol (PROVENTIL HFA;VENTOLIN HFA) 108 (90 Base) MCG/ACT inhaler Inhale 2 puffs into the lungs every 6 (six) hours as needed for wheezing or shortness of breath. 08/22/18   Triplett, Johnette Abraham B, FNP  aspirin 81 MG tablet Take 1 tablet (81 mg total) by mouth daily. 03/15/16   Karamalegos, Devonne Doughty, DO  atorvastatin (LIPITOR) 40 MG tablet Take 1 tablet (40 mg total) by mouth daily. 03/17/17   Karamalegos, Devonne Doughty, DO  Blood Glucose Monitoring Suppl (TRUE METRIX METER) w/Device KIT 1 each by Does not apply route as directed. 05/29/16   Karamalegos, Devonne Doughty, DO  brompheniramine-pseudoephedrine-DM 30-2-10 MG/5ML syrup Take 5 mLs by mouth 4 (four) times daily as needed. 08/19/18   Sable Feil, PA-C  fluticasone (FLONASE) 50 MCG/ACT nasal spray Place 1 spray into both nostrils daily. For 2 to 4 weeks then as needed. 03/26/16   Karamalegos, Devonne Doughty, DO  glucose blood (TRUE METRIX BLOOD GLUCOSE TEST) test strip Check blood sugar twice daily. Dx E11.9 05/29/16   Olin Hauser, DO  hydrochlorothiazide (HYDRODIURIL) 25 MG tablet TAKE ONE TABLET BY MOUTH EVERY DAY 01/24/18   Karamalegos, Devonne Doughty, DO  Ipratropium-Albuterol (COMBIVENT) 20-100 MCG/ACT  AERS respimat Inhale 1 puff into the lungs every 6 (six) hours as needed for wheezing or shortness of breath. 08/22/18   Triplett, Johnette Abraham B, FNP  LANCETS ULTRA FINE MISC 1 each by Does not apply route 2 (two) times daily. Check blood sugar twice daily. Dx: E11.9 05/29/16   Olin Hauser, DO  lisinopril (PRINIVIL,ZESTRIL) 10 MG tablet TAKE ONE TABLET BY MOUTH EVERY DAY  01/24/18   Karamalegos, Devonne Doughty, DO  loratadine (CLARITIN) 10 MG tablet Take 1 tablet (10 mg total) by mouth daily. 03/26/16   Karamalegos, Devonne Doughty, DO  metFORMIN (GLUCOPHAGE-XR) 750 MG 24 hr tablet TAKE ONE TABLET BY MOUTH EVERY DAY WITH BREAKFAST 09/10/17   Karamalegos, Devonne Doughty, DO  predniSONE (DELTASONE) 20 MG tablet Take 1 tablet daily for 5 days. 06/08/19   Johnn Hai, PA-C  pseudoephedrine (SUDAFED) 120 MG 12 hr tablet Take 1 tablet (120 mg total) by mouth 2 (two) times daily as needed for congestion. 03/06/19 03/05/20  Sable Feil, PA-C    Allergies Amoxicillin  Family History  Problem Relation Age of Onset  . Hypertension Mother   . Hyperthyroidism Mother   . Gout Mother   . Diabetes Mother   . Arthritis Mother   . Cancer Father     Social History Social History   Tobacco Use  . Smoking status: Never Smoker  . Smokeless tobacco: Never Used  Substance Use Topics  . Alcohol use: Yes    Comment: occas wine or beer  . Drug use: No    Review of Systems Constitutional: No fever/chills Eyes: No visual changes. ENT: No sore throat. Cardiovascular: Denies chest pain. Respiratory: Denies shortness of breath. Gastrointestinal:   No nausea, no vomiting.  Genitourinary: Negative for dysuria. Musculoskeletal: Left shoulder and left upper arm pain with radiculopathy. Skin: Negative for rash. Neurological: Negative for headaches, focal weakness or numbness. ____________________________________________   PHYSICAL EXAM:  VITAL SIGNS: ED Triage Vitals  Enc Vitals Group     BP 06/08/19 1116 (!) 143/78     Pulse Rate 06/08/19 1116 75     Resp 06/08/19 1116 16     Temp 06/08/19 1116 98.7 F (37.1 C)     Temp Source 06/08/19 1116 Oral     SpO2 06/08/19 1116 99 %     Weight 06/08/19 1112 130 lb (59 kg)     Height 06/08/19 1112 '4\' 9"'  (1.448 m)     Head Circumference --      Peak Flow --      Pain Score 06/08/19 1112 6     Pain Loc --      Pain Edu? --       Excl. in [place]? --    Constitutional: Alert and oriented. Well appearing and in no acute distress. Eyes: Conjunctivae are normal.  Head: Atraumatic. Neck: No stridor.  No point tenderness on palpation of cervical spine posteriorly.  There is however left cervical paravertebral muscle tenderness into the left trapezius.  No soft tissue edema or discoloration is noted. Hematological/Lymphatic/Immunilogical: No cervical lymphadenopathy. Cardiovascular: Normal rate, regular rhythm. Grossly normal heart sounds.  Good peripheral circulation. Respiratory: Normal respiratory effort.  No retractions. Lungs CTAB. Gastrointestinal: Soft and nontender. No distention.  Musculoskeletal: Moderate tenderness on palpation of the left trapezius muscle.  No evidence of abrasion, soft tissue injury or deformity.  Range of motion of the left upper extremity is without restriction but increases pain.  Good muscle strength bilaterally with grip.  Motor sensory  function intact bilaterally.  Capillary refills less than 3 seconds.  Skin is intact. Neurologic:  Normal speech and language. No gross focal neurologic deficits are appreciated. No gait instability. Skin:  Skin is warm, dry and intact.  Psychiatric: Mood and affect are normal. Speech and behavior are normal.  ____________________________________________   LABS (all labs ordered are listed, but only abnormal results are displayed)  Labs Reviewed  GLUCOSE, CAPILLARY - Abnormal; Notable for the following components:      Result Value   Glucose-Capillary 190 (*)    All other components within normal limits  CBG MONITORING, ED    PROCEDURES  Procedure(s) performed (including Critical Care):  Procedures   ____________________________________________   INITIAL IMPRESSION / ASSESSMENT AND PLAN / ED COURSE  As part of my medical decision making, I reviewed the following data within the electronic MEDICAL RECORD NUMBER Notes from prior ED visits and [place]  Controlled Substance Database  Cheryl Roberts was evaluated in Emergency Department on 06/08/2019 for the symptoms described in the history of present illness. She was evaluated in the context of the global COVID-19 pandemic, which necessitated consideration that the patient might be at risk for infection with the SARS-CoV-2 virus that causes COVID-19. Institutional protocols and algorithms that pertain to the evaluation of patients at risk for COVID-19 are in a state of rapid change based on information released by regulatory bodies including the CDC and federal and state organizations. These policies and algorithms were followed during the patient's care in the ED.  48 year old female presents to the ED with complaint of left shoulder pain intermittently.  Patient states she had left shoulder surgery 5 months ago and currently is in physical therapy.  She states she had a frozen shoulder and recently the pain keeps her awake at night.  Patient is a type II diabetic but after discussing steroids she agrees to carefully watch what she is eating and taking her medication as prescribed.  Patient was given Decadron 10 mg IM while in the ED.  She is to start with prednisone 20 mg daily for the next 5 days.  She was also encouraged to use ice or heat to her muscles as needed for discomfort.  Patient has an appointment with her surgeon in January.  She is aware that she may return to the emergency department over the holiday weekend if any worsening of her symptoms or urgent concerns.  ____________________________________________   FINAL CLINICAL IMPRESSION(S) / ED DIAGNOSES  Final diagnoses:  Cervical radiculopathy     ED Discharge Orders         Ordered    predniSONE (DELTASONE) 20 MG tablet     06/08/19 1238           Note:  This document was prepared using Dragon voice recognition software and may include unintentional dictation errors.    Johnn Hai, PA-C 06/08/19 1332      Earleen Newport, MD 06/08/19 [phone]

## 2019-06-08 NOTE — ED Triage Notes (Signed)
Pt reports had surgery on her left shoulder 5 months ago and went through PT but is having intermittent pains and numbness in her left shoulder down to her hand.

## 2019-09-23 ENCOUNTER — Other Ambulatory Visit: Payer: Self-pay

## 2019-09-23 ENCOUNTER — Emergency Department
Admission: EM | Admit: 2019-09-23 | Discharge: 2019-09-23 | Disposition: A | Discharge status: Home / Self Care | Payer: Medicaid Other | Attending: Emergency Medicine | Admitting: Emergency Medicine

## 2019-09-23 DIAGNOSIS — Z7982 Long term (current) use of aspirin: Secondary | ICD-10-CM | POA: Insufficient documentation

## 2019-09-23 DIAGNOSIS — I10 Essential (primary) hypertension: Secondary | ICD-10-CM | POA: Insufficient documentation

## 2019-09-23 DIAGNOSIS — Z79899 Other long term (current) drug therapy: Secondary | ICD-10-CM | POA: Insufficient documentation

## 2019-09-23 DIAGNOSIS — E119 Type 2 diabetes mellitus without complications: Secondary | ICD-10-CM | POA: Insufficient documentation

## 2019-09-23 DIAGNOSIS — Z7984 Long term (current) use of oral hypoglycemic drugs: Secondary | ICD-10-CM | POA: Insufficient documentation

## 2019-09-23 DIAGNOSIS — H6121 Impacted cerumen, right ear: Secondary | ICD-10-CM | POA: Insufficient documentation

## 2019-09-23 DIAGNOSIS — J45909 Unspecified asthma, uncomplicated: Secondary | ICD-10-CM | POA: Insufficient documentation

## 2019-09-23 MED ORDER — NEOMYCIN-POLYMYXIN-HC 3.5-10000-1 OT SOLN: 3.0000 [drp] | Freq: Four times a day (QID) | OTIC | 0 refills | Status: AC

## 2019-09-23 MED ORDER — CARBAMIDE PEROXIDE 6.5 % OT SOLN
5.0000 [drp] | Freq: Once | OTIC | Status: AC
  Administered 2019-09-23: 5 [drp] via OTIC
  Filled 2019-09-23: qty 15

## 2019-09-23 NOTE — ED Provider Notes (Signed)
[facility] Emergency Department Provider Note  ____________________________________________   First MD Initiated Contact with Patient 09/23/19 1408     (approximate)  I have reviewed the triage vital signs and the nursing notes.   HISTORY  Chief Complaint Otalgia   HPI Cheryl Roberts is a 49 y.o. female presents to the ED with complaint of right ear pain for 1 week.  Patient denies any fever, chills, nausea or vomiting.  There has been no nasal congestion or sneezing.  Patient states that she frequently has problems with her ears.  She rates her pain as 6 out of 10.      Past Medical History:  Diagnosis Date  . Asthma   . Cervical radiculopathy   . Chronic sinusitis   . Hypertension     Patient Active Problem List   Diagnosis Date Noted  . Adhesive capsulitis of left shoulder 10/15/2017  . Plantar fasciitis, right 10/15/2017  . Chronic pain of left upper extremity 09/19/2017  . Arthralgia of right temporomandibular joint 06/11/2017  . Pigmented skin lesion 06/11/2017  . 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 Spinal stenosis in cervical region 01/17/2017  . Chronic low back pain without sciatica 06/22/2016  . Strain of left trapezius muscle 06/22/2016  . Allergic rhinitis due to allergen 03/26/2016  . Hyperlipidemia associated with type 2 diabetes mellitus ([place]) 03/15/2016  . Hypertension 02/09/2016  . Type 2 diabetes mellitus with other specified complication ([place]) 12/19/1973  . DJD (degenerative joint disease) of cervical spine 02/09/2016  . Obesity (BMI 30.0-34.9) 02/09/2016  . Asthma, mild intermittent 02/09/2016  . Sialoadenitis 02/28/2003    History reviewed. No pertinent surgical history.  Prior to Admission medications   Medication Sig Start Date End Date Taking? Authorizing Provider  albuterol (PROVENTIL HFA;VENTOLIN HFA) 108 (90 Base) MCG/ACT inhaler Inhale 2 puffs into the lungs every 6 (six) hours as needed  for wheezing or shortness of breath. 08/22/18   Triplett, Johnette Abraham B, FNP  aspirin 81 MG tablet Take 1 tablet (81 mg total) by mouth daily. 03/15/16   Karamalegos, Devonne Doughty, DO  atorvastatin (LIPITOR) 40 MG tablet Take 1 tablet (40 mg total) by mouth daily. 03/17/17   Karamalegos, Devonne Doughty, DO  Blood Glucose Monitoring Suppl (TRUE METRIX METER) w/Device KIT 1 each by Does not apply route as directed. 05/29/16   Karamalegos, Devonne Doughty, DO  brompheniramine-pseudoephedrine-DM 30-2-10 MG/5ML syrup Take 5 mLs by mouth 4 (four) times daily as needed. 08/19/18   Sable Feil, PA-C  fluticasone (FLONASE) 50 MCG/ACT nasal spray Place 1 spray into both nostrils daily. For 2 to 4 weeks then as needed. 03/26/16   Karamalegos, Devonne Doughty, DO  glucose blood (TRUE METRIX BLOOD GLUCOSE TEST) test strip Check blood sugar twice daily. Dx E11.9 05/29/16   Olin Hauser, DO  hydrochlorothiazide (HYDRODIURIL) 25 MG tablet TAKE ONE TABLET BY MOUTH EVERY DAY 01/24/18   Karamalegos, Devonne Doughty, DO  Ipratropium-Albuterol (COMBIVENT) 20-100 MCG/ACT AERS respimat Inhale 1 puff into the lungs every 6 (six) hours as needed for wheezing or shortness of breath. 08/22/18   Triplett, Johnette Abraham B, FNP  LANCETS ULTRA FINE MISC 1 each by Does not apply route 2 (two) times daily. Check blood sugar twice daily. Dx: E11.9 05/29/16   Olin Hauser, DO  lisinopril (PRINIVIL,ZESTRIL) 10 MG tablet TAKE ONE TABLET BY MOUTH EVERY DAY 01/24/18   Parks Ranger, Devonne Doughty, DO  loratadine (CLARITIN) 10 MG tablet Take 1 tablet (10  mg total) by mouth daily. 03/26/16   Karamalegos, Devonne Doughty, DO  metFORMIN (GLUCOPHAGE-XR) 750 MG 24 hr tablet TAKE ONE TABLET BY MOUTH EVERY DAY WITH BREAKFAST 09/10/17   Karamalegos, Devonne Doughty, DO  neomycin-polymyxin-hydrocortisone (CORTISPORIN) OTIC solution Place 3 drops into the right ear 4 (four) times daily for 10 days. 09/23/19 10/03/19  Johnn Hai, PA-C  predniSONE (DELTASONE) 20 MG tablet  Take 1 tablet daily for 5 days. 06/08/19   Johnn Hai, PA-C  pseudoephedrine (SUDAFED) 120 MG 12 hr tablet Take 1 tablet (120 mg total) by mouth 2 (two) times daily as needed for congestion. 03/06/19 03/05/20  Sable Feil, PA-C    Allergies Amoxicillin  Family History  Problem Relation Age of Onset  . Hypertension Mother   . Hyperthyroidism Mother   . Gout Mother   . Diabetes Mother   . Arthritis Mother   . Cancer Father     Social History Social History   Tobacco Use  . Smoking status: Never Smoker  . Smokeless tobacco: Never Used  Substance Use Topics  . Alcohol use: Yes    Comment: occas wine or beer  . Drug use: No    Review of Systems Constitutional: No fever/chills Eyes: No visual changes. ENT: No sore throat.  Positive for right ear pain Cardiovascular: Denies chest pain. Respiratory: Denies shortness of breath. Gastrointestinal:   No nausea, no vomiting.  Musculoskeletal: Negative for muscle aches. Skin: Negative for rash. Neurological: Negative for headaches, focal weakness or numbness. ___________________________________________   PHYSICAL EXAM:  VITAL SIGNS: ED Triage Vitals  Enc Vitals Group     BP 09/23/19 1337 (!) 141/83     Pulse Rate 09/23/19 1337 80     Resp 09/23/19 1337 16     Temp 09/23/19 1337 98.2 F (36.8 C)     Temp Source 09/23/19 1337 Oral     SpO2 09/23/19 1337 100 %     Weight 09/23/19 1338 131 lb (59.4 kg)     Height 09/23/19 1338 '4\' 10"'  (1.473 m)     Head Circumference --      Peak Flow --      Pain Score 09/23/19 1337 6     Pain Loc --      Pain Edu? --      Excl. in [place]? --     Constitutional: Alert and oriented. Well appearing and in no acute distress. Eyes: Conjunctivae are normal. PERRL. EOMI. Head: Atraumatic. Nose: No congestion/rhinnorhea. Left EAC with minimal cerumen and TM is dull but no erythema or injection was noted.  Right EAC is obstructed with cerumen and TM is not visible.  Neck: No stridor.    Cardiovascular: Normal rate, regular rhythm. Grossly normal heart sounds.  Good peripheral circulation. Respiratory: Normal respiratory effort.  No retractions. Lungs CTAB. Musculoskeletal: Moves upper and lower extremities with any difficulty.  Normal gait was noted. Neurologic:  Normal speech and language. No gross focal neurologic deficits are appreciated. No gait instability. Skin:  Skin is warm, dry and intact. No rash noted. Psychiatric: Mood and affect are normal. Speech and behavior are normal.  ____________________________________________   LABS (all labs ordered are listed, but only abnormal results are displayed)  Labs Reviewed - No data to display  PROCEDURES  Procedure(s) performed (including Critical Care):  Procedures   ____________________________________________   INITIAL IMPRESSION / ASSESSMENT AND PLAN / ED COURSE  As part of my medical decision making, I reviewed the following data within the electronic  MEDICAL RECORD NUMBER Notes from prior ED visits and [place] Controlled Substance Database  49 year old female with right ear pain for 1 week is evaluated in the ED and a cerumen impaction was noted.  Debrox and lavage removed most of the cerumen however there is some white exudate noted.  Patient placed on Cortisporin otic suspension with instructions to follow-up with her ENT if any continued problems.  She is encouraged to take Tylenol or ibuprofen if needed.  She will return to the emergency department if any severe worsening of her symptoms such as fever or chills.  ____________________________________________   FINAL CLINICAL IMPRESSION(S) / ED DIAGNOSES  Final diagnoses:  Impacted cerumen of right ear     ED Discharge Orders         Ordered    neomycin-polymyxin-hydrocortisone (CORTISPORIN) OTIC solution  4 times daily     09/23/19 1647           Note:  This document was prepared using Dragon voice recognition software and may include unintentional  dictation errors.    Johnn Hai, PA-C 09/23/19 1726    Carrie Mew, MD 09/24/19 [phone]

## 2019-09-23 NOTE — ED Triage Notes (Signed)
Reports right ear pain X 1 week, worsening today. Pt alert and oriented X4, cooperative, RR even and unlabored, color WNL. Pt in NAD.

## 2019-09-23 NOTE — Discharge Instructions (Signed)
Follow-up with your doctor at [facility] if any continued problems with your ear.  A prescription for eardrops was sent to your pharmacy.  Continue with these as directed.  You may take Tylenol or ibuprofen as needed for ear pain.  If any severe worsening of your symptoms such as fever, chills, nausea or vomiting return to the emergency department for reevaluation.

## 2019-09-23 NOTE — ED Notes (Signed)
Pt's right ear irrigated with NS. Pt tolerated well. Wax and white mucous flushed from ear

## 2019-09-23 NOTE — ED Notes (Signed)
See triage note Presents with right ear pain for 1 week  States pain is worse today  afebrile on arrival

## 2020-02-22 ENCOUNTER — Other Ambulatory Visit: Payer: Self-pay

## 2020-02-22 ENCOUNTER — Emergency Department
Admission: EM | Admit: 2020-02-22 | Discharge: 2020-02-22 | Disposition: A | Discharge status: Home / Self Care | Payer: Medicaid Other | Attending: Emergency Medicine | Admitting: Emergency Medicine

## 2020-02-22 DIAGNOSIS — Z7982 Long term (current) use of aspirin: Secondary | ICD-10-CM | POA: Insufficient documentation

## 2020-02-22 DIAGNOSIS — Z79899 Other long term (current) drug therapy: Secondary | ICD-10-CM | POA: Insufficient documentation

## 2020-02-22 DIAGNOSIS — E119 Type 2 diabetes mellitus without complications: Secondary | ICD-10-CM | POA: Insufficient documentation

## 2020-02-22 DIAGNOSIS — H6121 Impacted cerumen, right ear: Secondary | ICD-10-CM | POA: Insufficient documentation

## 2020-02-22 DIAGNOSIS — H6981 Other specified disorders of Eustachian tube, right ear: Secondary | ICD-10-CM

## 2020-02-22 DIAGNOSIS — H6991 Unspecified Eustachian tube disorder, right ear: Secondary | ICD-10-CM | POA: Insufficient documentation

## 2020-02-22 DIAGNOSIS — J45909 Unspecified asthma, uncomplicated: Secondary | ICD-10-CM | POA: Insufficient documentation

## 2020-02-22 DIAGNOSIS — I10 Essential (primary) hypertension: Secondary | ICD-10-CM | POA: Insufficient documentation

## 2020-02-22 DIAGNOSIS — Z7984 Long term (current) use of oral hypoglycemic drugs: Secondary | ICD-10-CM | POA: Insufficient documentation

## 2020-02-22 DIAGNOSIS — Z7951 Long term (current) use of inhaled steroids: Secondary | ICD-10-CM | POA: Insufficient documentation

## 2020-02-22 MED ORDER — PSEUDOEPHEDRINE HCL ER 120 MG PO TB12: 120.0000 mg | ORAL_TABLET | Freq: Two times a day (BID) | ORAL | 0 refills | Status: AC | PRN

## 2020-02-22 MED ORDER — NEOMYCIN-POLYMYXIN-HC 3.5-10000-1 OT SOLN: 3.0000 [drp] | Freq: Three times a day (TID) | OTIC | 0 refills | Status: AC

## 2020-02-22 NOTE — ED Provider Notes (Signed)
[facility] Emergency Department Provider Note   ____________________________________________   First MD Initiated Contact with Patient 02/22/20 1200     (approximate)  I have reviewed the triage vital signs and the nursing notes.   HISTORY  Chief Complaint Otalgia    HPI Cheryl Roberts is a 49 y.o. female patient complain of right ear pain upon a.m. awakening.  Patient said there was some mild sore throat with sleep last night.  Patient that she attempt to move the wax using rolled up tissue paper with only minimal success.  Patient denies hearing loss.  Patient states this is a recurrent problem.  Patient is not follow ENT as directed.  Patient rates the pain as a 3/10.  Patient described pain as "sore".         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   Spinal stenosis in cervical region 01/17/2017   Chronic low back pain without sciatica 06/22/2016   Strain of left trapezius muscle 06/22/2016   Allergic rhinitis due to allergen 03/26/2016   Hyperlipidemia associated with type 2 diabetes mellitus ([place]) 03/15/2016   Hypertension 02/09/2016   Type 2 diabetes mellitus with other specified complication ([place]) 59/56/3875   DJD (degenerative joint disease) of cervical spine 02/09/2016   Obesity (BMI 30.0-34.9) 02/09/2016   Asthma, mild intermittent 02/09/2016   Sialoadenitis 02/28/2003    No past surgical history on file.  Prior to Admission medications   Medication Sig Start Date End Date Taking? Authorizing  Provider  albuterol (PROVENTIL HFA;VENTOLIN HFA) 108 (90 Base) MCG/ACT inhaler Inhale 2 puffs into the lungs every 6 (six) hours as needed for wheezing or shortness of breath. 08/22/18   Triplett, Johnette Abraham B, FNP  aspirin 81 MG tablet Take 1 tablet (81 mg total) by mouth daily. 03/15/16   Karamalegos, Devonne Doughty, DO  atorvastatin (LIPITOR) 40 MG tablet Take 1 tablet (40 mg total) by mouth daily. 03/17/17   Karamalegos, Devonne Doughty, DO  Blood Glucose Monitoring Suppl (TRUE METRIX METER) w/Device KIT 1 each by Does not apply route as directed. 05/29/16   Karamalegos, Devonne Doughty, DO  brompheniramine-pseudoephedrine-DM 30-2-10 MG/5ML syrup Take 5 mLs by mouth 4 (four) times daily as needed. 08/19/18   Sable Feil, PA-C  fluticasone (FLONASE) 50 MCG/ACT nasal spray Place 1 spray into both nostrils daily. For 2 to 4 weeks then as needed. 03/26/16   Karamalegos, Devonne Doughty, DO  glucose blood (TRUE METRIX BLOOD GLUCOSE TEST) test strip Check blood sugar twice daily. Dx E11.9 05/29/16   Olin Hauser, DO  hydrochlorothiazide (HYDRODIURIL) 25 MG tablet TAKE ONE TABLET BY MOUTH EVERY DAY 01/24/18   Karamalegos, Devonne Doughty, DO  Ipratropium-Albuterol (COMBIVENT) 20-100 MCG/ACT AERS respimat Inhale 1 puff into the lungs every 6 (six) hours as needed for wheezing or shortness of breath. 08/22/18   Triplett, Johnette Abraham B, FNP  LANCETS ULTRA FINE MISC 1 each by Does not apply route 2 (two) times daily. Check blood sugar twice daily. Dx: E11.9 05/29/16   Olin Hauser, DO  lisinopril (  PRINIVIL,ZESTRIL) 10 MG tablet TAKE ONE TABLET BY MOUTH EVERY DAY 01/24/18   Karamalegos, Devonne Doughty, DO  loratadine (CLARITIN) 10 MG tablet Take 1 tablet (10 mg total) by mouth daily. 03/26/16   Karamalegos, Devonne Doughty, DO  metFORMIN (GLUCOPHAGE-XR) 750 MG 24 hr tablet TAKE ONE TABLET BY MOUTH EVERY DAY WITH BREAKFAST 09/10/17   Karamalegos, Devonne Doughty, DO  neomycin-polymyxin-hydrocortisone (CORTISPORIN) OTIC solution Place  3 drops into the right ear 3 (three) times daily for 10 days. 02/22/20 03/03/20  Sable Feil, PA-C  predniSONE (DELTASONE) 20 MG tablet Take 1 tablet daily for 5 days. 06/08/19   Johnn Hai, PA-C  pseudoephedrine (SUDAFED) 120 MG 12 hr tablet Take 1 tablet (120 mg total) by mouth 2 (two) times daily as needed for congestion. 02/22/20 02/21/21  Sable Feil, PA-C    Allergies Amoxicillin   Family History  Problem Relation Age of Onset   Hypertension Mother    Hyperthyroidism Mother    Gout Mother    Diabetes Mother    Arthritis Mother    Cancer Father     Social History Social History   Tobacco Use   Smoking status: Never Smoker   Smokeless tobacco: Never Used  Substance Use Topics   Alcohol use: Yes    Comment: occas wine or beer   Drug use: No    Review of Systems Constitutional: No fever/chills Eyes: No visual changes. ENT: No sore throat. Cardiovascular: Denies chest pain. Respiratory: Denies shortness of breath. Gastrointestinal: No abdominal pain.  No nausea, no vomiting.  No diarrhea.  No constipation. Genitourinary: Negative for dysuria. Musculoskeletal: Negative for back pain. Skin: Negative for rash. Neurological: Negative for headaches, focal weakness or numbness. Psychiatric:  Endocrine:  Diabetes, hyperlipidemia, hypertension. Allergic/Immunilogical: Amoxil ____________________________________________   PHYSICAL EXAM:  VITAL SIGNS: ED Triage Vitals  Enc Vitals Group     BP 02/22/20 1020 140/67     Pulse Rate 02/22/20 1020 67     Resp 02/22/20 1020 16     Temp 02/22/20 1020 98.3 F (36.8 C)     Temp Source 02/22/20 1020 Oral     SpO2 02/22/20 1020 100 %     Weight 02/22/20 1021 137 lb (62.1 kg)     Height 02/22/20 1021 _0  (1.448 m)     Head Circumference --      Peak Flow --      Pain Score 02/22/20 1020 3     Pain Loc --      Pain Edu? --      Excl. in [place]? --     Constitutional: Alert and oriented. Well appearing  and in no acute distress. Nose: No congestion/rhinnorhea. EARS: Right TM not visible secondary to cerumen impaction.   Mouth/Throat: Mucous membranes are moist.  Oropharynx non-erythematous. Cardiovascular: Normal rate, regular rhythm. Grossly normal heart sounds.  Good peripheral circulation. Respiratory: Normal respiratory effort.  No retractions. Lungs CTAB. Neurologic:  Normal speech and language. No gross focal neurologic deficits are appreciated. No gait instability. Skin:  Skin is warm, dry and intact. No rash noted. Psychiatric: Mood and affect are normal. Speech and behavior are normal.  ____________________________________________   LABS (all labs ordered are listed, but only abnormal results are displayed)  Labs Reviewed - No data to display ____________________________________________  EKG   ____________________________________________  RADIOLOGY  ED MD interpretation:    Official radiology report(s): No results found.  ____________________________________________   PROCEDURES  Procedure(s) performed (including Critical Care):  Procedures  ____________________________________________   INITIAL IMPRESSION / ASSESSMENT AND PLAN / ED COURSE  As part of my medical decision making, I reviewed the following data within the [place]     Patient presents with right ear pain and decreased hearing.  Status post cerumen removal via irrigation the canal was found to be mildly erythematous and TM was found to be slightly retracted.  Patient given discharge care instructions and a prescription for Sudafed and Cortisporin.  Advised to follow-up with ENT clinic condition persists or worsens.          ____________________________________________   FINAL CLINICAL IMPRESSION(S) / ED DIAGNOSES  Final diagnoses:  Impacted cerumen of right ear  Dysfunction of right eustachian tube     ED Discharge Orders         Ordered    pseudoephedrine  (SUDAFED) 120 MG 12 hr tablet  2 times daily PRN        02/22/20 1334    neomycin-polymyxin-hydrocortisone (CORTISPORIN) OTIC solution  3 times daily        02/22/20 1334          *Please note:  SHANAUTICA FORKER was evaluated in Emergency Department on 02/22/2020 for the symptoms described in the history of present illness. She was evaluated in the context of the global COVID-19 pandemic, which necessitated consideration that the patient might be at risk for infection with the SARS-CoV-2 virus that causes COVID-19. Institutional protocols and algorithms that pertain to the evaluation of patients at risk for COVID-19 are in a state of rapid change based on information released by regulatory bodies including the CDC and federal and state organizations. These policies and algorithms were followed during the patient's care in the ED.  Some ED evaluations and interventions may be delayed as a result of limited staffing during and the pandemic.*   Note:  This document was prepared using Dragon voice recognition software and may include unintentional dictation errors.    Sable Feil, PA-C 02/22/20 1338    Naaman Plummer, MD 02/22/20 [phone]

## 2020-02-22 NOTE — ED Triage Notes (Signed)
Pt here with right ear pain that started last night. She states that she tried to clean them and also administer eardrops to her ears but the pain is still there. Pt NAD in triage.

## 2020-02-22 NOTE — Discharge Instructions (Addendum)
Follow discharge care instruction take medication as directed. °

## 2020-02-22 NOTE — ED Notes (Signed)
Pt with right ear pain, 3/10. Denies drainage.NAD

## 2020-04-19 ENCOUNTER — Other Ambulatory Visit: Payer: Self-pay

## 2020-04-19 ENCOUNTER — Encounter: Payer: Self-pay | Admitting: Emergency Medicine

## 2020-04-19 ENCOUNTER — Emergency Department
Admission: EM | Admit: 2020-04-19 | Discharge: 2020-04-19 | Disposition: A | Discharge status: Home / Self Care | Payer: BC Managed Care – PPO | Attending: Emergency Medicine | Admitting: Emergency Medicine

## 2020-04-19 DIAGNOSIS — H9203 Otalgia, bilateral: Secondary | ICD-10-CM | POA: Insufficient documentation

## 2020-04-19 DIAGNOSIS — Z7982 Long term (current) use of aspirin: Secondary | ICD-10-CM | POA: Diagnosis not present

## 2020-04-19 DIAGNOSIS — J45909 Unspecified asthma, uncomplicated: Secondary | ICD-10-CM | POA: Diagnosis not present

## 2020-04-19 DIAGNOSIS — Z79899 Other long term (current) drug therapy: Secondary | ICD-10-CM | POA: Insufficient documentation

## 2020-04-19 DIAGNOSIS — I10 Essential (primary) hypertension: Secondary | ICD-10-CM | POA: Diagnosis not present

## 2020-04-19 DIAGNOSIS — Z7984 Long term (current) use of oral hypoglycemic drugs: Secondary | ICD-10-CM | POA: Insufficient documentation

## 2020-04-19 DIAGNOSIS — R0981 Nasal congestion: Secondary | ICD-10-CM | POA: Diagnosis present

## 2020-04-19 DIAGNOSIS — E119 Type 2 diabetes mellitus without complications: Secondary | ICD-10-CM | POA: Insufficient documentation

## 2020-04-19 DIAGNOSIS — J0100 Acute maxillary sinusitis, unspecified: Secondary | ICD-10-CM | POA: Diagnosis not present

## 2020-04-19 MED ORDER — FEXOFENADINE-PSEUDOEPHED ER 60-120 MG PO TB12: 1.0000 | ORAL_TABLET | Freq: Two times a day (BID) | ORAL | 0 refills | Status: DC

## 2020-04-19 MED ORDER — SULFAMETHOXAZOLE-TRIMETHOPRIM 800-160 MG PO TABS: 1.0000 | ORAL_TABLET | Freq: Two times a day (BID) | ORAL | 0 refills | Status: DC

## 2020-04-19 NOTE — ED Triage Notes (Signed)
Arrives c/o bilateral ear's being clogged and sinus congestion x 1 day.  AAOx3.  Skin warm and dry  NAD

## 2020-04-19 NOTE — Discharge Instructions (Addendum)
Follow discharge care instruction take medication as directed. °

## 2020-04-19 NOTE — ED Provider Notes (Signed)
[facility] Emergency Department Provider Note   ____________________________________________   First MD Initiated Contact with Patient 04/19/20 [phone]     (approximate)  I have reviewed the triage vital signs and the nursing notes.   HISTORY  Chief Complaint Otalgia    HPI Cheryl Roberts is a 49 y.o. female patient presents with frontal headache, nasal congestion, and bilateral ear pressure.  Patient onset of complaint was proximately 1/2 weeks ago.  Patient complaint is worse in the last 3 days.  Patient also states there is a yellowish-greenish nasal discharge.  Denies fever associated with complaint.  Denies recent travel or known contact with COVID-19.         Past Medical History:  Diagnosis Date  . Asthma   . Cervical radiculopathy   . Chronic sinusitis   . Diabetes mellitus without complication ([place])   . Hypertension     Patient Active Problem List   Diagnosis Date Noted  . Adhesive capsulitis of left shoulder 10/15/2017  . Plantar fasciitis, right 10/15/2017  . Chronic pain of left upper extremity 09/19/2017  . Arthralgia of right temporomandibular joint 06/11/2017  . Pigmented skin lesion 06/11/2017  . 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 Spinal stenosis in cervical region 01/17/2017  . Chronic low back pain without sciatica 06/22/2016  . Strain of left trapezius muscle 06/22/2016  . Allergic rhinitis due to allergen 03/26/2016  . Hyperlipidemia associated with type 2 diabetes mellitus ([place]) 03/15/2016  . Hypertension 02/09/2016  . Type 2 diabetes mellitus with other specified complication ([place]) 09/32/6712  . DJD (degenerative joint disease) of cervical spine 02/09/2016  . Obesity (BMI 30.0-34.9) 02/09/2016  . Asthma, mild intermittent 02/09/2016  . Sialoadenitis 02/28/2003    History reviewed. No pertinent surgical history.  Prior to Admission medications   Medication Sig Start Date End Date Taking?  Authorizing Provider  albuterol (PROVENTIL HFA;VENTOLIN HFA) 108 (90 Base) MCG/ACT inhaler Inhale 2 puffs into the lungs every 6 (six) hours as needed for wheezing or shortness of breath. 08/22/18   Triplett, Johnette Abraham B, FNP  aspirin 81 MG tablet Take 1 tablet (81 mg total) by mouth daily. 03/15/16   Karamalegos, Devonne Doughty, DO  atorvastatin (LIPITOR) 40 MG tablet Take 1 tablet (40 mg total) by mouth daily. 03/17/17   Karamalegos, Devonne Doughty, DO  Blood Glucose Monitoring Suppl (TRUE METRIX METER) w/Device KIT 1 each by Does not apply route as directed. 05/29/16   Karamalegos, Devonne Doughty, DO  brompheniramine-pseudoephedrine-DM 30-2-10 MG/5ML syrup Take 5 mLs by mouth 4 (four) times daily as needed. 08/19/18   Sable Feil, PA-C  fexofenadine-pseudoephedrine (ALLEGRA-D) 60-120 MG 12 hr tablet Take 1 tablet by mouth 2 (two) times daily. 04/19/20   Sable Feil, PA-C  fluticasone (FLONASE) 50 MCG/ACT nasal spray Place 1 spray into both nostrils daily. For 2 to 4 weeks then as needed. 03/26/16   Karamalegos, Devonne Doughty, DO  glucose blood (TRUE METRIX BLOOD GLUCOSE TEST) test strip Check blood sugar twice daily. Dx E11.9 05/29/16   Olin Hauser, DO  hydrochlorothiazide (HYDRODIURIL) 25 MG tablet TAKE ONE TABLET BY MOUTH EVERY DAY 01/24/18   Karamalegos, Devonne Doughty, DO  Ipratropium-Albuterol (COMBIVENT) 20-100 MCG/ACT AERS respimat Inhale 1 puff into the lungs every 6 (six) hours as needed for wheezing or shortness of breath. 08/22/18   Triplett, Johnette Abraham B, FNP  LANCETS ULTRA FINE MISC 1 each by Does not apply route 2 (two) times daily. Check blood sugar  twice daily. Dx: E11.9 05/29/16   Olin Hauser, DO  lisinopril (PRINIVIL,ZESTRIL) 10 MG tablet TAKE ONE TABLET BY MOUTH EVERY DAY 01/24/18   Karamalegos, Devonne Doughty, DO  loratadine (CLARITIN) 10 MG tablet Take 1 tablet (10 mg total) by mouth daily. 03/26/16   Karamalegos, Devonne Doughty, DO  metFORMIN (GLUCOPHAGE-XR) 750 MG 24 hr tablet  TAKE ONE TABLET BY MOUTH EVERY DAY WITH BREAKFAST 09/10/17   Karamalegos, Devonne Doughty, DO  predniSONE (DELTASONE) 20 MG tablet Take 1 tablet daily for 5 days. 06/08/19   Johnn Hai, PA-C  pseudoephedrine (SUDAFED) 120 MG 12 hr tablet Take 1 tablet (120 mg total) by mouth 2 (two) times daily as needed for congestion. 02/22/20 02/21/21  Sable Feil, PA-C  sulfamethoxazole-trimethoprim (BACTRIM DS) 800-160 MG tablet Take 1 tablet by mouth 2 (two) times daily. 04/19/20   Sable Feil, PA-C    Allergies Amoxicillin  Family History  Problem Relation Age of Onset  . Hypertension Mother   . Hyperthyroidism Mother   . Gout Mother   . Diabetes Mother   . Arthritis Mother   . Cancer Father     Social History Social History   Tobacco Use  . Smoking status: Never Smoker  . Smokeless tobacco: Never Used  Substance Use Topics  . Alcohol use: Yes    Comment: occas wine or beer  . Drug use: No    Review of Systems  Constitutional: No fever/chills Eyes: No visual changes. ENT: Nasal congestion and bilateral ear pain. Cardiovascular: Denies chest pain. Respiratory: Denies shortness of breath. Gastrointestinal: No abdominal pain.  No nausea, no vomiting.  No diarrhea.  No constipation. Genitourinary: Negative for dysuria. Musculoskeletal: Negative for back pain. Skin: Negative for rash. Neurological: Positive for frontal headaches, denies focal weakness or numbness. Endocrine:  Diabetes, hyperlipidemia, and hypertension. Allergic/Immunilogical: Amoxicillin ____________________________________________   PHYSICAL EXAM:  VITAL SIGNS: ED Triage Vitals [04/19/20 0834]  Enc Vitals Group     BP 116/67     Pulse Rate 69     Resp 16     Temp 99 F (37.2 C)     Temp Source Oral     SpO2 99 %     Weight 136 lb 14.5 oz (62.1 kg)     Height '4\' 9"'  (1.448 m)     Head Circumference      Peak Flow      Pain Score 0     Pain Loc      Pain Edu?      Excl. in [place]?      Constitutional: Alert and oriented. Well appearing and in no acute distress. Eyes: Conjunctivae are normal. PERRL. EOMI. Head: Atraumatic. Nose: Edematous nasal turbinates thick rhinorrhea.  Frontal and maxillary sinus guarding with palpation. Mouth/Throat: Mucous membranes are moist.  Oropharynx non-erythematous.  Postnasal drainage. Neck: No stridor. Hematological/Lymphatic/Immunilogical: No cervical lymphadenopathy. Cardiovascular: Normal rate, regular rhythm. Grossly normal heart sounds.  Good peripheral circulation. Respiratory: Normal respiratory effort.  No retractions. Lungs CTAB. Skin:  Skin is warm, dry and intact. No rash noted. Psychiatric: Mood and affect are normal. Speech and behavior are normal.  ____________________________________________   LABS (all labs ordered are listed, but only abnormal results are displayed)  Labs Reviewed - No data to display ____________________________________________  EKG   ____________________________________________  RADIOLOGY I, Sable Feil, personally viewed and evaluated these images (plain radiographs) as part of my medical decision making, as well as reviewing the written report by the radiologist.  ED MD interpretation:    Official radiology report(s): No results found.  ____________________________________________   PROCEDURES  Procedure(s) performed (including Critical Care):  Procedures   ____________________________________________   INITIAL IMPRESSION / ASSESSMENT AND PLAN / ED COURSE  As part of my medical decision making, I reviewed the following data within the [facility]         Patient presents with frontal headache, maxillary guarding, and erythematous bilateral TMs.  Patient complaint physical exam consistent with sinusitis.  Patient given discharge care instruction advised take medication as directed.  Patient [facility]       ____________________________________________   FINAL CLINICAL IMPRESSION(S) / ED DIAGNOSES  Final diagnoses:  Subacute maxillary sinusitis  Otalgia of both ears     ED Discharge Orders         Ordered    sulfamethoxazole-trimethoprim (BACTRIM DS) 800-160 MG tablet  2 times daily        04/19/20 0849    fexofenadine-pseudoephedrine (ALLEGRA-D) 60-120 MG 12 hr tablet  2 times daily        04/19/20 0849          *Please note:  Cheryl Roberts was evaluated in Emergency Department on 04/19/2020 for the symptoms described in the history of present illness. She was evaluated in the context of the global COVID-19 pandemic, which necessitated consideration that the patient might be at risk for infection with the SARS-CoV-2 virus that causes COVID-19. Institutional protocols and algorithms that pertain to the evaluation of patients at risk for COVID-19 are in a state of rapid change based on information released by regulatory bodies including the CDC and federal and state organizations. These policies and algorithms were followed during the patient's care in the ED.  Some ED evaluations and interventions may be delayed as a result of limited staffing during and the pandemic.*   Note:  This document was prepared using Dragon voice recognition software and may include unintentional dictation errors.    Sable Feil, PA-C 04/19/20 8473    Harvest Dark, MD 04/19/20 1327

## 2020-04-19 NOTE — ED Notes (Signed)
See triage note - pt a/o. NAD

## 2020-07-28 ENCOUNTER — Other Ambulatory Visit: Payer: Self-pay

## 2020-07-28 ENCOUNTER — Emergency Department
Admission: EM | Admit: 2020-07-28 | Discharge: 2020-07-28 | Disposition: A | Discharge status: Home / Self Care | Payer: Self-pay | Attending: Emergency Medicine | Admitting: Emergency Medicine

## 2020-07-28 ENCOUNTER — Emergency Department: Payer: Self-pay

## 2020-07-28 ENCOUNTER — Encounter: Payer: Self-pay | Admitting: Emergency Medicine

## 2020-07-28 DIAGNOSIS — Z7984 Long term (current) use of oral hypoglycemic drugs: Secondary | ICD-10-CM | POA: Insufficient documentation

## 2020-07-28 DIAGNOSIS — Z7982 Long term (current) use of aspirin: Secondary | ICD-10-CM | POA: Insufficient documentation

## 2020-07-28 DIAGNOSIS — J4520 Mild intermittent asthma, uncomplicated: Secondary | ICD-10-CM | POA: Insufficient documentation

## 2020-07-28 DIAGNOSIS — E1169 Type 2 diabetes mellitus with other specified complication: Secondary | ICD-10-CM | POA: Insufficient documentation

## 2020-07-28 DIAGNOSIS — I10 Essential (primary) hypertension: Secondary | ICD-10-CM | POA: Insufficient documentation

## 2020-07-28 DIAGNOSIS — M25561 Pain in right knee: Secondary | ICD-10-CM

## 2020-07-28 DIAGNOSIS — Z79899 Other long term (current) drug therapy: Secondary | ICD-10-CM | POA: Insufficient documentation

## 2020-07-28 MED ORDER — MELOXICAM 15 MG PO TABS: 15.0000 mg | ORAL_TABLET | Freq: Every day | ORAL | 2 refills | Status: AC

## 2020-07-28 NOTE — ED Triage Notes (Signed)
Pt comes into the ED via POV c/o right knee pain.  Denies any injury to the knee but states there is swelling.  Pt in NAD and is ambulatory to triage at this time.

## 2020-07-28 NOTE — Discharge Instructions (Addendum)
Follow-up with your regular doctor as needed.  Follow-up with orthopedics if not improving in 1 week. Take the medication as prescribed. Plan ice to the right knee

## 2020-07-28 NOTE — ED Notes (Signed)
XR at bedside

## 2020-07-28 NOTE — ED Provider Notes (Signed)
[facility] Emergency Department Provider Note  ____________________________________________   Event Date/Time   First MD Initiated Contact with Patient 07/28/20 1330     (approximate)  I have reviewed the triage vital signs and the nursing notes.   HISTORY  Chief Complaint Knee Pain    HPI Cheryl Roberts is a 50 y.o. female presents emergency department complaining of right knee pain.  States ongoing symptoms for 3 days.  No known injury.  States a school nurse looked at it for her and told her it might have some inflammation or fluid.  No numbness or tingling.  Pain is only with movement and bearing weight.    Past Medical History:  Diagnosis Date  . Asthma   . Cervical radiculopathy   . Chronic sinusitis   . Diabetes mellitus without complication ([place])   . Hypertension     Patient Active Problem List   Diagnosis Date Noted  . Adhesive capsulitis of left shoulder 10/15/2017  . Plantar fasciitis, right 10/15/2017  . Chronic pain of left upper extremity 09/19/2017  . Arthralgia of right temporomandibular joint 06/11/2017  . Pigmented skin lesion 06/11/2017  . 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 Spinal stenosis in cervical region 01/17/2017  . Chronic low back pain without sciatica 06/22/2016  . Strain of left trapezius muscle 06/22/2016  . Allergic rhinitis due to allergen 03/26/2016  . Hyperlipidemia associated with type 2 diabetes mellitus ([place]) 03/15/2016  . Hypertension 02/09/2016  . Type 2 diabetes mellitus with other specified complication ([place]) 63/81/7711  . DJD (degenerative joint disease) of cervical spine 02/09/2016  . Obesity (BMI 30.0-34.9) 02/09/2016  . Asthma, mild intermittent 02/09/2016  . Sialoadenitis 02/28/2003    History reviewed. No pertinent surgical history.  Prior to Admission medications   Medication Sig Start Date End Date Taking? Authorizing Provider  meloxicam (MOBIC) 15 MG tablet Take  1 tablet (15 mg total) by mouth daily. 07/28/20 07/28/21 Yes Akira Adelsberger, Linden Dolin, PA-C  albuterol (PROVENTIL HFA;VENTOLIN HFA) 108 (90 Base) MCG/ACT inhaler Inhale 2 puffs into the lungs every 6 (six) hours as needed for wheezing or shortness of breath. 08/22/18   Triplett, Johnette Abraham B, FNP  aspirin 81 MG tablet Take 1 tablet (81 mg total) by mouth daily. 03/15/16   Karamalegos, Devonne Doughty, DO  atorvastatin (LIPITOR) 40 MG tablet Take 1 tablet (40 mg total) by mouth daily. 03/17/17   Karamalegos, Devonne Doughty, DO  Blood Glucose Monitoring Suppl (TRUE METRIX METER) w/Device KIT 1 each by Does not apply route as directed. 05/29/16   Karamalegos, Devonne Doughty, DO  brompheniramine-pseudoephedrine-DM 30-2-10 MG/5ML syrup Take 5 mLs by mouth 4 (four) times daily as needed. 08/19/18   Sable Feil, PA-C  fexofenadine-pseudoephedrine (ALLEGRA-D) 60-120 MG 12 hr tablet Take 1 tablet by mouth 2 (two) times daily. 04/19/20   Sable Feil, PA-C  fluticasone (FLONASE) 50 MCG/ACT nasal spray Place 1 spray into both nostrils daily. For 2 to 4 weeks then as needed. 03/26/16   Karamalegos, Devonne Doughty, DO  glucose blood (TRUE METRIX BLOOD GLUCOSE TEST) test strip Check blood sugar twice daily. Dx E11.9 05/29/16   Olin Hauser, DO  hydrochlorothiazide (HYDRODIURIL) 25 MG tablet TAKE ONE TABLET BY MOUTH EVERY DAY 01/24/18   Karamalegos, Devonne Doughty, DO  Ipratropium-Albuterol (COMBIVENT) 20-100 MCG/ACT AERS respimat Inhale 1 puff into the lungs every 6 (six) hours as needed for wheezing or shortness of breath. 08/22/18   Victorino Dike, FNP  LANCETS ULTRA FINE MISC 1 each by Does not apply route 2 (two) times daily. Check blood sugar twice daily. Dx: E11.9 05/29/16   Olin Hauser, DO  lisinopril (PRINIVIL,ZESTRIL) 10 MG tablet TAKE ONE TABLET BY MOUTH EVERY DAY 01/24/18   Karamalegos, Devonne Doughty, DO  loratadine (CLARITIN) 10 MG tablet Take 1 tablet (10 mg total) by mouth daily. 03/26/16   Karamalegos,  Devonne Doughty, DO  metFORMIN (GLUCOPHAGE-XR) 750 MG 24 hr tablet TAKE ONE TABLET BY MOUTH EVERY DAY WITH BREAKFAST 09/10/17   Karamalegos, Devonne Doughty, DO  predniSONE (DELTASONE) 20 MG tablet Take 1 tablet daily for 5 days. 06/08/19   Johnn Hai, PA-C  pseudoephedrine (SUDAFED) 120 MG 12 hr tablet Take 1 tablet (120 mg total) by mouth 2 (two) times daily as needed for congestion. 02/22/20 02/21/21  Sable Feil, PA-C  sulfamethoxazole-trimethoprim (BACTRIM DS) 800-160 MG tablet Take 1 tablet by mouth 2 (two) times daily. 04/19/20   Sable Feil, PA-C    Allergies Amoxicillin  Family History  Problem Relation Age of Onset  . Hypertension Mother   . Hyperthyroidism Mother   . Gout Mother   . Diabetes Mother   . Arthritis Mother   . Cancer Father     Social History Social History   Tobacco Use  . Smoking status: Never Smoker  . Smokeless tobacco: Never Used  Substance Use Topics  . Alcohol use: Yes    Comment: occas wine or beer  . Drug use: No    Review of Systems  Constitutional: No fever/chills Eyes: No visual changes. ENT: No sore throat. Respiratory: Denies cough Genitourinary: Negative for dysuria. Musculoskeletal: Negative for back pain.  Positive for right knee pain Skin: Negative for rash. Psychiatric: no mood changes,     ____________________________________________   PHYSICAL EXAM:  VITAL SIGNS: ED Triage Vitals  Enc Vitals Group     BP 07/28/20 1307 139/72     Pulse Rate 07/28/20 1307 75     Resp 07/28/20 1307 16     Temp 07/28/20 1307 98.2 F (36.8 C)     Temp Source 07/28/20 1307 Oral     SpO2 07/28/20 1307 100 %     Weight 07/28/20 1304 134 lb (60.8 kg)     Height 07/28/20 1304 '4\' 9"'  (1.448 m)     Head Circumference --      Peak Flow --      Pain Score 07/28/20 1304 5     Pain Loc --      Pain Edu? --      Excl. in [place]? --     Constitutional: Alert and oriented. Well appearing and in no acute distress. Eyes: Conjunctivae are  normal.  Head: Atraumatic. Nose: No congestion/rhinnorhea. Mouth/Throat: Mucous membranes are moist.   Neck:  supple no lymphadenopathy noted Cardiovascular: Normal rate, regular rhythm.  Respiratory: Normal respiratory effort.  No retractions GU: deferred Musculoskeletal: FROM all extremities, warm and well perfused, patient is able to bear weight,tender along patella, suprapatella bursa, and tendon, n/v intact Neurologic:  Normal speech and language.  Skin:  Skin is warm, dry and intact. No rash noted. Psychiatric: Mood and affect are normal. Speech and behavior are normal.  ____________________________________________   LABS (all labs ordered are listed, but only abnormal results are displayed)  Labs Reviewed - No data to display ____________________________________________   ____________________________________________  RADIOLOGY  X-ray of the right knee  ____________________________________________   PROCEDURES  Procedure(s) performed: Knee brace applied by  nursing staff  Procedures    ____________________________________________   INITIAL IMPRESSION / ASSESSMENT AND PLAN / ED COURSE  Pertinent labs & imaging results that were available during my care of the patient were reviewed by me and considered in my medical decision making (see chart for details).   Patient is a 50 year old female presents with right knee pain.  See HPI.   X-ray of the right knee reviewed by me and confirmed by radiology as normal  I did explain the findings to the patient.  She was given a prescription for meloxicam.  A knee brace for comfort.  She is to follow-up with orthopedics if not improving in 1 week.  Return emergency department worsening.  She was discharged in stable condition.  Cheryl Roberts was evaluated in Emergency Department on 07/28/2020 for the symptoms described in the history of present illness. She was evaluated in the context of the global COVID-19 pandemic, which  necessitated consideration that the patient might be at risk for infection with the SARS-CoV-2 virus that causes COVID-19. Institutional protocols and algorithms that pertain to the evaluation of patients at risk for COVID-19 are in a state of rapid change based on information released by regulatory bodies including the CDC and federal and state organizations. These policies and algorithms were followed during the patient's care in the ED.    As part of my medical decision making, I reviewed the following data within the [place] notes reviewed and incorporated, Old chart reviewed, Radiograph reviewed , Notes from prior ED visits and [place] Controlled Substance Database  ____________________________________________   FINAL CLINICAL IMPRESSION(S) / ED DIAGNOSES  Final diagnoses:  Acute pain of right knee      NEW MEDICATIONS STARTED DURING THIS VISIT:  New Prescriptions   MELOXICAM (MOBIC) 15 MG TABLET    Take 1 tablet (15 mg total) by mouth daily.     Note:  This document was prepared using Dragon voice recognition software and may include unintentional dictation errors.    Versie Starks, PA-C 07/28/20 1427    Blake Divine, MD 07/28/20 [phone]

## 2020-08-27 ENCOUNTER — Encounter: Payer: Self-pay | Admitting: Emergency Medicine

## 2020-08-27 ENCOUNTER — Emergency Department
Admission: EM | Admit: 2020-08-27 | Discharge: 2020-08-27 | Disposition: A | Discharge status: Home / Self Care | Payer: Medicaid Other | Attending: Emergency Medicine | Admitting: Emergency Medicine

## 2020-08-27 ENCOUNTER — Other Ambulatory Visit: Payer: Self-pay

## 2020-08-27 DIAGNOSIS — Z79899 Other long term (current) drug therapy: Secondary | ICD-10-CM | POA: Insufficient documentation

## 2020-08-27 DIAGNOSIS — I10 Essential (primary) hypertension: Secondary | ICD-10-CM | POA: Insufficient documentation

## 2020-08-27 DIAGNOSIS — M25511 Pain in right shoulder: Secondary | ICD-10-CM | POA: Insufficient documentation

## 2020-08-27 DIAGNOSIS — M25512 Pain in left shoulder: Secondary | ICD-10-CM | POA: Insufficient documentation

## 2020-08-27 DIAGNOSIS — E119 Type 2 diabetes mellitus without complications: Secondary | ICD-10-CM | POA: Insufficient documentation

## 2020-08-27 DIAGNOSIS — Z7982 Long term (current) use of aspirin: Secondary | ICD-10-CM | POA: Insufficient documentation

## 2020-08-27 DIAGNOSIS — Z7951 Long term (current) use of inhaled steroids: Secondary | ICD-10-CM | POA: Insufficient documentation

## 2020-08-27 DIAGNOSIS — J4520 Mild intermittent asthma, uncomplicated: Secondary | ICD-10-CM | POA: Insufficient documentation

## 2020-08-27 DIAGNOSIS — Z7984 Long term (current) use of oral hypoglycemic drugs: Secondary | ICD-10-CM | POA: Insufficient documentation

## 2020-08-27 MED ORDER — CYCLOBENZAPRINE HCL 5 MG PO TABS
5.0000 mg | ORAL_TABLET | Freq: Every day | ORAL | 0 refills | Status: DC
Start: 2020-08-27 — End: 2022-11-25

## 2020-08-27 NOTE — Discharge Instructions (Addendum)
You should take the previously prescribed meloxicam daily as directed. Take the muscle relaxant at night, as needed. Follow-up with your provider about referral back to your Orthopedic Surgeon. Return if needed.

## 2020-08-27 NOTE — ED Provider Notes (Signed)
[facility] Emergency Department Provider Note ____________________________________________  Time seen: 1132  I have reviewed the triage vital signs and the nursing notes.  HISTORY  Chief Complaint  Shoulder Pain  HPI Zoiee MONTI VILLERS is a 50 y.o. female with the below medical history, presents to the ED for several weeks of right shoulder pain.   Patient reports pain in the right shoulder similar to previously diagnosed adhesive capsulitis on the left shoulder.  She denies any preceding injury, trauma, or falls.  She is denying any grip changes, distal paresthesias, chest pain, or shortness of breath.  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 No pertinent surgical history.  Prior to Admission medications   Medication Sig Start Date End Date Taking? Authorizing Provider  cyclobenzaprine (FLEXERIL) 5 MG tablet Take 1 tablet (5 mg  total) by mouth at bedtime. 08/27/20  Yes Fareed Fung, Dannielle Karvonen, PA-C  albuterol (PROVENTIL HFA;VENTOLIN HFA) 108 (90 Base) MCG/ACT inhaler Inhale 2 puffs into the lungs every 6 (six) hours as needed for wheezing or shortness of breath. 08/22/18   Triplett, Johnette Abraham B, FNP  aspirin 81 MG tablet Take 1 tablet (81 mg total) by mouth daily. 03/15/16   Karamalegos, Devonne Doughty, DO  atorvastatin (LIPITOR) 40 MG tablet Take 1 tablet (40 mg total) by mouth daily. 03/17/17   Karamalegos, Devonne Doughty, DO  Blood Glucose Monitoring Suppl (TRUE METRIX METER) w/Device KIT 1 each by Does not apply route as directed. 05/29/16   Karamalegos, Devonne Doughty, DO  fexofenadine-pseudoephedrine (ALLEGRA-D) 60-120 MG 12 hr tablet Take 1 tablet by mouth 2 (two) times daily. 04/19/20   Sable Feil, PA-C  fluticasone (FLONASE) 50 MCG/ACT nasal spray Place 1 spray into both nostrils daily. For 2 to 4 weeks then as needed. 03/26/16   Karamalegos, Devonne Doughty, DO  glucose blood (TRUE METRIX BLOOD GLUCOSE TEST) test strip Check blood sugar twice daily. Dx E11.9 05/29/16   Olin Hauser, DO  hydrochlorothiazide (HYDRODIURIL) 25 MG tablet TAKE ONE TABLET BY MOUTH EVERY DAY 01/24/18   Karamalegos, Devonne Doughty, DO  Ipratropium-Albuterol (COMBIVENT) 20-100 MCG/ACT AERS respimat Inhale 1 puff into the lungs every 6 (six) hours as needed for wheezing or shortness of breath. 08/22/18   Triplett, Johnette Abraham B, FNP  LANCETS ULTRA FINE MISC 1 each by Does not apply route 2 (two) times daily. Check blood sugar twice daily. Dx: E11.9 05/29/16   Olin Hauser, DO  lisinopril (PRINIVIL,ZESTRIL) 10 MG tablet TAKE  ONE TABLET BY MOUTH EVERY DAY 01/24/18   Karamalegos, Devonne Doughty, DO  loratadine (CLARITIN) 10 MG tablet Take 1 tablet (10 mg total) by mouth daily. 03/26/16   Karamalegos, Devonne Doughty, DO  meloxicam (MOBIC) 15 MG tablet Take 1 tablet (15 mg total) by mouth daily. 07/28/20 07/28/21  Caryn Section, Linden Dolin, PA-C  metFORMIN (GLUCOPHAGE-XR)  750 MG 24 hr tablet TAKE ONE TABLET BY MOUTH EVERY DAY WITH BREAKFAST 09/10/17   Karamalegos, Devonne Doughty, DO  pseudoephedrine (SUDAFED) 120 MG 12 hr tablet Take 1 tablet (120 mg total) by mouth 2 (two) times daily as needed for congestion. 02/22/20 02/21/21  Sable Feil, PA-C    Allergies Amoxicillin  Family History  Problem Relation Age of Onset   Hypertension Mother    Hyperthyroidism Mother    Gout Mother    Diabetes Mother    Arthritis Mother    Cancer Father     Social History Social History   Tobacco Use   Smoking status: Never Smoker   Smokeless tobacco: Never Used  Substance Use Topics   Alcohol use: Yes    Comment: occas wine or beer   Drug use: No    Review of Systems  Constitutional: Negative for fever. Cardiovascular: Negative for chest pain. Respiratory: Negative for shortness of breath. Gastrointestinal: Negative for abdominal pain, vomiting and diarrhea. Genitourinary: Negative for dysuria. Musculoskeletal: Negative for back pain.  Left shoulder pain as above. Skin: Negative for rash. Neurological: Negative for headaches, focal weakness or numbness. ____________________________________________  PHYSICAL EXAM:  VITAL SIGNS: ED Triage Vitals  Enc Vitals Group     BP 08/27/20 1021 (!) 143/78     Pulse Rate 08/27/20 1021 80     Resp 08/27/20 1021 16     Temp 08/27/20 1021 98.1 F (36.7 C)     Temp Source 08/27/20 1021 Oral     SpO2 08/27/20 1021 99 %     Weight 08/27/20 1019 135 lb (61.2 kg)     Height 08/27/20 1019 '4\' 9"'  (1.448 m)     Head Circumference --      Peak Flow --      Pain Score 08/27/20 1019 6     Pain Loc --      Pain Edu? --      Excl. in [place]? --     Constitutional: Alert and oriented. Well appearing and in no distress. Head: Normocephalic and atraumatic. Eyes: Conjunctivae are normal. Normal extraocular movements Neck: Supple. No thyromegaly. Cardiovascular: Normal rate, regular rhythm. Normal distal  pulses. Respiratory: Normal respiratory effort.  Musculoskeletal: Right shoulder without obvious deformity or dislocation.  Patient able to perform active range of motion without limiting pain.  She is mildly tender to palpation to the biceps tendon.  Normal rotator cuff testing without deficit.  Nontender with normal range of motion in all extremities.  Normal composite fist bilaterally. Neurologic: Cranial nerves II through XII grossly intact.  Normal gait without ataxia. Normal speech and language. No gross focal neurologic deficits are appreciated. Skin:  Skin is warm, dry and intact. No rash noted. Psychiatric: Mood and affect are normal. Patient exhibits appropriate insight and judgment. ____________________________________________   RADIOLOGY  DG Left  Shoulder  IMPRESSION: Negative right knee radiographs. ____________________________________________  PROCEDURES  Procedures ____________________________________________  INITIAL IMPRESSION / ASSESSMENT AND PLAN / ED COURSE  DDX: shoulder strain, rotator cuff tendinitis, DJD, biceps tendinitis, adhesive capsulitis  Patient ED evaluation of few weeks of right shoulder pain, concerning to her her  for previously diagnosed adhesive capsulitis on the left.  Patient does have some mild impingement findings on exam and primarily is tender to palpation of the anterior biceps tendon.  Symptoms may represent a shoulder tendinitis or an early adhesive capsulitis presentation.  Clinically patient appears to have some mild biceps tendinitis.  There are also some distal paresthesias that may be indicative of a cervical disc uropathy.  Patient is discharged with instructions to dose her previously prescribed meloxicam.  Ice prescription for cyclobenzaprine is provided at this time.  She is referred to her [facility] provider at [facility] for further evaluation management.  Return precautions have been discussed.   AARIAN CLEAVER was evaluated in  Emergency Department on 08/27/2020 for the symptoms described in the history of present illness. She was evaluated in the context of the global COVID-19 pandemic, which necessitated consideration that the patient might be at risk for infection with the SARS-CoV-2 virus that causes COVID-19. Institutional protocols and algorithms that pertain to the evaluation of patients at risk for COVID-19 are in a state of rapid change based on information released by regulatory bodies including the CDC and federal and state organizations. These policies and algorithms were followed during the patient's care in the ED. ____________________________________________  FINAL CLINICAL IMPRESSION(S) / ED DIAGNOSES  Final diagnoses:  Acute pain of left shoulder      Carmie End, Dannielle Karvonen, PA-C 08/27/20 2008    Blake Divine, MD 08/30/20 2321

## 2020-08-27 NOTE — ED Triage Notes (Signed)
Pt to ER with c/o right shoulder pain that started several weeks ago.  Pt states had similar pain on left several years ago and was diagnosed with a frozen shoulder at that time.  Pt denies known injury.

## 2020-08-27 NOTE — ED Notes (Signed)
See triage note  Presents with right shoulder pain  Denies any injury  No deformity noted  Good pulses

## 2020-09-22 ENCOUNTER — Other Ambulatory Visit: Payer: Self-pay

## 2020-09-22 ENCOUNTER — Emergency Department
Admission: EM | Admit: 2020-09-22 | Discharge: 2020-09-22 | Disposition: A | Discharge status: Home / Self Care | Payer: Self-pay | Attending: Physician Assistant | Admitting: Physician Assistant

## 2020-09-22 ENCOUNTER — Encounter: Payer: Self-pay | Admitting: Emergency Medicine

## 2020-09-22 DIAGNOSIS — Z7984 Long term (current) use of oral hypoglycemic drugs: Secondary | ICD-10-CM | POA: Insufficient documentation

## 2020-09-22 DIAGNOSIS — I10 Essential (primary) hypertension: Secondary | ICD-10-CM | POA: Insufficient documentation

## 2020-09-22 DIAGNOSIS — J0100 Acute maxillary sinusitis, unspecified: Secondary | ICD-10-CM | POA: Insufficient documentation

## 2020-09-22 DIAGNOSIS — Z79899 Other long term (current) drug therapy: Secondary | ICD-10-CM | POA: Insufficient documentation

## 2020-09-22 DIAGNOSIS — H6981 Other specified disorders of Eustachian tube, right ear: Secondary | ICD-10-CM

## 2020-09-22 DIAGNOSIS — Z7951 Long term (current) use of inhaled steroids: Secondary | ICD-10-CM | POA: Insufficient documentation

## 2020-09-22 DIAGNOSIS — E119 Type 2 diabetes mellitus without complications: Secondary | ICD-10-CM | POA: Insufficient documentation

## 2020-09-22 DIAGNOSIS — J45909 Unspecified asthma, uncomplicated: Secondary | ICD-10-CM | POA: Insufficient documentation

## 2020-09-22 MED ORDER — FEXOFENADINE-PSEUDOEPHED ER 60-120 MG PO TB12: 1.0000 | ORAL_TABLET | Freq: Two times a day (BID) | ORAL | 0 refills | Status: DC

## 2020-09-22 MED ORDER — SULFAMETHOXAZOLE-TRIMETHOPRIM 800-160 MG PO TABS: 1.0000 | ORAL_TABLET | Freq: Two times a day (BID) | ORAL | 0 refills | Status: DC

## 2020-09-22 NOTE — Discharge Instructions (Addendum)
Read and follow discharge care instruction.  Take medication as directed. 

## 2020-09-22 NOTE — ED Provider Notes (Signed)
[facility] Emergency Department Provider Note   ____________________________________________   Event Date/Time   First MD Initiated Contact with Patient 09/22/20 [phone]     (approximate)  I have reviewed the triage vital signs and the nursing notes.   HISTORY  Chief Complaint Otalgia and Nasal Congestion    HPI Cheryl Roberts is a 50 y.o. female patient complain approximate 1 week of nasal congestion and intermittent right ear pain.  Denies hearing loss.  Denies fever chills associated complaint.  Denies recent travel or known contact with COVID-19.         Past Medical History:  Diagnosis Date  . Asthma   . Cervical radiculopathy   . Chronic sinusitis   . Diabetes mellitus without complication ([place])   . Hypertension     Patient Active Problem List   Diagnosis Date Noted  . Adhesive capsulitis of left shoulder 10/15/2017  . Plantar fasciitis, right 10/15/2017  . Chronic pain of left upper extremity 09/19/2017  . Arthralgia of right temporomandibular joint 06/11/2017  . Pigmented skin lesion 06/11/2017  . 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 Spinal stenosis in cervical region 01/17/2017  . Chronic low back pain without sciatica 06/22/2016  . Strain of left trapezius muscle 06/22/2016  . Allergic rhinitis due to allergen 03/26/2016  . Hyperlipidemia associated with type 2 diabetes mellitus ([place]) 03/15/2016  . Hypertension 02/09/2016  . Type 2 diabetes mellitus with other specified complication ([place]) 17/79/3903  . DJD (degenerative joint disease) of cervical spine 02/09/2016  . Obesity (BMI 30.0-34.9) 02/09/2016  . Asthma, mild intermittent 02/09/2016  . Sialoadenitis 02/28/2003    History reviewed. No pertinent surgical history.  Prior to Admission medications   Medication Sig Start Date End Date Taking? Authorizing Provider  fexofenadine-pseudoephedrine (ALLEGRA-D) 60-120 MG 12 hr tablet Take 1 tablet by mouth 2  (two) times daily. 09/22/20  Yes Sable Feil, PA-C  sulfamethoxazole-trimethoprim (BACTRIM DS) 800-160 MG tablet Take 1 tablet by mouth 2 (two) times daily. 09/22/20  Yes Sable Feil, PA-C  albuterol (PROVENTIL HFA;VENTOLIN HFA) 108 (90 Base) MCG/ACT inhaler Inhale 2 puffs into the lungs every 6 (six) hours as needed for wheezing or shortness of breath. 08/22/18   Triplett, Johnette Abraham B, FNP  aspirin 81 MG tablet Take 1 tablet (81 mg total) by mouth daily. 03/15/16   Karamalegos, Devonne Doughty, DO  atorvastatin (LIPITOR) 40 MG tablet Take 1 tablet (40 mg total) by mouth daily. 03/17/17   Karamalegos, Devonne Doughty, DO  Blood Glucose Monitoring Suppl (TRUE METRIX METER) w/Device KIT 1 each by Does not apply route as directed. 05/29/16   Karamalegos, Devonne Doughty, DO  cyclobenzaprine (FLEXERIL) 5 MG tablet Take 1 tablet (5 mg total) by mouth at bedtime. 08/27/20   Menshew, Dannielle Karvonen, PA-C  fexofenadine-pseudoephedrine (ALLEGRA-D) 60-120 MG 12 hr tablet Take 1 tablet by mouth 2 (two) times daily. 04/19/20   Sable Feil, PA-C  fluticasone (FLONASE) 50 MCG/ACT nasal spray Place 1 spray into both nostrils daily. For 2 to 4 weeks then as needed. 03/26/16   Karamalegos, Devonne Doughty, DO  glucose blood (TRUE METRIX BLOOD GLUCOSE TEST) test strip Check blood sugar twice daily. Dx E11.9 05/29/16   Olin Hauser, DO  hydrochlorothiazide (HYDRODIURIL) 25 MG tablet TAKE ONE TABLET BY MOUTH EVERY DAY 01/24/18   Karamalegos, Devonne Doughty, DO  Ipratropium-Albuterol (COMBIVENT) 20-100 MCG/ACT AERS respimat Inhale 1 puff into the lungs every 6 (six) hours as needed for  wheezing or shortness of breath. 08/22/18   Triplett, Johnette Abraham B, FNP  LANCETS ULTRA FINE MISC 1 each by Does not apply route 2 (two) times daily. Check blood sugar twice daily. Dx: E11.9 05/29/16   Olin Hauser, DO  lisinopril (PRINIVIL,ZESTRIL) 10 MG tablet TAKE ONE TABLET BY MOUTH EVERY DAY 01/24/18   Karamalegos, Devonne Doughty, DO   loratadine (CLARITIN) 10 MG tablet Take 1 tablet (10 mg total) by mouth daily. 03/26/16   Karamalegos, Devonne Doughty, DO  meloxicam (MOBIC) 15 MG tablet Take 1 tablet (15 mg total) by mouth daily. 07/28/20 07/28/21  Caryn Section, Linden Dolin, PA-C  metFORMIN (GLUCOPHAGE-XR) 750 MG 24 hr tablet TAKE ONE TABLET BY MOUTH EVERY DAY WITH BREAKFAST 09/10/17   Karamalegos, Devonne Doughty, DO  pseudoephedrine (SUDAFED) 120 MG 12 hr tablet Take 1 tablet (120 mg total) by mouth 2 (two) times daily as needed for congestion. 02/22/20 02/21/21  Sable Feil, PA-C    Allergies Amoxicillin  Family History  Problem Relation Age of Onset  . Hypertension Mother   . Hyperthyroidism Mother   . Gout Mother   . Diabetes Mother   . Arthritis Mother   . Cancer Father     Social History Social History   Tobacco Use  . Smoking status: Never Smoker  . Smokeless tobacco: Never Used  Substance Use Topics  . Alcohol use: Yes    Comment: occas wine or beer  . Drug use: No    Review of Systems Constitutional: No fever/chills Eyes: No visual changes. ENT: No sore throat.  Nasal congestion and right ear pain. Cardiovascular: Denies chest pain. Respiratory: Denies shortness of breath. Gastrointestinal: No abdominal pain.  No nausea, no vomiting.  No diarrhea.  No constipation. Genitourinary: Negative for dysuria. Musculoskeletal: Negative for back pain. Skin: Negative for rash. Neurological: Negative for headaches, focal weakness or numbness. Endocrine:  Diabetes, hyperlipidemia, and hypertension. Allergic/Immunilogical: Amoxicillin. ____________________________________________   PHYSICAL EXAM:  VITAL SIGNS: ED Triage Vitals  Enc Vitals Group     BP 09/22/20 0826 (!) 159/79     Pulse Rate 09/22/20 0826 80     Resp 09/22/20 0826 20     Temp 09/22/20 0826 97.9 F (36.6 C)     Temp Source 09/22/20 0826 Oral     SpO2 09/22/20 0826 98 %     Weight 09/22/20 0829 134 lb 14.7 oz (61.2 kg)     Height 09/22/20 0829  _0  (1.448 m)     Head Circumference --      Peak Flow --      Pain Score 09/22/20 0816 0     Pain Loc --      Pain Edu? --      Excl. in [place]? --     Constitutional: Alert and oriented. Well appearing and in no acute distress. Eyes: Conjunctivae are normal. PERRL. EOMI. Head: Atraumatic. Nose: Edematous nasal turbinates.  Bilateral maxillary guarding. Mouth/Throat: Mucous membranes are moist.  Oropharynx non-erythematous.  Postnasal drainage. Neck: No stridor.  Hematological/Lymphatic/Immunilogical: No cervical lymphadenopathy. Cardiovascular: Normal rate, regular rhythm. Grossly normal heart sounds.  Good peripheral circulation. Respiratory: Normal respiratory effort.  No retractions. Lungs CTAB. Gastrointestinal: Soft and nontender. No distention. No abdominal bruits. No CVA tenderness. Musculoskeletal: No lower extremity tenderness nor edema.  No joint effusions. Neurologic:  Normal speech and language. No gross focal neurologic deficits are appreciated. No gait instability. Skin:  Skin is warm, dry and intact. No rash noted. Psychiatric: Mood and affect are normal.  Speech and behavior are normal.  ____________________________________________   LABS (all labs ordered are listed, but only abnormal results are displayed)  Labs Reviewed - No data to display ____________________________________________  EKG   ____________________________________________  RADIOLOGY I, Sable Feil, personally viewed and evaluated these images (plain radiographs) as part of my medical decision making, as well as reviewing the written report by the radiologist.  ED MD interpretation:    Official radiology report(s): No results found.  ____________________________________________   PROCEDURES  Procedure(s) performed (including Critical Care):  Procedures   ____________________________________________   INITIAL IMPRESSION / ASSESSMENT AND PLAN / ED COURSE  As part of my medical  decision making, I reviewed the following data within the [facility]         Patient presents with 1 week of nasal congestion intermittent right ear pain.  Patient complaining physical exam is consistent with subacute maxillary sinusitis and eustachian tube dysfunction.  Patient given discharge care instruction take medication as directed.  Patient advised to establish care with the [facility].      ____________________________________________   FINAL CLINICAL IMPRESSION(S) / ED DIAGNOSES  Final diagnoses:  Subacute maxillary sinusitis  Acute dysfunction of right eustachian tube     ED Discharge Orders         Ordered    sulfamethoxazole-trimethoprim (BACTRIM DS) 800-160 MG tablet  2 times daily        09/22/20 0840    fexofenadine-pseudoephedrine (ALLEGRA-D) 60-120 MG 12 hr tablet  2 times daily        09/22/20 0840          *Please note:  Cheryl Roberts was evaluated in Emergency Department on 09/22/2020 for the symptoms described in the history of present illness. She was evaluated in the context of the global COVID-19 pandemic, which necessitated consideration that the patient might be at risk for infection with the SARS-CoV-2 virus that causes COVID-19. Institutional protocols and algorithms that pertain to the evaluation of patients at risk for COVID-19 are in a state of rapid change based on information released by regulatory bodies including the CDC and federal and state organizations. These policies and algorithms were followed during the patient's care in the ED.  Some ED evaluations and interventions may be delayed as a result of limited staffing during and the pandemic.*   Note:  This document was prepared using Dragon voice recognition software and may include unintentional dictation errors.    Sable Feil, PA-C 09/22/20 1959    Naaman Plummer, MD 09/22/20 [phone]

## 2020-09-22 NOTE — ED Notes (Signed)
See triage note  Presents with right ear pain and sinus drainage  No fever

## 2020-09-22 NOTE — ED Triage Notes (Signed)
Pt c/o R ear pain x 1 week and nasal congestion. Pt A&O x4, ambulatory to first RN desk without difficulty.

## 2021-04-25 ENCOUNTER — Emergency Department: Payer: Managed Care, Other (non HMO)

## 2021-04-25 ENCOUNTER — Other Ambulatory Visit: Payer: Self-pay

## 2021-04-25 ENCOUNTER — Emergency Department
Admission: EM | Admit: 2021-04-25 | Discharge: 2021-04-25 | Disposition: A | Discharge status: Home / Self Care | Payer: Managed Care, Other (non HMO) | Attending: Emergency Medicine | Admitting: Emergency Medicine

## 2021-04-25 DIAGNOSIS — G8929 Other chronic pain: Secondary | ICD-10-CM

## 2021-04-25 DIAGNOSIS — E785 Hyperlipidemia, unspecified: Secondary | ICD-10-CM | POA: Insufficient documentation

## 2021-04-25 DIAGNOSIS — R059 Cough, unspecified: Secondary | ICD-10-CM | POA: Diagnosis present

## 2021-04-25 DIAGNOSIS — Z20822 Contact with and (suspected) exposure to covid-19: Secondary | ICD-10-CM | POA: Insufficient documentation

## 2021-04-25 DIAGNOSIS — Z79899 Other long term (current) drug therapy: Secondary | ICD-10-CM | POA: Insufficient documentation

## 2021-04-25 DIAGNOSIS — I10 Essential (primary) hypertension: Secondary | ICD-10-CM | POA: Insufficient documentation

## 2021-04-25 DIAGNOSIS — E1169 Type 2 diabetes mellitus with other specified complication: Secondary | ICD-10-CM | POA: Diagnosis not present

## 2021-04-25 DIAGNOSIS — Z7984 Long term (current) use of oral hypoglycemic drugs: Secondary | ICD-10-CM | POA: Insufficient documentation

## 2021-04-25 DIAGNOSIS — J4520 Mild intermittent asthma, uncomplicated: Secondary | ICD-10-CM | POA: Diagnosis not present

## 2021-04-25 DIAGNOSIS — J069 Acute upper respiratory infection, unspecified: Secondary | ICD-10-CM | POA: Diagnosis not present

## 2021-04-25 DIAGNOSIS — Z7982 Long term (current) use of aspirin: Secondary | ICD-10-CM | POA: Diagnosis not present

## 2021-04-25 LAB — RESP PANEL BY RT-PCR (FLU A&B, COVID) ARPGX2
Influenza A by PCR: NEGATIVE
Influenza B by PCR: NEGATIVE
SARS Coronavirus 2 by RT PCR: NEGATIVE

## 2021-04-25 MED ORDER — BENZONATATE 100 MG PO CAPS: 100.0000 mg | ORAL_CAPSULE | Freq: Three times a day (TID) | ORAL | 0 refills | Status: AC | PRN

## 2021-04-25 NOTE — ED Provider Notes (Signed)
Emergency Medicine Provider Triage Evaluation Note  Cheryl Roberts , a 50 y.o. female  was evaluated in triage.  Pt complains of complaints of cold symptoms, ear pain, runny nose.  No fever.  Review of Systems  Positive: URI symptoms Negative: Chest pain, shortness of breath, vomiting diarrhea  Physical Exam  BP (!) 156/84   Pulse 86   Temp 98.2 F (36.8 C) (Oral)   Resp 18   Ht 4\' 9"  (1.448 m)   Wt 62.6 kg   SpO2 98%   BMI 29.86 kg/m  Gen:   Awake, no distress   Resp:  Normal effort  MSK:   Moves extremities without difficulty  Other:    Medical Decision Making  Medically screening exam initiated at 11:51 AM.  Appropriate orders placed.  Cheryl Roberts was informed that the remainder of the evaluation will be completed by another provider, this initial triage assessment does not replace that evaluation, and the importance of remaining in the ED until their evaluation is complete.  Respiratory panel sent   Francisco Capuchin, PA-C 04/25/21 1152    04/27/21, MD 04/25/21 [phone]

## 2021-04-25 NOTE — ED Triage Notes (Signed)
Pt to ED for cold sx since Saturday NAD noted Asking for drink and snack machine

## 2021-04-25 NOTE — ED Provider Notes (Signed)
[facility] Emergency Department Provider Note  ____________________________________________   Event Date/Time   First MD Initiated Contact with Patient 04/25/21 1243     (approximate)  I have reviewed the triage vital signs and the nursing notes.   HISTORY  Chief Complaint No chief complaint on file.   HPI Cheryl Roberts is a 50 y.o. female with a past medical history of asthma, cervical radiculopathy and frozen shoulder syndrome as well as chronic back pain and sciatica, DM, HTN and chronic sinusitis who presents for assessment of his 6 days mildly productive cough, ear pressure bilaterally and sore throat and congestion.  Patient states she thinks her chronic back pain may be a little worse when she is coughing but otherwise not significantly changed.  No new shortness of breath, chest pain, nausea, vomiting, diarrhea, burning with urination, rash, hemoptysis or other sick symptoms.  She has been taking some over-the-counter cold medicines but does not believe it helped that much.  No other acute concerns at this time.         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   Spinal stenosis in cervical region 01/17/2017   Chronic low back pain without sciatica 06/22/2016   Strain of left trapezius muscle 06/22/2016   Allergic rhinitis due to allergen 03/26/2016   Hyperlipidemia associated with type 2 diabetes mellitus ([place]) 03/15/2016   Hypertension 02/09/2016   Type 2 diabetes mellitus with other  specified complication ([place]) 27/74/1287   DJD (degenerative joint disease) of cervical spine 02/09/2016   Obesity (BMI 30.0-34.9) 02/09/2016   Asthma, mild intermittent 02/09/2016   Sialoadenitis 02/28/2003    No past surgical history on file.  Prior to Admission medications   Medication Sig Start Date End Date Taking? Authorizing Provider  benzonatate (TESSALON PERLES) 100 MG capsule Take 1 capsule (100 mg total) by mouth 3 (three) times daily as needed for up to 5 days for cough. 04/25/21 04/30/21 Yes Lucrezia Starch, MD  albuterol (PROVENTIL HFA;VENTOLIN HFA) 108 (90 Base) MCG/ACT inhaler Inhale 2 puffs into the lungs every 6 (six) hours as needed for wheezing or shortness of breath. 08/22/18   Triplett, Johnette Abraham B, FNP  aspirin 81 MG tablet Take 1 tablet (81 mg total) by mouth daily. 03/15/16   Karamalegos, Devonne Doughty, DO  atorvastatin (LIPITOR) 40 MG tablet Take 1 tablet (40 mg total) by mouth daily. 03/17/17   Karamalegos, Devonne Doughty, DO  Blood Glucose Monitoring Suppl (TRUE METRIX METER) w/Device KIT 1 each by Does not apply route as directed. 05/29/16   Karamalegos, Devonne Doughty, DO  cyclobenzaprine (FLEXERIL) 5 MG tablet Take 1 tablet (5 mg total) by mouth at bedtime. 08/27/20   Menshew, Dannielle Karvonen, PA-C  fexofenadine-pseudoephedrine (ALLEGRA-D) 60-120 MG 12 hr tablet Take 1 tablet by mouth 2 (two) times daily. 04/19/20   Sable Feil, PA-C  fexofenadine-pseudoephedrine (ALLEGRA-D) 60-120 MG 12 hr tablet Take 1 tablet by mouth 2 (two) times daily. 09/22/20   Sable Feil, PA-C  fluticasone (FLONASE) 50 MCG/ACT nasal spray Place 1 spray into both nostrils daily. For 2 to 4 weeks then as needed. 03/26/16   Karamalegos, Devonne Doughty, DO  glucose blood (TRUE METRIX BLOOD GLUCOSE TEST) test strip Check blood sugar twice daily. Dx E11.9 05/29/16   Olin Hauser, DO  hydrochlorothiazide (HYDRODIURIL) 25 MG tablet TAKE ONE TABLET BY MOUTH EVERY DAY 01/24/18   Karamalegos, Devonne Doughty, DO  Ipratropium-Albuterol (COMBIVENT) 20-100 MCG/ACT AERS respimat Inhale 1 puff into the lungs every 6 (six) hours as needed for wheezing or shortness of breath. 08/22/18   Triplett, Johnette Abraham B, FNP  LANCETS ULTRA FINE MISC 1 each by Does not apply route 2 (two) times daily. Check blood sugar twice daily. Dx: E11.9 05/29/16   Olin Hauser, DO  lisinopril (PRINIVIL,ZESTRIL) 10 MG tablet TAKE ONE TABLET BY MOUTH EVERY DAY 01/24/18   Karamalegos, Devonne Doughty, DO  loratadine (CLARITIN) 10 MG tablet Take 1 tablet (10 mg total) by mouth daily. 03/26/16   Karamalegos, Devonne Doughty, DO  meloxicam (MOBIC) 15 MG tablet Take 1 tablet (15 mg total) by mouth daily. 07/28/20 07/28/21  Caryn Section, Linden Dolin, PA-C  metFORMIN (GLUCOPHAGE-XR) 750 MG 24 hr tablet TAKE ONE TABLET BY MOUTH EVERY DAY WITH BREAKFAST 09/10/17   Karamalegos, Devonne Doughty, DO  sulfamethoxazole-trimethoprim (BACTRIM DS) 800-160 MG tablet Take 1 tablet by mouth 2 (two) times daily. 09/22/20   Sable Feil, PA-C    Allergies Amoxicillin  Family History  Problem Relation Age of Onset   Hypertension Mother    Hyperthyroidism Mother    Gout Mother    Diabetes Mother    Arthritis Mother    Cancer Father     Social History Social History   Tobacco Use   Smoking status: Never   Smokeless tobacco: Never  Substance Use Topics   Alcohol use: Yes    Comment: occas wine or beer   Drug use: No    Review of Systems  Review of Systems  Constitutional:  Negative for chills and fever.  HENT:  Positive for congestion and ear pain. Negative for sore throat.   Eyes:  Negative for pain.  Respiratory:  Positive for cough. Negative for stridor.   Cardiovascular:  Negative for chest pain.  Gastrointestinal:  Negative for vomiting.  Genitourinary:  Negative for dysuria.  Musculoskeletal:  Positive for back pain (chronic) and joint pain (chronic in R shoulder). Negative for myalgias.  Skin:  Negative for rash.  Neurological:  Negative for  seizures, loss of consciousness and headaches.  Psychiatric/Behavioral:  Negative for suicidal ideas.   All other systems reviewed and are negative.    ____________________________________________   PHYSICAL EXAM:  VITAL SIGNS: ED Triage Vitals [04/25/21 1040]  Enc Vitals Group     BP (!) 156/84     Pulse Rate 86     Resp 18     Temp 98.2 F (36.8 C)     Temp Source Oral     SpO2 98 %     Weight 138 lb (62.6 kg)     Height '4\' 9"'  (1.448 m)     Head Circumference      Peak Flow      Pain Score 4     Pain Loc      Pain Edu?      Excl. in [place]?    Vitals:   04/25/21 1040  BP: (!) 156/84  Pulse: 86  Resp: 18  Temp: 98.2 F (36.8 C)  SpO2: 98%   Physical Exam Vitals and nursing note reviewed.  Constitutional:      General: She is not in acute distress.    Appearance: She is well-developed.  HENT:     Head: Normocephalic and atraumatic.     Right Ear: External ear normal.     Left Ear: External ear normal.     Nose: Nose normal.     Mouth/Throat:     Mouth: Mucous membranes are moist.     Pharynx: Posterior oropharyngeal erythema present. No oropharyngeal exudate.  Eyes:     Conjunctiva/sclera: Conjunctivae normal.  Cardiovascular:     Rate and Rhythm: Normal rate and regular rhythm.     Heart sounds: No murmur heard. Pulmonary:     Effort: Pulmonary effort is normal. No respiratory distress.     Breath sounds: Normal breath sounds. No stridor. No wheezing, rhonchi or rales.  Abdominal:     Palpations: Abdomen is soft.     Tenderness: There is no abdominal tenderness.  Musculoskeletal:     Cervical back: Neck supple.  Skin:    General: Skin is warm and dry.  Neurological:     Mental Status: She is alert and oriented to person, place, and time.  Psychiatric:        Mood and Affect: Mood normal.    TMs are unremarkable bilaterally.  Oropharyngeal exam has no exudates uvular deviation or significant tonsillar enlargement.  Patient has full range of motion of  her neck.  Cranial nerves II through XII are grossly intact. ____________________________________________   LABS (all labs ordered are listed, but only abnormal results are displayed)  Labs Reviewed  RESP PANEL BY RT-PCR (FLU A&B, COVID) [id number]   ____________________________________________  EKG  ____________________________________________  RADIOLOGY  ED MD interpretation: Chest x-rays no focal consolidation, effusion, edema, pneumothorax or other clear acute abnormality.  Official radiology report(s): DG Chest 2 View  Result Date: 04/25/2021 CLINICAL DATA:  Cough and cold symptoms. EXAM: CHEST - 2 VIEW COMPARISON:  Chest x-ray dated May 29, 2018. FINDINGS: The heart size and mediastinal contours are within normal limits. Both lungs are clear. The visualized skeletal structures are unremarkable. IMPRESSION: No active cardiopulmonary disease. Electronically Signed   By: Titus Dubin M.D.   On: 04/25/2021 13:24    ____________________________________________   PROCEDURES  Procedure(s) performed (including Critical Care):  Procedures   ____________________________________________   INITIAL IMPRESSION / ASSESSMENT AND PLAN / ED COURSE      Patient presents with above-stated history exam for assessment of prior 6 days of URI symptoms described above.  On arrival she is slight hypertensive with otherwise stable vital signs on room air.  On exam her lungs are clear bilaterally.  She does not appear to be in respiratory distress and has no work of breathing or wheezing.  She has some mild posterior oropharyngeal erythema but no other evidence of a deep space infection of the head or neck or otitis media.  She does not appear septic or meningitic.  Chest x-ray shows no focal infiltrates given absence of fever I have a low suspicion for bacterial pneumonia.  Overall presentation is much more consistent with a viral URI and I have low suspicion for ACS, PE or  dissection.  COVID and influenza PCR sent.  Given stable vitals otherwise reassuring exam work-up I think patient stable for discharge with outpatient follow-up.  Advised her to have her hypertension rechecked by PCP in a couple days.  She will follow-up her COVID  influenza PCR online.  Discharged in stable condition.  Strict return precautions advised and discussed.        ____________________________________________   FINAL CLINICAL IMPRESSION(S) / ED DIAGNOSES  Final diagnoses:  Viral URI with cough  Hypertension, unspecified type  Chronic back pain, unspecified back location, unspecified back pain laterality    Medications - No data to display   ED Discharge Orders          Ordered    benzonatate (TESSALON PERLES) 100 MG capsule  3 times daily PRN        04/25/21 1329             Note:  This document was prepared using Dragon voice recognition software and may include unintentional dictation errors.    Lucrezia Starch, MD 04/25/21 1330

## 2021-10-22 ENCOUNTER — Ambulatory Visit (LOCAL_COMMUNITY_HEALTH_CENTER): Payer: Medicaid Other

## 2021-10-22 DIAGNOSIS — R7611 Nonspecific reaction to tuberculin skin test without active tuberculosis: Secondary | ICD-10-CM

## 2021-10-22 NOTE — Progress Notes (Signed)
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 ? Per [employer] / [employer] EHR review: Hx positive ppd. PPD 01/24/2017, PPDR 01/27/2017 (13 mm).  ?Chest xray on 01/27/2017 Negative. ?[facility] / TB Med : INH x 9 mo / completed on 11/21/2017.  ? ?Denies all symptoms. TB screen completed and copy given to pt. Josie Saunders, RN ? ?

## 2022-01-12 ENCOUNTER — Encounter: Payer: Self-pay | Admitting: Emergency Medicine

## 2022-01-12 ENCOUNTER — Other Ambulatory Visit: Payer: Self-pay

## 2022-01-12 ENCOUNTER — Emergency Department
Admission: EM | Admit: 2022-01-12 | Discharge: 2022-01-12 | Disposition: A | Discharge status: Home / Self Care | Payer: Managed Care, Other (non HMO) | Attending: Emergency Medicine | Admitting: Emergency Medicine

## 2022-01-12 DIAGNOSIS — H6123 Impacted cerumen, bilateral: Secondary | ICD-10-CM | POA: Insufficient documentation

## 2022-01-12 DIAGNOSIS — J45909 Unspecified asthma, uncomplicated: Secondary | ICD-10-CM | POA: Insufficient documentation

## 2022-01-12 DIAGNOSIS — H6693 Otitis media, unspecified, bilateral: Secondary | ICD-10-CM | POA: Insufficient documentation

## 2022-01-12 DIAGNOSIS — I10 Essential (primary) hypertension: Secondary | ICD-10-CM | POA: Insufficient documentation

## 2022-01-12 DIAGNOSIS — H6690 Otitis media, unspecified, unspecified ear: Secondary | ICD-10-CM

## 2022-01-12 DIAGNOSIS — R0981 Nasal congestion: Secondary | ICD-10-CM | POA: Insufficient documentation

## 2022-01-12 DIAGNOSIS — E119 Type 2 diabetes mellitus without complications: Secondary | ICD-10-CM | POA: Insufficient documentation

## 2022-01-12 MED ORDER — DOXYCYCLINE HYCLATE 100 MG PO CAPS: 100.0000 mg | ORAL_CAPSULE | Freq: Two times a day (BID) | ORAL | 0 refills | Status: DC

## 2022-01-12 MED ORDER — CARBAMIDE PEROXIDE 6.5 % OT SOLN
10.0000 [drp] | Freq: Once | OTIC | Status: AC
  Administered 2022-01-12: 10 [drp] via OTIC
  Filled 2022-01-12: qty 15

## 2022-01-12 NOTE — Discharge Instructions (Signed)
Take Tylenol or ibuprofen if needed for pain.  If symptoms or not improving over the next few days, please follow-up with your primary care provider.

## 2022-01-12 NOTE — ED Notes (Signed)
Debrox applied. Patient was educated on use and wait times before flushing ears.

## 2022-01-12 NOTE — ED Provider Notes (Signed)
[facility] Provider Note    Event Date/Time   First MD Initiated Contact with Patient 01/12/22 1126     (approximate)   History   Otalgia   HPI  Cheryl Roberts is a 51 y.o. female presents emergency department for treatment and evaluation of right ear pain that has been present for the past 2 weeks.  No fever.  She feels that there is fluid in it.  She is also had some intermittent nasal congestion.  No decrease in ability to hear.  No alleviating measures attempted prior to arrival.  Past Medical History:  Diagnosis Date   Asthma    Cervical radiculopathy    Chronic sinusitis    Diabetes mellitus without complication (HCC)    Hypertension      Physical Exam   Triage Vital Signs: ED Triage Vitals  Enc Vitals Group     BP 01/12/22 1116 (!) 145/72     Pulse Rate 01/12/22 1116 72     Resp 01/12/22 1116 17     Temp 01/12/22 1116 98.4 F (36.9 C)     Temp Source 01/12/22 1116 Oral     SpO2 01/12/22 1116 99 %     Weight 01/12/22 1112 134 lb (60.8 kg)     Height 01/12/22 1112 4\' 9"  (1.448 m)     Head Circumference --      Peak Flow --      Pain Score 01/12/22 1112 7     Pain Loc --      Pain Edu? --      Excl. in [place]? --     Most recent vital signs: Vitals:   01/12/22 1116 01/12/22 1407  BP: (!) 145/72 139/77  Pulse: 72 76  Resp: 17 14  Temp: 98.4 F (36.9 C)   SpO2: 99% 99%    General: Awake, no distress.  CV:  Good peripheral perfusion.  Resp:  Normal effort.  Abd:  No distention.  Other:  .  Cerumen impaction bilateral.  Tympanic membrane on the right erythematous and dull.    ED Results / Procedures / Treatments   Labs (all labs ordered are listed, but only abnormal results are displayed) Labs Reviewed - No data to display   EKG     RADIOLOGY  Not indicated  I have independently reviewed and interpreted imaging as well as reviewed report from radiology.  PROCEDURES:  Critical Care performed:     Procedures   MEDICATIONS ORDERED IN ED:  Medications  carbamide peroxide (DEBROX) 6.5 % OTIC (EAR) solution 10 drop (10 drops Both EARS Given 01/12/22 1221)     IMPRESSION / MDM / ASSESSMENT AND PLAN / ED COURSE   I reviewed the triage vital signs and the nursing notes.  Differential diagnosis includes, but is not limited to: Serous otitis media, suppurative otitis media, sinus infection, cerumen impaction  Patient's presentation is most consistent with acute, uncomplicated illness.  51 year old female presenting to the emergency department for treatment and evaluation of right otalgia that started 2 weeks ago.  See HPI for further details.  On exam, she has cerumen impaction bilaterally which was cleared after Debrox, irrigation, and curette.  Tympanic membrane on the right is erythematous and dull.  She will be treated with antibiotic and encouraged to follow-up with her primary care provider if not improving over the next few days.  If symptoms change or worsen she is unable to schedule appointment, she is to return to the emergency  department.      FINAL CLINICAL IMPRESSION(S) / ED DIAGNOSES   Final diagnoses:  Acute otitis media, unspecified otitis media type  Bilateral impacted cerumen     Rx / DC Orders   ED Discharge Orders          Ordered    doxycycline (VIBRAMYCIN) 100 MG capsule  2 times daily        01/12/22 1359             Note:  This document was prepared using Dragon voice recognition software and may include unintentional dictation errors.   Chinita Pester, FNP 01/12/22 1614    Sharyn Creamer, MD 01/12/22 1759

## 2022-01-12 NOTE — ED Triage Notes (Signed)
Pt reports pain to her right ear for the past 2 weeks.Pt reports she can feel fluid in it. Pt reports some nasal congestion as well.

## 2022-05-08 ENCOUNTER — Other Ambulatory Visit: Payer: Self-pay

## 2022-05-08 ENCOUNTER — Emergency Department
Admission: EM | Admit: 2022-05-08 | Discharge: 2022-05-08 | Disposition: A | Discharge status: Home / Self Care | Payer: 59 | Attending: Emergency Medicine | Admitting: Emergency Medicine

## 2022-05-08 DIAGNOSIS — M436 Torticollis: Secondary | ICD-10-CM | POA: Diagnosis not present

## 2022-05-08 DIAGNOSIS — M62838 Other muscle spasm: Secondary | ICD-10-CM

## 2022-05-08 DIAGNOSIS — M542 Cervicalgia: Secondary | ICD-10-CM | POA: Diagnosis not present

## 2022-05-08 MED ORDER — MELOXICAM 15 MG PO TABS: 15.0000 mg | ORAL_TABLET | Freq: Every day | ORAL | 2 refills | Status: DC

## 2022-05-08 MED ORDER — BACLOFEN 10 MG PO TABS: 10.0000 mg | ORAL_TABLET | Freq: Three times a day (TID) | ORAL | 0 refills | Status: AC

## 2022-05-08 NOTE — ED Provider Notes (Signed)
[facility] Provider Note    Event Date/Time   First MD Initiated Contact with Patient 05/08/22 [phone]     (approximate)   History   Neck Pain   HPI  Cheryl Roberts is a 51 y.o. female with history of diabetes, hypertension, cervical radiculopathy and asthma presents emergency department complaining of neck and upper back pain.  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  She states sometimes she mops but not always.  No numbness or tingling.  No fall or injury.  Took a Flexeril without any relief.      Physical Exam   Triage Vital Signs: ED Triage Vitals [05/08/22 0822]  Enc Vitals Group     BP 119/75     Pulse Rate 75     Resp 18     Temp 98.6 F (37 C)     Temp src      SpO2 98 %     Weight      Height      Head Circumference      Peak Flow      Pain Score 8     Pain Loc      Pain Edu?      Excl. in [place]?     Most recent vital signs: Vitals:   05/08/22 0822  BP: 119/75  Pulse: 75  Resp: 18  Temp: 98.6 F (37 C)  SpO2: 98%     General: Awake, no distress.   CV:  Good peripheral perfusion. regular rate and  rhythm Resp:  Normal effort.  Abd:  No distention.   Other:  C-spine is nontender, right trapezius muscle is spasmed and tender, grips are equal bilaterally, decreased range of motion with forward flexion and hyperextension, neurovascular is intact   ED Results / Procedures / Treatments   Labs (all labs ordered are listed, but only abnormal results are displayed) Labs Reviewed - No data to display   EKG     RADIOLOGY     PROCEDURES:   Procedures   MEDICATIONS ORDERED IN ED: Medications - No data to display   IMPRESSION / MDM / ASSESSMENT AND PLAN / ED COURSE  I reviewed the triage vital signs and the nursing notes.                              Differential diagnosis includes, but is not limited to, spasm, cervical radiculopathy, chronic neck pain  Patient's presentation is most consistent with acute,  uncomplicated illness.   The patient's presentation is more consistent with a muscle spasm.  She did originally request an x-ray.  When I explained to her that would only show bone and not the muscles she decided to try the medication without having an x-ray.  She is to use the muscle relaxer and anti-inflammatory as prescribed.  Follow-up with her regular doctor as needed.  Follow-up with orthopedics if not improving in 1 week.  Return to the emergency department if worsening.  She is in agreement treatment plan.  Discharged stable condition.      FINAL CLINICAL IMPRESSION(S) / ED DIAGNOSES   Final diagnoses:  Torticollis, acute  Muscle spasms of neck     Rx / DC Orders   ED Discharge Orders          Ordered    baclofen (LIORESAL) 10 MG tablet  3 times daily        05/08/22 9702  meloxicam (MOBIC) 15 MG tablet  Daily        05/08/22 6222             Note:  This document was prepared using Dragon voice recognition software and may include unintentional dictation errors.    Faythe Ghee, PA-C 05/08/22 9798    Dionne Bucy, MD 05/08/22 [phone]

## 2022-05-08 NOTE — Discharge Instructions (Addendum)
Follow-up with your regular doctor as needed.  Return emergency department worsening.  Take medications as prescribed.  Take the baclofen during the day and you may take the Flexeril at night or continue with baclofen.  Do not take both.  The meloxicam daily.  You may also take Tylenol.  Do not take ibuprofen as it will be too much on your kidneys.

## 2022-05-08 NOTE — ED Triage Notes (Signed)
Pt comes with c/o neck and back pain that started yesterday. Pt states he was just mopping. Pt denies any injuries.

## 2022-05-27 ENCOUNTER — Emergency Department
Admission: EM | Admit: 2022-05-27 | Discharge: 2022-05-27 | Disposition: A | Discharge status: Home / Self Care | Payer: 59 | Attending: Student in an Organized Health Care Education/Training Program | Admitting: Student in an Organized Health Care Education/Training Program

## 2022-05-27 ENCOUNTER — Emergency Department: Payer: 59

## 2022-05-27 ENCOUNTER — Other Ambulatory Visit: Payer: Self-pay

## 2022-05-27 DIAGNOSIS — I10 Essential (primary) hypertension: Secondary | ICD-10-CM | POA: Diagnosis not present

## 2022-05-27 DIAGNOSIS — M79645 Pain in left finger(s): Secondary | ICD-10-CM | POA: Insufficient documentation

## 2022-05-27 DIAGNOSIS — E119 Type 2 diabetes mellitus without complications: Secondary | ICD-10-CM | POA: Diagnosis not present

## 2022-05-27 NOTE — ED Triage Notes (Signed)
Pt here with c/o of L thumb pain that has been ongoing for 1 week. Pt denies injury or trauma. NAD noted.

## 2022-05-27 NOTE — ED Provider Notes (Signed)
[facility] Provider Note    Event Date/Time   First MD Initiated Contact with Patient 05/27/22 1150     (approximate)   History   Thumb Pain   HPI  Cheryl Roberts is a 51 y.o. female with a past medical history of obesity, hypertension, diabetes, hyperlipidemia presents today for evaluation of left thumb pain.  Patient reports that this has been ongoing for the past 2 weeks.  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  She reports that her pain is in her thumb only.  She denies paresthesias.  She has not noticed any swelling or discoloration.  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   Spinal stenosis in cervical region 01/17/2017   Chronic low back pain without sciatica 06/22/2016   Strain of left trapezius muscle 06/22/2016   Allergic rhinitis due to allergen 03/26/2016   Hyperlipidemia associated with type 2 diabetes mellitus ([place]) 03/15/2016   Hypertension 02/09/2016   Type 2 diabetes mellitus with other specified complication ([place]) 99991111   DJD (degenerative joint disease) of cervical spine 02/09/2016   Obesity (BMI 30.0-34.9) 02/09/2016   Asthma, mild intermittent 02/09/2016   Sialoadenitis 02/28/2003          Physical Exam   Triage Vital Signs: ED Triage Vitals  Enc Vitals Group     BP 05/27/22 1133 125/77     Pulse Rate 05/27/22 1133 73     Resp 05/27/22 1133 18     Temp --      Temp src --      SpO2 05/27/22 1133 96 %     Weight 05/27/22 1152 134 lb 0.6 oz (60.8 kg)     Height 05/27/22 1152 4\' 9"  (1.448 m)     Head Circumference --      Peak Flow --      Pain Score 05/27/22 1133  7     Pain Loc --      Pain Edu? --      Excl. in [place]? --     Most recent vital signs: Vitals:   05/27/22 1133  BP: 125/77  Pulse: 73  Resp: 18  SpO2: 96%    Physical Exam Vitals and nursing note reviewed.  Constitutional:      General: Awake and alert. No acute distress.    Appearance: Normal appearance. The patient is normal weight.  HENT:     Head: Normocephalic and atraumatic.     Mouth: Mucous membranes are moist.  Eyes:     General: PERRL. Normal EOMs        Right eye: No discharge.        Left eye: No discharge.     Conjunctiva/sclera: Conjunctivae normal.  Cardiovascular:     Rate and Rhythm: Normal rate and regular rhythm.     Pulses: Normal pulses.  Pulmonary:     Effort: Pulmonary effort is normal. No respiratory distress.     Breath sounds: Normal breath sounds.  Abdominal:     Abdomen is soft. There is no abdominal tenderness. No rebound or guarding. No distention. Musculoskeletal:        General:  No swelling. Normal range of motion.     Cervical back: Normal range of motion and neck supple.  Skin:    General: Skin is warm and dry.     Capillary Refill: Capillary refill takes less than 2 seconds.     Findings: No rash.  Neurological:     Mental Status: The patient is awake and alert.      ED Results / Procedures / Treatments   Labs (all labs ordered are listed, but only abnormal results are displayed) Labs Reviewed - No data to display   EKG     RADIOLOGY I independently reviewed and interpreted imaging and agree with radiologists findings.     PROCEDURES:  Critical Care performed:   Procedures   MEDICATIONS ORDERED IN ED: Medications - No data to display   IMPRESSION / MDM / ASSESSMENT AND PLAN / ED COURSE  I reviewed the triage vital signs and the nursing notes.   Differential diagnosis includes, but is not limited to, sprain, contusion, carpal tunnel syndrome, less likely fracture, dislocation, or bone lesion.  Patient is  awake and alert, hemodynamically stable and afebrile.  There is no obvious deformity, swelling, erythema, or skin changes noted.  No infectious symptoms or cellulitic changes.  She has full normal range of motion of her thumb and all fingers, no swelling or deformity noted.  X-ray is negative for any acute findings.  She does have a positive Finkelstein test, and it is possible that she has a dequervains tenosynovitis.  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  She was given a thumb spica splint and instructed to follow-up with her outpatient provider.  We discussed ice, Tylenol, ibuprofen.  Patient understands and agrees with plan.  She was discharged in stable condition.   Patient's presentation is most consistent with acute complicated illness / injury requiring diagnostic workup.      FINAL CLINICAL IMPRESSION(S) / ED DIAGNOSES   Final diagnoses:  Thumb pain, left     Rx / DC Orders   ED Discharge Orders     None        Note:  This document was prepared using Dragon voice recognition software and may include unintentional dictation errors.   Jackelyn Hoehn, PA-C 05/27/22 1458    Willy Eddy, MD 05/27/22 [phone]

## 2022-05-27 NOTE — Discharge Instructions (Signed)
Your xray was negative.  You can wear the brace to help with your symptoms.  Please return for any new, worsening, or change in symptoms other concerns.

## 2022-06-08 DIAGNOSIS — Z7984 Long term (current) use of oral hypoglycemic drugs: Secondary | ICD-10-CM | POA: Diagnosis not present

## 2022-06-08 DIAGNOSIS — M546 Pain in thoracic spine: Secondary | ICD-10-CM | POA: Diagnosis not present

## 2022-06-08 DIAGNOSIS — E1159 Type 2 diabetes mellitus with other circulatory complications: Secondary | ICD-10-CM | POA: Diagnosis not present

## 2022-06-08 DIAGNOSIS — M542 Cervicalgia: Secondary | ICD-10-CM | POA: Diagnosis not present

## 2022-06-08 DIAGNOSIS — Z880 Allergy status to penicillin: Secondary | ICD-10-CM | POA: Diagnosis not present

## 2022-06-08 DIAGNOSIS — I10 Essential (primary) hypertension: Secondary | ICD-10-CM | POA: Diagnosis not present

## 2022-06-08 DIAGNOSIS — M79645 Pain in left finger(s): Secondary | ICD-10-CM | POA: Diagnosis not present

## 2022-06-08 DIAGNOSIS — M545 Low back pain, unspecified: Secondary | ICD-10-CM | POA: Diagnosis not present

## 2022-06-08 DIAGNOSIS — E785 Hyperlipidemia, unspecified: Secondary | ICD-10-CM | POA: Diagnosis not present

## 2022-06-08 DIAGNOSIS — Z79899 Other long term (current) drug therapy: Secondary | ICD-10-CM | POA: Diagnosis not present

## 2022-06-08 DIAGNOSIS — G8929 Other chronic pain: Secondary | ICD-10-CM | POA: Diagnosis not present

## 2022-06-19 DIAGNOSIS — M79645 Pain in left finger(s): Secondary | ICD-10-CM | POA: Diagnosis not present

## 2022-07-02 DIAGNOSIS — M25542 Pain in joints of left hand: Secondary | ICD-10-CM | POA: Diagnosis not present

## 2022-07-02 DIAGNOSIS — M79645 Pain in left finger(s): Secondary | ICD-10-CM | POA: Diagnosis not present

## 2022-07-24 ENCOUNTER — Emergency Department
Admission: EM | Admit: 2022-07-24 | Discharge: 2022-07-24 | Disposition: A | Discharge status: Home / Self Care | Payer: Medicaid Other | Attending: Emergency Medicine | Admitting: Emergency Medicine

## 2022-07-24 ENCOUNTER — Encounter: Payer: Self-pay | Admitting: Emergency Medicine

## 2022-07-24 DIAGNOSIS — U071 COVID-19: Secondary | ICD-10-CM | POA: Insufficient documentation

## 2022-07-24 DIAGNOSIS — E119 Type 2 diabetes mellitus without complications: Secondary | ICD-10-CM | POA: Insufficient documentation

## 2022-07-24 DIAGNOSIS — J45909 Unspecified asthma, uncomplicated: Secondary | ICD-10-CM | POA: Diagnosis not present

## 2022-07-24 DIAGNOSIS — R059 Cough, unspecified: Secondary | ICD-10-CM | POA: Diagnosis present

## 2022-07-24 DIAGNOSIS — I10 Essential (primary) hypertension: Secondary | ICD-10-CM | POA: Insufficient documentation

## 2022-07-24 LAB — RESP PANEL BY RT-PCR (RSV, FLU A&B, COVID)  RVPGX2
Influenza A by PCR: NEGATIVE
Influenza B by PCR: NEGATIVE
Resp Syncytial Virus by PCR: NEGATIVE
SARS Coronavirus 2 by RT PCR: POSITIVE — AB

## 2022-07-24 NOTE — ED Triage Notes (Signed)
Pt reports sneezing, eye and ear pain, nasal drainage and back pain since yesterday.

## 2022-07-24 NOTE — Discharge Instructions (Signed)
Your swab is positive for COVID-19.  Please follow-up with your outpatient provider.  You may continue to take Tylenol/ibuprofen per package instructions as needed for your symptoms.  Remember that you are highly contagious to others and to remain in isolation for 5 days as we discussed unless you require medical attention.  Please return for any new, worsening, or change in symptoms or other concerns.  Was a pleasure caring for you today.

## 2022-07-24 NOTE — ED Provider Notes (Signed)
[facility] Provider Note    Event Date/Time   First MD Initiated Contact with Patient 07/24/22 1103     (approximate)   History   Back Pain and Nasal Congestion   HPI  [name] is a 52 y.o. female who presents today for evaluation of cough, body aches, nasal congestion, and sneezing for the past several days.  She reports that she took a home COVID test which was negative.  She denies chest pain or shortness of breath.  No abdominal pain, nausea, vomiting, or diarrhea.  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   Spinal stenosis in cervical region 01/17/2017   Chronic low back pain without sciatica 06/22/2016   Strain of left trapezius muscle 06/22/2016   Allergic rhinitis due to allergen 03/26/2016   Hyperlipidemia associated with type 2 diabetes mellitus ([place]) 03/15/2016   Hypertension 02/09/2016   Type 2 diabetes mellitus with other specified complication ([place]) 99991111   DJD (degenerative joint disease) of cervical spine 02/09/2016   Obesity (BMI 30.0-34.9) 02/09/2016   Asthma, mild intermittent 02/09/2016   Sialoadenitis 02/28/2003          Physical Exam   Triage Vital Signs: ED Triage Vitals  Enc Vitals Group     BP 07/24/22 1050 128/72     Pulse Rate 07/24/22 1050 80     Resp 07/24/22 1050 16     Temp 07/24/22 1050 98.1 F (36.7 C)     Temp Source 07/24/22 1050 Oral     SpO2 07/24/22 1050 97 %     Weight 07/24/22 1051 132 lb 4.4 oz (60 kg)     Height 07/24/22 1051 4' 9"$  (1.448 m)     Head Circumference --      Peak Flow --      Pain Score 07/24/22 1051 4     Pain Loc --      Pain Edu? --      Excl. in [place]? --     Most  recent vital signs: Vitals:   07/24/22 1050  BP: 128/72  Pulse: 80  Resp: 16  Temp: 98.1 F (36.7 C)  SpO2: 97%    Physical Exam Vitals and nursing note reviewed.  Constitutional:      General: Awake and alert. No acute distress.    Appearance: Normal appearance. The patient is normal weight.  HENT:     Head: Normocephalic and atraumatic.     Mouth: Mucous membranes are moist.  Nasal congestion present Eyes:     General: PERRL. Normal EOMs        Right eye: No discharge.        Left eye: No discharge.     Conjunctiva/sclera: Conjunctivae normal.  Cardiovascular:     Rate and Rhythm: Normal rate and regular rhythm.     Pulses: Normal pulses.     Heart sounds: Normal heart sounds Pulmonary:     Effort: Pulmonary effort is normal. No respiratory distress.     Breath sounds: Normal breath sounds.  Abdominal:     Abdomen is soft. There is no abdominal tenderness. No rebound or guarding. No distention. Musculoskeletal:        General: No  swelling. Normal range of motion.     Cervical back: Normal range of motion and neck supple.  Skin:    General: Skin is warm and dry.     Capillary Refill: Capillary refill takes less than 2 seconds.     Findings: No rash.  Neurological:     Mental Status: The patient is awake and alert.      ED Results / Procedures / Treatments   Labs (all labs ordered are listed, but only abnormal results are displayed) Labs Reviewed  RESP PANEL BY RT-PCR (RSV, FLU A&B, COVID)  [id number] - Abnormal; Notable for the following components:      Result Value   SARS Coronavirus 2 by RT PCR POSITIVE (*)    All other components within normal limits     EKG     RADIOLOGY     PROCEDURES:  Critical Care performed:   Procedures   MEDICATIONS ORDERED IN ED: Medications - No data to display   IMPRESSION / MDM / [place] / ED COURSE  I reviewed the triage vital signs and the nursing notes.   Differential diagnosis includes, but  is not limited to, COVID, flu, RSV, pneumonia, bronchitis.  Patient is awake and alert, hemodynamically stable and afebrile.  She demonstrates no increased work of breathing.  She is nontoxic in appearance.  She has normal lung sounds bilaterally and a normal oxygen saturation on room air.  I do not suspect pneumonia.  COVID/flu/RSV swab obtained in triage is positive for COVID-19.  Patient was made aware of this result.  She was encouraged to remain in isolation unless she requires medical attention as recommended by CDC guidelines.  She was given a work note.  We discussed the option of Paxlovid but she prefers to take Tylenol/ibuprofen which I feel is appropriate.  We discussed return precautions and the importance of close outpatient follow-up.  Patient understands and agrees with plan.  She was discharged in stable condition.   Patient's presentation is most consistent with acute complicated illness / injury requiring diagnostic workup.     FINAL CLINICAL IMPRESSION(S) / ED DIAGNOSES   Final diagnoses:  [id number]     Rx / DC Orders   ED Discharge Orders     None        Note:  This document was prepared using Dragon voice recognition software and may include unintentional dictation errors.   Emeline Gins 07/24/22 1314    Arta Silence, MD 07/24/22 1541

## 2022-10-07 ENCOUNTER — Emergency Department
Admission: EM | Admit: 2022-10-07 | Discharge: 2022-10-07 | Disposition: A | Discharge status: Home / Self Care | Payer: Medicaid Other | Attending: Emergency Medicine | Admitting: Emergency Medicine

## 2022-10-07 ENCOUNTER — Other Ambulatory Visit: Payer: Self-pay

## 2022-10-07 DIAGNOSIS — M545 Low back pain, unspecified: Secondary | ICD-10-CM

## 2022-10-07 DIAGNOSIS — I10 Essential (primary) hypertension: Secondary | ICD-10-CM | POA: Diagnosis not present

## 2022-10-07 DIAGNOSIS — G8929 Other chronic pain: Secondary | ICD-10-CM | POA: Insufficient documentation

## 2022-10-07 DIAGNOSIS — J45909 Unspecified asthma, uncomplicated: Secondary | ICD-10-CM | POA: Diagnosis not present

## 2022-10-07 DIAGNOSIS — E119 Type 2 diabetes mellitus without complications: Secondary | ICD-10-CM | POA: Diagnosis not present

## 2022-10-07 DIAGNOSIS — M546 Pain in thoracic spine: Secondary | ICD-10-CM | POA: Insufficient documentation

## 2022-10-07 MED ORDER — IBUPROFEN 400 MG PO TABS
400.0000 mg | ORAL_TABLET | Freq: Once | ORAL | Status: AC
  Administered 2022-10-07: 400 mg via ORAL
  Filled 2022-10-07: qty 1

## 2022-10-07 MED ORDER — LIDOCAINE 5 % EX PTCH: 1.0000 | MEDICATED_PATCH | Freq: Two times a day (BID) | CUTANEOUS | 0 refills | Status: AC

## 2022-10-07 MED ORDER — LIDOCAINE 5 % EX PTCH
1.0000 | MEDICATED_PATCH | CUTANEOUS | Status: DC
  Administered 2022-10-07: 1 via TRANSDERMAL
  Filled 2022-10-07: qty 1

## 2022-10-07 NOTE — ED Provider Notes (Signed)
[facility] Provider Note    Event Date/Time   First MD Initiated Contact with Patient 10/07/22 [phone]     (approximate)   History   Chief Complaint Back Pain   HPI  Cheryl Roberts is a 52 y.o. female with past medical history of hypertension, diabetes, and asthma who presents to the ED complaining of back pain.  Patient reports that she has been dealing with constant pain in the middle of her lower back for the past couple of months.  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  She reports taking meloxicam and Flexeril without significant relief, followed up with her spine specialist at [facility] and was referred to physical therapy.  She denies any numbness or weakness in her lower extremities, has not had any numbness in her groin or any incontinence.  She has been able to walk without difficulty, denies any fevers or urinary symptoms.     Physical Exam   Triage Vital Signs: ED Triage Vitals  Enc Vitals Group     BP 10/07/22 0731 (!) 151/76     Pulse Rate 10/07/22 0731 81     Resp 10/07/22 0731 16     Temp 10/07/22 0731 98.3 F (36.8 C)     Temp src --      SpO2 10/07/22 0731 99 %     Weight 10/07/22 0733 136 lb (61.7 kg)     Height 10/07/22 0733 4\' 9"  (1.448 m)     Head Circumference --      Peak Flow --      Pain Score 10/07/22 0732 8     Pain Loc --      Pain Edu? --      Excl. in [place]? --     Most recent vital signs: Vitals:   10/07/22 0731  BP: (!) 151/76  Pulse: 81  Resp: 16  Temp: 98.3 F (36.8 C)  SpO2: 99%    Constitutional: Alert and oriented. Eyes: Conjunctivae are normal. Head: Atraumatic. Nose: No congestion/rhinnorhea. Mouth/Throat: Mucous membranes are moist.  Cardiovascular: Normal rate, regular rhythm. Grossly normal heart sounds.  2+ radial and DP pulses bilaterally. Respiratory: Normal respiratory effort.   No retractions. Lungs CTAB. Gastrointestinal: Soft and nontender.  No CVA tenderness bilaterally.  No distention. Musculoskeletal: No lower extremity tenderness nor edema.  Midline lumbar spinal tenderness to palpation noted. Neurologic:  Normal speech and language. No gross focal neurologic deficits are appreciated.    ED Results / Procedures / Treatments   Labs (all labs ordered are listed, but only abnormal results are displayed) Labs Reviewed - No data to display   PROCEDURES:  Critical Care performed: No  Procedures   MEDICATIONS ORDERED IN ED: Medications  ibuprofen (ADVIL) tablet 400 mg (has no administration in time range)  lidocaine (LIDODERM) 5 % 1 patch (has no administration in time range)     IMPRESSION / MDM / ASSESSMENT AND PLAN / ED COURSE  I reviewed the triage vital signs and the nursing notes.                              52 y.o. female with past medical history of hypertension, diabetes, and asthma who presents to the ED complaining of 2 months of midline lumbar spinal pain without acute trauma.  Patient's presentation is most consistent with acute, uncomplicated illness.  Differential diagnosis includes, but is not limited to, cauda equina, lumbar strain, lumbar radiculopathy, chronic back pain.  Patient well-appearing and in no acute distress, vital signs are unremarkable.  She is neurovascular intact to her bilateral lower extremities, no findings concerning for cauda equina.  No recent trauma to necessitate imaging and patient is appropriate for outpatient management of acute on chronic back pain.  She was offered Toradol but declines, will treat with ibuprofen as patient states there is no chance she could be pregnant.  We will also start her on Lidoderm patches in addition to the NSAIDs and muscle relaxants she has been taking at home.  She was counseled to follow-up with physical therapy and her spine specialist at [facility], otherwise return to the ED for  new or worsening symptoms.  Patient agrees with plan.      FINAL CLINICAL IMPRESSION(S) / ED DIAGNOSES   Final diagnoses:  Chronic midline low back pain without sciatica     Rx / DC Orders   ED Discharge Orders          Ordered    lidocaine (LIDODERM) 5 %  Every 12 hours        10/07/22 0750             Note:  This document was prepared using Dragon voice recognition software and may include unintentional dictation errors.   Chesley Noon, MD 10/07/22 [phone]

## 2022-10-07 NOTE — ED Triage Notes (Signed)
Pt to ED for back pain, states has degenerative disc disease. Also c/o right knee pain for "a good while". Denies injuries. Ambulatory to triage.

## 2022-11-25 ENCOUNTER — Other Ambulatory Visit: Payer: Self-pay

## 2022-11-25 ENCOUNTER — Emergency Department
Admission: EM | Admit: 2022-11-25 | Discharge: 2022-11-25 | Disposition: A | Discharge status: Home / Self Care | Payer: Medicaid Other | Attending: Emergency Medicine | Admitting: Emergency Medicine

## 2022-11-25 DIAGNOSIS — H9201 Otalgia, right ear: Secondary | ICD-10-CM | POA: Diagnosis present

## 2022-11-25 DIAGNOSIS — H6991 Unspecified Eustachian tube disorder, right ear: Secondary | ICD-10-CM | POA: Insufficient documentation

## 2022-11-25 DIAGNOSIS — J45909 Unspecified asthma, uncomplicated: Secondary | ICD-10-CM | POA: Diagnosis not present

## 2022-11-25 DIAGNOSIS — I10 Essential (primary) hypertension: Secondary | ICD-10-CM | POA: Insufficient documentation

## 2022-11-25 DIAGNOSIS — E119 Type 2 diabetes mellitus without complications: Secondary | ICD-10-CM | POA: Diagnosis not present

## 2022-11-25 MED ORDER — PREDNISONE 10 MG PO TABS: ORAL_TABLET | ORAL | 0 refills | Status: AC

## 2022-11-25 MED ORDER — FLUTICASONE PROPIONATE 50 MCG/ACT NA SUSP: 2.0000 | Freq: Every day | NASAL | 1 refills | Status: AC

## 2022-11-25 NOTE — ED Triage Notes (Signed)
Pt presents to ED with c/o of R ear pain that started a few days ago. Pt denies fevers or chills. NAD noted.

## 2022-11-25 NOTE — ED Provider Notes (Signed)
[facility] Provider Note    Event Date/Time   First MD Initiated Contact with Patient 11/25/22 [phone]     (approximate)   History   Otalgia   HPI  Cheryl Roberts is a 52 y.o. female   presents to the ED with complaint of right ear pain for approximately 2 weeks.  Patient denies any fever, chills, upper respiratory symptoms.  She reports it feels like there is fluid in her ear with decreased hearing.  She has a history of hypertension, diabetes, asthma      Physical Exam   Triage Vital Signs: ED Triage Vitals  Enc Vitals Group     BP 11/25/22 0827 124/69     Pulse Rate 11/25/22 0827 64     Resp 11/25/22 0827 16     Temp 11/25/22 0827 98.3 F (36.8 C)     Temp src --      SpO2 11/25/22 0827 100 %     Weight --      Height --      Head Circumference --      Peak Flow --      Pain Score 11/25/22 0824 5     Pain Loc --      Pain Edu? --      Excl. in [place]? --     Most recent vital signs: Vitals:   11/25/22 0827  BP: 124/69  Pulse: 64  Resp: 16  Temp: 98.3 F (36.8 C)  SpO2: 100%     General: Awake, no distress.  CV:  Good peripheral perfusion.  Resp:  Normal effort.  Abd:  No distention.  Other:  Left EAC clear, TM is dull.  Right EAC minimal cerumen.  TM is dull with poor light reflex.  No erythema or injection is noted.  Questionable fluid level.   ED Results / Procedures / Treatments   Labs (all labs ordered are listed, but only abnormal results are displayed) Labs Reviewed - No data to display    PROCEDURES:  Critical Care performed:   Procedures   MEDICATIONS ORDERED IN ED: Medications - No data to display   IMPRESSION / MDM / ASSESSMENT AND PLAN / ED COURSE  I reviewed the triage vital signs and the nursing notes.   Differential diagnosis includes, but is not limited to, otitis media, otitis externa, serous otitis, eustachian tube dysfunction.  53 year old female presents to the ED with complaint of right  otalgia for approximately 2 weeks without any other symptoms.  Physical exam is more in keeping with a eustachian tube dysfunction and patient was made aware.  A prescription for prednisone was sent to the pharmacy along with Flonase nasal spray.  Patient is instructed to follow-up with [facility] if not improving or continued problems with her right ear.      Patient's presentation is most consistent with acute, uncomplicated illness.  FINAL CLINICAL IMPRESSION(S) / ED DIAGNOSES   Final diagnoses:  Eustachian tube dysfunction, right     Rx / DC Orders   ED Discharge Orders          Ordered    predniSONE (DELTASONE) 10 MG tablet        11/25/22 0914    fluticasone (FLONASE) 50 MCG/ACT nasal spray  Daily        11/25/22 0914             Note:  This document was prepared using Dragon voice recognition software and may include unintentional  dictation errors.   Tommi Rumps, PA-C 11/25/22 [id number]    Chesley Noon, MD 11/25/22 1540

## 2022-11-25 NOTE — ED Notes (Signed)
See triage note  Presents with right ear pain  States sx's started 3 days ago  Unsure of fever and is currently afebrile

## 2022-11-25 NOTE — Discharge Instructions (Addendum)
Follow-up with your primary care provider or Dr. Elenore Rota who is on-call for [place] [facility] nose [facility] throat. 2 medications were sent to the pharmacy 1 is prednisone that you will begin today starting with 4 tablets and Flonase nasal spray 2 sprays in each nostril 1 time a day.  You may also take Tylenol with this medication if needed.  During the time that you are taking the prednisone you may see a temporary rise in your blood sugar.

## 2023-01-24 ENCOUNTER — Other Ambulatory Visit: Payer: Self-pay

## 2023-01-24 ENCOUNTER — Emergency Department
Admission: EM | Admit: 2023-01-24 | Discharge: 2023-01-24 | Disposition: A | Discharge status: Home / Self Care | Payer: Medicaid Other | Attending: Emergency Medicine | Admitting: Emergency Medicine

## 2023-01-24 DIAGNOSIS — M545 Low back pain, unspecified: Secondary | ICD-10-CM | POA: Diagnosis present

## 2023-01-24 DIAGNOSIS — I10 Essential (primary) hypertension: Secondary | ICD-10-CM | POA: Insufficient documentation

## 2023-01-24 DIAGNOSIS — M5442 Lumbago with sciatica, left side: Secondary | ICD-10-CM | POA: Diagnosis not present

## 2023-01-24 DIAGNOSIS — E119 Type 2 diabetes mellitus without complications: Secondary | ICD-10-CM | POA: Diagnosis not present

## 2023-01-24 NOTE — Discharge Instructions (Signed)
Schedule an appointment with your spine doctor to discuss steroid injections.  Please take the meloxicam once a day for 2 weeks.  You can continue to take the arthritis Tylenol medication as needed.  Please read the attached information about plantar fasciitis, I believe this is what is causing your foot pain.

## 2023-01-24 NOTE — ED Triage Notes (Signed)
Pt comes with c/o left sided back pain that radiates down her leg. Pt state she has bulging disc.

## 2023-01-24 NOTE — ED Provider Notes (Signed)
[facility] Provider Note    Event Date/Time   First MD Initiated Contact with Patient 01/24/23 1715     (approximate)   History   Back Pain   HPI  Cheryl Roberts is a 52 y.o. female   PMH of diabetes, hypertension and chronic bilateral low back pain with sciatica presents for evaluation of back pain.  Patient states she has low back pain on the left side that runs down the back of her leg all the way to her foot.  She reports that she has some occasional numbness and tingling.  Patient also describes pain on the bottom of her foot near her arch.  I reviewed patient's chart and she is followed at the [facility] spine center.  She recently had an MRI that showed a small left-sided disc bulge at L5-S1 abutting the left L5 nerve.  She was offered lumbar epidural steroid injections but declined at that time.  She states she was recently off work for 3 months for medical leave but began working about 2 weeks ago.  She says her symptoms have been increasing since returning to work.   Physical Exam   Triage Vital Signs: ED Triage Vitals  Encounter Vitals Group     BP 01/24/23 1627 (!) 113/50     Systolic BP Percentile --      Diastolic BP Percentile --      Pulse Rate 01/24/23 1627 82     Resp 01/24/23 1627 18     Temp 01/24/23 1627 98.1 F (36.7 C)     Temp src --      SpO2 01/24/23 1627 97 %     Weight --      Height --      Head Circumference --      Peak Flow --      Pain Score 01/24/23 1626 5     Pain Loc --      Pain Education --      Exclude from Growth Chart --     Most recent vital signs: Vitals:   01/24/23 1627  BP: (!) 113/50  Pulse: 82  Resp: 18  Temp: 98.1 F (36.7 C)  SpO2: 97%    General: Awake, no distress.  CV:  Good peripheral perfusion.  Resp:  Normal effort.  Abd:  No distention.  Other:  Tender to palpation along the left lower back, 5/5 strength in the left lower extremity, dorsalis pedis pulses 2+ and regular, sensation  intact across all dermatomes, capillary refill appropriate, Achilles tendon reflex 2+   ED Results / Procedures / Treatments   Labs (all labs ordered are listed, but only abnormal results are displayed) Labs Reviewed - No data to display   PROCEDURES:  Critical Care performed: No  Procedures   MEDICATIONS ORDERED IN ED: Medications - No data to display   IMPRESSION / MDM / ASSESSMENT AND PLAN / ED COURSE  I reviewed the triage vital signs and the nursing notes.                              Differential diagnosis includes, but is not limited to, sciatica, diabetic neuropathy,  muscle strain, lumbar radiculopathy  Patient's presentation is most consistent with acute, uncomplicated illness.  Do not feel that imaging is warranted at this time as patient has not had any falls or new injury.  She has already had an MRI completed.  I advised patient to follow-up with the spine center as it may be time to consider epidural steroid injections given her worsening symptoms.  In the meantime I advised patient to take the meloxicam she has already been prescribed for 2 weeks.  She can continue to take the Tylenol arthritis medication.  I believe the pain on the bottom of her foot is from plantar fasciitis.  I explained why this occurs and advised her to try stretching her foot on a tennis ball.  I also explained that the medications she is taking for her back will help with her foot pain.  Patient was agreeable to plan, all questions were answered and she was stable at discharge.      FINAL CLINICAL IMPRESSION(S) / ED DIAGNOSES   Final diagnoses:  Acute left-sided low back pain with left-sided sciatica     Rx / DC Orders   ED Discharge Orders     None        Note:  This document was prepared using Dragon voice recognition software and may include unintentional dictation errors.   Cameron Ali, PA-C 01/24/23 1814    Chesley Noon, MD 01/24/23 [phone]

## 2023-02-14 ENCOUNTER — Emergency Department
Admission: EM | Admit: 2023-02-14 | Discharge: 2023-02-14 | Disposition: A | Discharge status: Home / Self Care | Payer: Medicaid Other | Attending: Emergency Medicine | Admitting: Emergency Medicine

## 2023-02-14 ENCOUNTER — Other Ambulatory Visit: Payer: Self-pay

## 2023-02-14 DIAGNOSIS — H748X1 Other specified disorders of right middle ear and mastoid: Secondary | ICD-10-CM | POA: Diagnosis not present

## 2023-02-14 DIAGNOSIS — J45909 Unspecified asthma, uncomplicated: Secondary | ICD-10-CM | POA: Insufficient documentation

## 2023-02-14 DIAGNOSIS — E119 Type 2 diabetes mellitus without complications: Secondary | ICD-10-CM | POA: Diagnosis not present

## 2023-02-14 DIAGNOSIS — I10 Essential (primary) hypertension: Secondary | ICD-10-CM | POA: Insufficient documentation

## 2023-02-14 DIAGNOSIS — H9201 Otalgia, right ear: Secondary | ICD-10-CM | POA: Diagnosis present

## 2023-02-14 DIAGNOSIS — H65191 Other acute nonsuppurative otitis media, right ear: Secondary | ICD-10-CM

## 2023-02-14 MED ORDER — CHLORPHENIRAMINE MALEATE 4 MG PO TABS: 4.0000 mg | ORAL_TABLET | Freq: Two times a day (BID) | ORAL | 0 refills | Status: DC | PRN

## 2023-02-14 NOTE — ED Triage Notes (Signed)
Pt here with right ear pain. Pt states she had a dull ache, denies drainage or injury.

## 2023-02-14 NOTE — ED Provider Notes (Signed)
[facility] Provider Note   Event Date/Time   First MD Initiated Contact with Patient 02/14/23 1349     (approximate) History  Ear Pain  HPI Cheryl Roberts is a 52 y.o. female with a stated past medical history of asthma, hypertension, diabetes, chronic sinusitis, and recurrent right ear infections who presents complaining of right ear pain and the feeling of fullness.  Patient states that she occasionally feels as though she is hearing underwater on the side.  Patient denies any throat pain, fever ROS: Patient currently denies any vision changes, tinnitus, difficulty speaking, facial droop, sore throat, chest pain, shortness of breath, abdominal pain, nausea/vomiting/diarrhea, dysuria, or weakness/numbness/paresthesias in any extremity   Physical Exam  Triage Vital Signs: ED Triage Vitals  Encounter Vitals Group     BP 02/14/23 1217 117/70     Systolic BP Percentile --      Diastolic BP Percentile --      Pulse Rate 02/14/23 1217 67     Resp 02/14/23 1217 16     Temp 02/14/23 1217 98.4 F (36.9 C)     Temp Source 02/14/23 1217 Oral     SpO2 02/14/23 1217 98 %     Weight 02/14/23 1216 136 lb 0.4 oz (61.7 kg)     Height 02/14/23 1216 4\' 9"  (1.448 m)     Head Circumference --      Peak Flow --      Pain Score 02/14/23 1216 10     Pain Loc --      Pain Education --      Exclude from Growth Chart --    Most recent vital signs: Vitals:   02/14/23 1217  BP: 117/70  Pulse: 67  Resp: 16  Temp: 98.4 F (36.9 C)  SpO2: 98%   General: Awake, oriented x4. CV:  Good peripheral perfusion.  Resp:  Normal effort.  Abd:  No distention.  Other:  Middle-aged overweight African-American female resting comfortably in no acute distress.  Right ear showing effusion without erythema or bulging ED Results / Procedures / Treatments  Labs (all labs ordered are listed, but only abnormal results are displayed) Labs Reviewed - No data to  display  PROCEDURES: Critical Care performed: No Procedures MEDICATIONS ORDERED IN ED: Medications - No data to display IMPRESSION / MDM / ASSESSMENT AND PLAN / ED COURSE  I reviewed the triage vital signs and the nursing notes.                             The patient is on the cardiac monitor to evaluate for evidence of arrhythmia and/or significant heart rate changes. Patient's presentation is most consistent with acute presentation with potential threat to life or bodily function. Otherwise healthy patient presenting with constellation of symptoms likely representing right middle ear effusion without infection  Unlikely PTA/RPA: no hot potato voice, no uvular deviation, Unlikely Esophageal rupture: No history of dysphagia Unlikely deep space infection/Ludwig's Low suspicion for CNS infection bacterial sinusitis, or pneumonia given exam and history.  Unlikely Strep or EBV as centor negative and with no pharyngeal exudate, posterior LAD, or splenomegaly.  Will attempt to alleviate symptoms conservatively; no overt indications at this time for antibiotics. No respiratory distress, otherwise relatively well appearing and nontoxic. Will discuss prompt follow up with PMD and strict return precautions.   FINAL CLINICAL IMPRESSION(S) / ED DIAGNOSES   Final diagnoses:  Right ear pain  Acute middle ear effusion, right   Rx / DC Orders   ED Discharge Orders          Ordered    chlorpheniramine (CHLOR-TRIMETON) 4 MG tablet  2 times daily PRN        02/14/23 1359           Note:  This document was prepared using Dragon voice recognition software and may include unintentional dictation errors.   Merwyn Katos, MD 02/14/23 2005

## 2023-02-16 ENCOUNTER — Other Ambulatory Visit: Payer: Self-pay

## 2023-02-16 ENCOUNTER — Encounter: Payer: Self-pay | Admitting: Emergency Medicine

## 2023-02-16 ENCOUNTER — Emergency Department
Admission: EM | Admit: 2023-02-16 | Discharge: 2023-02-16 | Disposition: A | Discharge status: Home / Self Care | Payer: Medicaid Other | Attending: Emergency Medicine | Admitting: Emergency Medicine

## 2023-02-16 DIAGNOSIS — H6504 Acute serous otitis media, recurrent, right ear: Secondary | ICD-10-CM | POA: Insufficient documentation

## 2023-02-16 DIAGNOSIS — H9201 Otalgia, right ear: Secondary | ICD-10-CM | POA: Diagnosis present

## 2023-02-16 MED ORDER — AMOXICILLIN-POT CLAVULANATE 875-125 MG PO TABS: 1.0000 | ORAL_TABLET | Freq: Two times a day (BID) | ORAL | 0 refills | Status: AC

## 2023-02-16 NOTE — ED Triage Notes (Signed)
Pt states coming in with right ear pain. Pt was here on Friday, and was discharged. Pt states the pain is not better and would like some antibiotics.

## 2023-02-16 NOTE — ED Provider Notes (Signed)
[facility] Provider Note    Event Date/Time   First MD Initiated Contact with Patient 02/16/23 1200     (approximate)   History   Otalgia (Right)   HPI Cheryl Roberts is a 52 y.o. female presenting today for right ear pain.  Patient has a prior history of recurrent right ear infections and has seen ENT in the past.  She was seen in the emergency department a couple days ago for similar symptoms but discharged without antibiotics with no obvious infection at that time.  She reports ongoing pain symptoms in the right ear.  Denies hearing loss, vision changes, dizziness, fever, chills, pain to the outside of the ear or around the head.  No neck pain.     Physical Exam   Triage Vital Signs: ED Triage Vitals [02/16/23 1129]  Encounter Vitals Group     BP 124/85     Systolic BP Percentile      Diastolic BP Percentile      Pulse Rate 77     Resp 17     Temp 98.5 F (36.9 C)     Temp src      SpO2 100 %     Weight 136 lb 11.2 oz (62 kg)     Height 4\' 8"  (1.422 m)     Head Circumference      Peak Flow      Pain Score 10     Pain Loc      Pain Education      Exclude from Growth Chart     Most recent vital signs: Vitals:   02/16/23 1129  BP: 124/85  Pulse: 77  Resp: 17  Temp: 98.5 F (36.9 C)  SpO2: 100%   I have reviewed the vital signs. General:  Awake, alert, no acute distress. Head:  Normocephalic, Atraumatic. EENT:  PERRL, EOMI, Oral mucosa pink and moist, Neck is supple.  Left ear with normal TM without bulging or erythema.  Right ear with serous fluid behind the eardrum along with scant erythema surrounding.  No bulging to the TM. Cardiovascular: Regular rate, 2+ distal pulses. Respiratory:  Normal respiratory effort, symmetrical expansion, no distress.   Extremities:  Moving all four extremities through full ROM without pain.   Neuro:  Alert and oriented.  Interacting appropriately.   Skin:  Warm, dry, no rash.   Psych: Appropriate  affect.     ED Results / Procedures / Treatments   Labs (all labs ordered are listed, but only abnormal results are displayed) Labs Reviewed - No data to display   EKG    RADIOLOGY    PROCEDURES:  Critical Care performed: No  Procedures   MEDICATIONS ORDERED IN ED: Medications - No data to display   IMPRESSION / MDM / ASSESSMENT AND PLAN / ED COURSE  I reviewed the triage vital signs and the nursing notes.                              Differential diagnosis includes, but is not limited to, acute otitis media, otitis externa.  Patient's presentation is most consistent with acute, uncomplicated illness.  Patient is a 52 year old female with recurrent ear infections presenting today for right-sided ear pain.  Exam is equivocal for possible right-sided otitis media but given ongoing symptoms after being seen in the ED couple days ago, will initiate antibiotic treatment at this time.  Patient states she will  follow-up with ENT outpatient for ongoing evaluation.  No pain over the mastoid process, no pain to the external ear canal, no erythema to the external ear canal, no extra drainage.  Patient safer discharge and given strict return precautions.   FINAL CLINICAL IMPRESSION(S) / ED DIAGNOSES   Final diagnoses:  Recurrent acute serous otitis media of right ear     Rx / DC Orders   ED Discharge Orders          Ordered    amoxicillin-clavulanate (AUGMENTIN) 875-125 MG tablet  2 times daily        02/16/23 1222             Note:  This document was prepared using Dragon voice recognition software and may include unintentional dictation errors.   Janith Lima, MD 02/16/23 1225

## 2023-02-16 NOTE — Discharge Instructions (Signed)
You were seen in the emergency department today for your right ear pain.  I have sent antibiotic to your pharmacy to take to help with symptoms.  Please follow-up with ENT for ongoing evaluation of chronic ear infections.

## 2023-02-17 NOTE — Group Note (Deleted)

## 2023-02-18 ENCOUNTER — Telehealth: Payer: Self-pay | Admitting: Emergency Medicine

## 2023-02-18 NOTE — Telephone Encounter (Signed)
Pateitn called and left message saying she is having diarrhea.  I called her back after speaking with dr wells.  I told her that she should try to continue the antibiotic, but that antibiotics often cause diarrhea.  I told her to go to pharmacy and speak with pharmacist about taking antidiarrheals.  I told her that it was important that she follow up with ent or her pcp to recheck her ear.  She says she has appt on the 17th with pcp.

## 2023-06-18 ENCOUNTER — Other Ambulatory Visit: Payer: Self-pay

## 2023-06-18 ENCOUNTER — Emergency Department
Admission: EM | Admit: 2023-06-18 | Discharge: 2023-06-18 | Disposition: A | Discharge status: Home / Self Care | Payer: Medicaid Other | Attending: Emergency Medicine | Admitting: Emergency Medicine

## 2023-06-18 DIAGNOSIS — Z7982 Long term (current) use of aspirin: Secondary | ICD-10-CM | POA: Diagnosis not present

## 2023-06-18 DIAGNOSIS — R040 Epistaxis: Secondary | ICD-10-CM | POA: Insufficient documentation

## 2023-06-18 NOTE — ED Provider Notes (Signed)
 [place] EMERGENCY DEPARTMENT AT [facility] Provider Note   CSN: [id number] Arrival date & time: 06/18/23  1730     History  Chief Complaint  Patient presents with   Epistaxis    Thereasa GRAHAM Roberts is a 53 y.o. female presents to [facility] emerged part for evaluation of nosebleed.  Several hours ago patient noticed left-sided nasal bleeding with no known trauma or injury.  Patient states she blew her nose and developed some bleeding.  This lasted approximately 30 minutes and then bleeding stopped.  She has not had any active bleeding in Cheryl throat or in [facility] nose.  She denies being on blood thinners.  No trauma or injury.  No other bleeding.    HPI     Home Medications Prior to Admission medications   Medication Sig Start Date End Date Taking? Authorizing Provider  albuterol  (PROVENTIL  HFA;VENTOLIN  HFA) 108 (90 Base) MCG/ACT inhaler Inhale 2 puffs into Cheryl lungs every 6 (six) hours as needed for wheezing or shortness of breath. 08/22/18   Triplett, Kirk B, FNP  aspirin  81 MG tablet Take 1 tablet (81 mg total) by mouth daily. 03/15/16   Karamalegos, Marsa PARAS, DO  atorvastatin  (LIPITOR) 40 MG tablet Take 1 tablet (40 mg total) by mouth daily. 03/17/17   Karamalegos, Marsa PARAS, DO  Blood Glucose Monitoring Suppl (TRUE METRIX METER) w/Device KIT 1 each by Does not apply route as directed. 05/29/16   Karamalegos, Marsa PARAS, DO  chlorpheniramine  (CHLOR-TRIMETON ) 4 MG tablet Take 1 tablet (4 mg total) by mouth 2 (two) times daily as needed (congestion, ear pain). 02/14/23   Bradler, Evan K, MD  fluticasone  (FLONASE ) 50 MCG/ACT nasal spray Place 2 sprays into both nostrils daily. 11/25/22 11/25/23  Saunders Givens L, PA-C  glucose blood (TRUE METRIX BLOOD GLUCOSE TEST) test strip Check blood sugar twice daily. Dx E11.9 05/29/16   Edman Marsa PARAS, DO  hydrochlorothiazide  (HYDRODIURIL ) 25 MG tablet TAKE ONE TABLET BY MOUTH EVERY DAY 01/24/18   Karamalegos, Marsa PARAS, DO   Ipratropium-Albuterol  (COMBIVENT) 20-100 MCG/ACT AERS respimat Inhale 1 puff into Cheryl lungs every 6 (six) hours as needed for wheezing or shortness of breath. 08/22/18   Triplett, Kirk B, FNP  LANCETS ULTRA FINE MISC 1 each by Does not apply route 2 (two) times daily. Check blood sugar twice daily. Dx: E11.9 05/29/16   Edman Marsa PARAS, DO  lidocaine  (LIDODERM ) 5 % Place 1 patch onto Cheryl skin every 12 (twelve) hours. Remove & Discard patch within 12 hours or as directed by MD 10/07/22 10/07/23  Willo Dunnings, MD  lisinopril  (PRINIVIL ,ZESTRIL ) 10 MG tablet TAKE ONE TABLET BY MOUTH EVERY DAY 01/24/18   Edman, Marsa PARAS, DO  loratadine  (CLARITIN ) 10 MG tablet Take 1 tablet (10 mg total) by mouth daily. 03/26/16   Karamalegos, Marsa PARAS, DO  metFORMIN  (GLUCOPHAGE -XR) 750 MG 24 hr tablet TAKE ONE TABLET BY MOUTH EVERY DAY WITH BREAKFAST 09/10/17   Karamalegos, Marsa PARAS, DO  predniSONE  (DELTASONE ) 10 MG tablet Take 4 tablets x 2 days, 3 tabs for 2 days and 1 tab for 2 days 11/25/22   Saunders Givens CROME, PA-C      Allergies    Amoxicillin     Review of Systems   Review of Systems  Physical Exam Updated Vital Signs BP (!) 175/81   Pulse 79   Temp 98 F (36.7 C)   Resp 17   Ht 4' 11 (1.499 m)   Wt 61.7 kg   SpO2 100%  BMI 27.47 kg/m  Physical Exam Constitutional:      Appearance: She is well-developed.  HENT:     Head: Normocephalic and atraumatic.     Nose: Nose normal.     Comments: Left and right nare appears well.  No active bleeding.  No abnormal soft tissue mass or foreign body.  No pharyngeal abnormalities.  No active bleeding along [facility]yl posterior pharyngeal region.    Mouth/Throat:     Mouth: Mucous membranes are moist.     Pharynx: No oropharyngeal exudate or posterior oropharyngeal erythema.  Eyes:     Conjunctiva/sclera: Conjunctivae normal.  Cardiovascular:     Rate and Rhythm: Normal rate.  Pulmonary:     Effort: Pulmonary effort is normal. No  respiratory distress.  Musculoskeletal:        General: Normal range of motion.     Cervical back: Normal range of motion.  Skin:    General: Skin is warm.     Findings: No rash.  Neurological:     General: No focal deficit present.     Mental Status: She is alert and oriented to person, place, and time.  Psychiatric:        Behavior: Behavior normal.        Thought Content: Thought content normal.     ED Results / Procedures / Treatments   Labs (all labs ordered are listed, but only abnormal results are displayed) Labs Reviewed - No data to display  EKG None  Radiology No results found.  Procedures Procedures    Medications Ordered in ED Medications - No data to display  ED Course/ Medical Decision Making/ A&P                                 Medical Decision Making  53 year old with left-sided epistasis, nontraumatic.  Bleeding has stopped while here in [facility] ED.  She has no signs of bleeding on exam.  Recommend nasal saline mist for moisturization.  She is educated on self treatment at home if bleeding continues and as well as signs and symptoms return to [facility] ED for. Final Clinical Impression(s) / ED Diagnoses Final diagnoses:  Epistaxis  Left-sided epistaxis    Rx / DC Orders ED Discharge Orders     None         Cheryl Debby JAYSON DEVONNA 06/18/23 2057    Dorothyann Drivers, MD 06/23/23 2258

## 2023-06-18 NOTE — ED Triage Notes (Addendum)
 Pt to ED for nose bleeding started 15 mins ago when blowing nose. No active bleeding noted.  States nose has been dry

## 2023-09-01 ENCOUNTER — Other Ambulatory Visit: Payer: Self-pay

## 2023-09-01 ENCOUNTER — Encounter: Payer: Self-pay | Admitting: Emergency Medicine

## 2023-09-01 ENCOUNTER — Emergency Department
Admission: EM | Admit: 2023-09-01 | Discharge: 2023-09-01 | Disposition: A | Discharge status: Home / Self Care | Attending: Emergency Medicine | Admitting: Emergency Medicine

## 2023-09-01 DIAGNOSIS — M549 Dorsalgia, unspecified: Secondary | ICD-10-CM | POA: Diagnosis present

## 2023-09-01 DIAGNOSIS — R7309 Other abnormal glucose: Secondary | ICD-10-CM | POA: Diagnosis not present

## 2023-09-01 DIAGNOSIS — M5442 Lumbago with sciatica, left side: Secondary | ICD-10-CM | POA: Insufficient documentation

## 2023-09-01 DIAGNOSIS — G8929 Other chronic pain: Secondary | ICD-10-CM

## 2023-09-01 LAB — CBG MONITORING, ED: Glucose-Capillary: 282 mg/dL — ABNORMAL HIGH (ref 70–99)

## 2023-09-01 MED ORDER — NABUMETONE 500 MG PO TABS: 500.0000 mg | ORAL_TABLET | Freq: Every day | ORAL | 0 refills | Status: DC

## 2023-09-01 NOTE — ED Notes (Signed)
 PA student in room to evaluate patient

## 2023-09-01 NOTE — ED Notes (Signed)
 Pt reports hx of back surgery at [facility] in 2020, pt reports back pain and L buttocks pain that radiates down to L foot, pt c/o intermittent pain, pt is able to ambulate without difficulty, using OTC products with minimal to no relief.  Pt sees Spine specialist at [facility].

## 2023-09-01 NOTE — Discharge Instructions (Signed)
 Call make an appointment with your back doctor as needed.  A prescription for Relafen was sent to the pharmacy for you to begin taking twice a day with food.  Also adhere very closely to your diabetic diet as your blood sugar was 282 while in the emergency department.  You may use heat or ice to your back as needed for discomfort.  Keep your appointment for your scheduled water therapy for your back.

## 2023-09-01 NOTE — ED Provider Notes (Signed)
 [facility] Provider Note    Event Date/Time   First MD Initiated Contact with Patient 09/01/23 [phone]     (approximate)   History   Back Pain   HPI  Cheryl Roberts is a 53 y.o. female   presents to the ED with complaint of left-sided back pain which radiates down her left lower extremity.  Patient states pain has been intermittent for a couple of weeks without recent injury.  Patient has been seen for the same at [facility] spine center and is scheduled to start water aquatic therapy for chronic back pain.  She is taken multiple prescription anti-inflammatories all without any relief of her pain.  Patient denies any urinary symptoms or recent injury.  No incontinence of bowel or bladder.  She continues to be ambulatory without any assistance.      Physical Exam   Triage Vital Signs: ED Triage Vitals  Encounter Vitals Group     BP 09/01/23 0932 127/69     Systolic BP Percentile --      Diastolic BP Percentile --      Pulse Rate 09/01/23 0932 70     Resp 09/01/23 0932 18     Temp 09/01/23 0932 98.2 F (36.8 C)     Temp Source 09/01/23 0932 Oral     SpO2 09/01/23 0932 100 %     Weight 09/01/23 0931 137 lb 12.8 oz (62.5 kg)     Height 09/01/23 0931 4\' 9"  (1.448 m)     Head Circumference --      Peak Flow --      Pain Score 09/01/23 0931 5     Pain Loc --      Pain Education --      Exclude from Growth Chart --     Most recent vital signs: Vitals:   09/01/23 0932  BP: 127/69  Pulse: 70  Resp: 18  Temp: 98.2 F (36.8 C)  SpO2: 100%     General: Awake, no distress.  CV:  Good peripheral perfusion.  Resp:  Normal effort.  Abd:  No distention.  Bowel sounds normoactive x 4 quadrants. Other:  Diffuse tenderness noted L3-L4 and L5-S1 area more pronounced on the left paravertebral muscles.  Patient is able to stand and ambulate without any assistance.  Good muscle strength lower extremities bilaterally.  Straight leg raises are negative.  No CVA  tenderness appreciated.   ED Results / Procedures / Treatments   Labs (all labs ordered are listed, but only abnormal results are displayed) Labs Reviewed  CBG MONITORING, ED - Abnormal; Notable for the following components:      Result Value   Glucose-Capillary 282 (*)    All other components within normal limits      RADIOLOGY     PROCEDURES:  Critical Care performed:   Procedures   MEDICATIONS ORDERED IN ED: Medications - No data to display   IMPRESSION / MDM / ASSESSMENT AND PLAN / ED COURSE  I reviewed the triage vital signs and the nursing notes.   Differential diagnosis includes, but is not limited to, left-sided back pain with radiculopathy, skeletal pain, muscle strain, muscle spasms, diabetes under questionable control.  53 year old female presents to the ED with complaint of low back pain with radiculopathy that has been ongoing and she has been seen previously for this.  Today in the emergency department her glucose nonfasting was 282 and we discussed whether or not she actually has good control of  her diabetes.  She is strongly encouraged to call make an appoint with her PCP to review her medications and also for her to strongly adhere to her diabetic diet.  A prescription for Relafen was sent to the pharmacy for her to begin taking twice a day with food.  She is encouraged to use ice or heat to her back as needed for discomfort.      Patient's presentation is most consistent with exacerbation of chronic illness.  FINAL CLINICAL IMPRESSION(S) / ED DIAGNOSES   Final diagnoses:  Chronic left-sided low back pain with left-sided sciatica     Rx / DC Orders   ED Discharge Orders          Ordered    nabumetone (RELAFEN) 500 MG tablet  Daily        09/01/23 1140             Note:  This document was prepared using Dragon voice recognition software and may include unintentional dictation errors.   Tommi Rumps, PA-C 09/06/23 1204     Sharyn Creamer, MD 09/11/23 [phone]

## 2023-09-01 NOTE — ED Triage Notes (Signed)
 Patient to ED via POV for left sided back pain that radiates down leg. Intermittent pain x a couple of weeks. Ambulatory without difficulty.

## 2023-09-04 ENCOUNTER — Encounter: Payer: Self-pay | Admitting: *Deleted

## 2023-09-04 ENCOUNTER — Other Ambulatory Visit: Payer: Self-pay

## 2023-09-04 ENCOUNTER — Emergency Department
Admission: EM | Admit: 2023-09-04 | Discharge: 2023-09-04 | Disposition: A | Discharge status: Home / Self Care | Attending: Emergency Medicine | Admitting: Emergency Medicine

## 2023-09-04 DIAGNOSIS — J45909 Unspecified asthma, uncomplicated: Secondary | ICD-10-CM | POA: Diagnosis not present

## 2023-09-04 DIAGNOSIS — R0981 Nasal congestion: Secondary | ICD-10-CM | POA: Diagnosis present

## 2023-09-04 DIAGNOSIS — J302 Other seasonal allergic rhinitis: Secondary | ICD-10-CM | POA: Diagnosis not present

## 2023-09-04 DIAGNOSIS — E119 Type 2 diabetes mellitus without complications: Secondary | ICD-10-CM | POA: Insufficient documentation

## 2023-09-04 DIAGNOSIS — I10 Essential (primary) hypertension: Secondary | ICD-10-CM | POA: Insufficient documentation

## 2023-09-04 LAB — RESP PANEL BY RT-PCR (RSV, FLU A&B, COVID)  RVPGX2
Influenza A by PCR: NEGATIVE
Influenza B by PCR: NEGATIVE
Resp Syncytial Virus by PCR: NEGATIVE
SARS Coronavirus 2 by RT PCR: NEGATIVE

## 2023-09-04 MED ORDER — FEXOFENADINE HCL 180 MG PO TABS: 180.0000 mg | ORAL_TABLET | Freq: Every day | ORAL | 0 refills | Status: AC

## 2023-09-04 NOTE — ED Provider Notes (Signed)
 [facility] Provider Note    Event Date/Time   First MD Initiated Contact with Patient 09/04/23 1250     (approximate)   History   Allergies   HPI  Cheryl Roberts is a 53 y.o. female history of asthma, diabetes, hypertension presents emergency department with complaints of runny nose, congestion that started yesterday.  No fever or chills.  Use Flonase and it stung this morning.      Physical Exam   Triage Vital Signs: ED Triage Vitals  Encounter Vitals Group     BP 09/04/23 1230 136/76     Systolic BP Percentile --      Diastolic BP Percentile --      Pulse Rate 09/04/23 1230 73     Resp 09/04/23 1230 18     Temp 09/04/23 1230 98.5 F (36.9 C)     Temp Source 09/04/23 1230 Oral     SpO2 09/04/23 1230 100 %     Weight 09/04/23 1231 138 lb (62.6 kg)     Height 09/04/23 1231 4\' 9"  (1.448 m)     Head Circumference --      Peak Flow --      Pain Score 09/04/23 1235 1     Pain Loc --      Pain Education --      Exclude from Growth Chart --     Most recent vital signs: Vitals:   09/04/23 1230  BP: 136/76  Pulse: 73  Resp: 18  Temp: 98.5 F (36.9 C)  SpO2: 100%     General: Awake, no distress.   CV:  Good peripheral perfusion. regular rate and  rhythm Resp:  Normal effort. Lungs cta Abd:  No distention.   Other:  ENT, TMs clear, nasal mucosa normal, neck supple, throat appears to be normal, no lymphadenopathy   ED Results / Procedures / Treatments   Labs (all labs ordered are listed, but only abnormal results are displayed) Labs Reviewed  RESP PANEL BY RT-PCR (RSV, FLU A&B, COVID)  [id number]     EKG     RADIOLOGY     PROCEDURES:   Procedures Chief Complaint  Patient presents with   Allergies      MEDICATIONS ORDERED IN ED: Medications - No data to display   IMPRESSION / MDM / ASSESSMENT AND PLAN / ED COURSE  I reviewed the triage vital signs and the nursing notes.                               Differential diagnosis includes, but is not limited to, seasonal allergies, COVID, RSV, influenza  Patient's presentation is most consistent with acute illness / injury with system symptoms.   Respiratory panel reassuring  Patient's only had symptoms for 1 day, pollen is active in the area, feel she most likely has seasonal allergies as her mucus has been clear.  Do not feel that she needs an antibiotic or any further workup at this time.  Will place her on Allegra she can continue her Flonase.  She is in agreement treatment plan.  Discharged stable condition.      FINAL CLINICAL IMPRESSION(S) / ED DIAGNOSES   Final diagnoses:  Seasonal allergies     Rx / DC Orders   ED Discharge Orders          Ordered    fexofenadine (ALLEGRA) 180 MG tablet  Daily  09/04/23 1351             Note:  This document was prepared using Dragon voice recognition software and may include unintentional dictation errors.    Faythe Ghee, PA-C 09/04/23 1358    Minna Antis, MD 09/04/23 [phone]

## 2023-09-04 NOTE — ED Triage Notes (Signed)
 Pt states that she is having seasonal allergies with scratchy throat and nasal drainage/stuffiness.  She also repors bilateral ear pain which began last pm.

## 2023-09-04 NOTE — ED Notes (Signed)
 See triage note Presents with sinus drainage and scratchy throat Afebrile on arrival

## 2023-09-09 ENCOUNTER — Ambulatory Visit: Admission: RE | Admit: 2023-09-09 | Payer: Medicaid Other | Source: Home / Self Care | Admitting: Internal Medicine

## 2023-09-09 SURGERY — COLONOSCOPY WITH PROPOFOL
Anesthesia: General

## 2023-10-13 ENCOUNTER — Ambulatory Visit (LOCAL_COMMUNITY_HEALTH_CENTER): Payer: Self-pay

## 2023-10-13 DIAGNOSIS — R7611 Nonspecific reaction to tuberculin skin test without active tuberculosis: Secondary | ICD-10-CM

## 2023-10-13 NOTE — Progress Notes (Signed)
 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 Tx taken for 9 mo and completed on  11/21/2017.  Denies symptoms. TB Screening completed and form given to patient. Josephene Marrone, RN

## 2023-12-26 ENCOUNTER — Other Ambulatory Visit: Payer: Self-pay

## 2023-12-26 ENCOUNTER — Encounter: Payer: Self-pay | Admitting: Emergency Medicine

## 2023-12-26 ENCOUNTER — Emergency Department

## 2023-12-26 ENCOUNTER — Emergency Department
Admission: EM | Admit: 2023-12-26 | Discharge: 2023-12-26 | Disposition: A | Discharge status: Home / Self Care | Attending: Emergency Medicine | Admitting: Emergency Medicine

## 2023-12-26 DIAGNOSIS — M25512 Pain in left shoulder: Secondary | ICD-10-CM | POA: Diagnosis present

## 2023-12-26 DIAGNOSIS — J45909 Unspecified asthma, uncomplicated: Secondary | ICD-10-CM | POA: Insufficient documentation

## 2023-12-26 DIAGNOSIS — Y9301 Activity, walking, marching and hiking: Secondary | ICD-10-CM | POA: Insufficient documentation

## 2023-12-26 DIAGNOSIS — I10 Essential (primary) hypertension: Secondary | ICD-10-CM | POA: Insufficient documentation

## 2023-12-26 DIAGNOSIS — E119 Type 2 diabetes mellitus without complications: Secondary | ICD-10-CM | POA: Diagnosis not present

## 2023-12-26 DIAGNOSIS — W108XXA Fall (on) (from) other stairs and steps, initial encounter: Secondary | ICD-10-CM | POA: Insufficient documentation

## 2023-12-26 MED ORDER — IBUPROFEN 600 MG PO TABS: 600.0000 mg | ORAL_TABLET | Freq: Three times a day (TID) | ORAL | 0 refills | Status: DC | PRN

## 2023-12-26 NOTE — ED Provider Notes (Signed)
 [facility] Provider Note    Event Date/Time   First MD Initiated Contact with Patient 12/26/23 1045     (approximate)   History   Shoulder Pain and Back Pain   HPI  Cheryl Roberts is a 53 y.o. female   presents to the ED with complaint of left shoulder pain that started approximately 4 days ago when she fell walking down some steps.  Patient denies any head injury or loss of consciousness.  She states that she has had some low back discomfort but that is mainly her shoulder she is concerned about.  She has been taking Tylenol  and a muscle relaxant with some relief.  Patient has a history of hypertension, diabetes, low back pain, asthma, degenerative joint disease and chronic pain in the left upper extremity.      Physical Exam   Triage Vital Signs: ED Triage Vitals  Encounter Vitals Group     BP 12/26/23 1030 127/73     Girls Systolic BP Percentile --      Girls Diastolic BP Percentile --      Boys Systolic BP Percentile --      Boys Diastolic BP Percentile --      Pulse Rate 12/26/23 1028 75     Resp 12/26/23 1028 18     Temp 12/26/23 1028 98.4 F (36.9 C)     Temp Source 12/26/23 1028 Oral     SpO2 12/26/23 1028 99 %     Weight 12/26/23 1029 138 lb (62.6 kg)     Height 12/26/23 1029 4' 9 (1.448 m)     Head Circumference --      Peak Flow --      Pain Score 12/26/23 1029 8     Pain Loc --      Pain Education --      Exclude from Growth Chart --     Most recent vital signs: Vitals:   12/26/23 1028 12/26/23 1030  BP:  127/73  Pulse: 75   Resp: 18   Temp: 98.4 F (36.9 C)   SpO2: 99%      General: Awake, no distress.  CV:  Good peripheral perfusion.  Heart regular rate rhythm. Resp:  Normal effort.  Lungs are clear bilaterally. Abd:  No distention.  Other:  On examination of the left shoulder there is no gross deformity.  Range of motion is slow and guarded secondary to discomfort.  No crepitus noted.  No skin discoloration or  abrasions seen.  Radial pulse present.  No point tenderness on palpation of the lumbar spine.  Patient is able to stand and ambulate without any assistance.   ED Results / Procedures / Treatments   Labs (all labs ordered are listed, but only abnormal results are displayed) Labs Reviewed - No data to display    RADIOLOGY  Left shoulder x-ray images were reviewed by myself independent of the radiologist and was negative for fracture or dislocation.   PROCEDURES:  Critical Care performed:   Procedures   MEDICATIONS ORDERED IN ED: Medications - No data to display   IMPRESSION / MDM / ASSESSMENT AND PLAN / ED COURSE  I reviewed the triage vital signs and the nursing notes.   Differential diagnosis includes, but is not limited to, left shoulder pain, strain, dislocation, fracture.  Lumbar strain, contusion, acute on chronic low back pain.  53 year old female presents to the ED with complaint of left shoulder pain after a fall that occurred  4 days ago when she slipped going down some steps.  Low back complaints were minimal and patient states that she has chronic back pain.  She has more concerned about an injury to her left shoulder.  X-rays were done and reassuring to the patient.  She is encouraged to use ice and elevation as needed for discomfort.  Prescription for ibuprofen  was sent to the pharmacy for her to begin taking with food as needed.  She is to follow-up with her PCP if any continued problems or concerns.      Patient's presentation is most consistent with acute complicated illness / injury requiring diagnostic workup.  FINAL CLINICAL IMPRESSION(S) / ED DIAGNOSES   Final diagnoses:  Acute pain of left shoulder  Fall (on) (from) other stairs and steps, initial encounter     Rx / DC Orders   ED Discharge Orders          Ordered    ibuprofen  (ADVIL ) 600 MG tablet  Every 8 hours PRN        12/26/23 1152             Note:  This document was prepared  using Dragon voice recognition software and may include unintentional dictation errors.   Saunders Shona CROME, PA-C 12/26/23 1306    Claudene Rover, MD 12/26/23 [phone]

## 2023-12-26 NOTE — ED Notes (Signed)
 See triage note  Presents after sliding down the step  Having pain to left shoulder and lower back  Ambulates well to treatment area   No deformity noted to left shoulder.

## 2023-12-26 NOTE — ED Triage Notes (Signed)
 Patient to ED via POV for left shoulder and lower back pain. States pain started on Monday after slide down stairs. Denies hitting head or LOC.

## 2023-12-26 NOTE — Discharge Instructions (Signed)
 Follow-up with your primary care provider if any continued problems or concerns.  Ice to your shoulder as needed for discomfort.  You may continue taking Tylenol  or ibuprofen .  A prescription for ibuprofen  was sent to the pharmacy for you to take every 8 hours with food.

## 2024-02-05 ENCOUNTER — Other Ambulatory Visit: Payer: Self-pay

## 2024-02-05 ENCOUNTER — Emergency Department

## 2024-02-05 ENCOUNTER — Encounter: Payer: Self-pay | Admitting: Emergency Medicine

## 2024-02-05 ENCOUNTER — Emergency Department
Admission: EM | Admit: 2024-02-05 | Discharge: 2024-02-05 | Disposition: A | Discharge status: Home / Self Care | Attending: Emergency Medicine | Admitting: Emergency Medicine

## 2024-02-05 DIAGNOSIS — R300 Dysuria: Secondary | ICD-10-CM | POA: Diagnosis present

## 2024-02-05 DIAGNOSIS — I10 Essential (primary) hypertension: Secondary | ICD-10-CM | POA: Insufficient documentation

## 2024-02-05 DIAGNOSIS — E119 Type 2 diabetes mellitus without complications: Secondary | ICD-10-CM | POA: Insufficient documentation

## 2024-02-05 DIAGNOSIS — J4520 Mild intermittent asthma, uncomplicated: Secondary | ICD-10-CM | POA: Insufficient documentation

## 2024-02-05 LAB — URINALYSIS, ROUTINE W REFLEX MICROSCOPIC
Bilirubin Urine: NEGATIVE
Glucose, UA: >=500 mg/dL — AB
Ketones, ur: NEGATIVE mg/dL
Leukocytes,Ua: NEGATIVE
Nitrite: NEGATIVE
Protein, ur: 30 mg/dL — AB
RBC / HPF: >50 RBC/hpf (ref 0–5)
Specific Gravity, Urine: 1.026 (ref 1.005–1.030)
pH: 5 (ref 5.0–8.0)

## 2024-02-05 LAB — BASIC METABOLIC PANEL WITH GFR
Anion gap: 9 (ref 5–15)
BUN: 15 mg/dL (ref 6–20)
CO2: 29 mmol/L (ref 22–32)
Calcium: 9.3 mg/dL (ref 8.9–10.3)
Chloride: 102 mmol/L (ref 98–111)
Creatinine, Ser: 0.73 mg/dL (ref 0.44–1.00)
GFR, Estimated: >60 mL/min (ref >60)
Glucose, Bld: 200 mg/dL — ABNORMAL HIGH (ref 70–99)
Potassium: 3.5 mmol/L (ref 3.5–5.1)
Sodium: 140 mmol/L (ref 135–145)

## 2024-02-05 LAB — CBC WITH DIFFERENTIAL/PLATELET
Abs Immature Granulocytes: 0.01 K/uL (ref 0.00–0.07)
Basophils Absolute: 0 K/uL (ref 0.0–0.1)
Basophils Relative: 0 %
Eosinophils Absolute: 0.1 K/uL (ref 0.0–0.5)
Eosinophils Relative: 1 %
HCT: 43.2 % (ref 36.0–46.0)
Hemoglobin: 13.6 g/dL (ref 12.0–15.0)
Immature Granulocytes: 0 %
Lymphocytes Relative: 37 %
Lymphs Abs: 2.8 K/uL (ref 0.7–4.0)
MCH: 26.3 pg (ref 26.0–34.0)
MCHC: 31.5 g/dL (ref 30.0–36.0)
MCV: 83.4 fL (ref 80.0–100.0)
Monocytes Absolute: 0.5 K/uL (ref 0.1–1.0)
Monocytes Relative: 7 %
Neutro Abs: 4.2 K/uL (ref 1.7–7.7)
Neutrophils Relative %: 55 %
Platelets: 251 K/uL (ref 150–400)
RBC: 5.18 MIL/uL — ABNORMAL HIGH (ref 3.87–5.11)
RDW: 13.7 % (ref 11.5–15.5)
WBC: 7.6 K/uL (ref 4.0–10.5)
nRBC: 0 % (ref 0.0–0.2)

## 2024-02-05 LAB — CK: Total CK: 117 U/L (ref 38–234)

## 2024-02-05 MED ORDER — CEFDINIR 300 MG PO CAPS
300.0000 mg | ORAL_CAPSULE | Freq: Two times a day (BID) | ORAL | 0 refills | Status: AC
Start: 2024-02-05 — End: 2024-02-12

## 2024-02-05 NOTE — Discharge Instructions (Addendum)
Please take the antibiotic as prescribed.  Please follow-up with your outpatient provider.  Please return for any new, worsening, or change in symptoms or other concerns.  It was a pleasure caring for you today. 

## 2024-02-05 NOTE — ED Provider Notes (Signed)
 [facility] Provider Note    Event Date/Time   First MD Initiated Contact with Patient 02/05/24 [phone]     (approximate)   History   Dysuria   HPI  Cheryl Roberts is a 53 y.o. female with a past medical history of type 2 diabetes on Jardiance  who presents today for evaluation of burning with urination and discolored urine.  Patient reports that she started Jardiance  approximately 1 month ago and over the past couple of days she has had burning with urination and dark yellow urine.  Her urine is at the bedside.  No abdominal pain.  No flank pain.  No nausea or vomiting.  No fevers or chills.  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   Spinal stenosis in cervical region 01/17/2017   Chronic low back pain without sciatica 06/22/2016   Strain of left trapezius muscle 06/22/2016   Allergic rhinitis due to allergen 03/26/2016   Hyperlipidemia associated with type 2 diabetes mellitus (HCC) 03/15/2016   Hypertension 02/09/2016   Type 2 diabetes mellitus with other specified complication (HCC) 02/09/2016   DJD (degenerative joint disease) of cervical spine 02/09/2016   Obesity (BMI 30.0-34.9) 02/09/2016   Asthma, mild intermittent 02/09/2016   Sialoadenitis 02/28/2003          Physical Exam   Triage Vital Signs: ED Triage Vitals  Encounter Vitals Group     BP 02/05/24 0721 135/75     Girls Systolic BP Percentile --      Girls Diastolic BP Percentile --      Boys Systolic BP Percentile --      Boys Diastolic BP Percentile --      Pulse Rate 02/05/24 0721 72     Resp 02/05/24 0721 18     Temp 02/05/24 0721 97.7 F (36.5 C)     Temp Source 02/05/24 0721 Oral      SpO2 02/05/24 0721 100 %     Weight 02/05/24 0720 138 lb (62.6 kg)     Height 02/05/24 0720 4' 9 (1.448 m)     Head Circumference --      Peak Flow --      Pain Score 02/05/24 0720 0     Pain Loc --      Pain Education --      Exclude from Growth Chart --     Most recent vital signs: Vitals:   02/05/24 0721 02/05/24 0727  BP: 135/75   Pulse: 72   Resp: 18   Temp: 97.7 F (36.5 C)   SpO2: 100% 100%    Physical Exam Vitals and nursing note reviewed.  Constitutional:      General: Awake and alert. No acute distress.    Appearance: Normal appearance. The patient is normal weight.  HENT:     Head: Normocephalic and atraumatic.     Mouth: Mucous membranes are moist.  Eyes:     General: PERRL. Normal EOMs        Right eye: No discharge.        Left eye: No discharge.     Conjunctiva/sclera: Conjunctivae normal.  Cardiovascular:     Rate and Rhythm: Normal rate and regular rhythm.  Pulses: Normal pulses.  Pulmonary:     Effort: Pulmonary effort is normal. No respiratory distress.     Breath sounds: Normal breath sounds.  Abdominal:     Abdomen is soft. There is no abdominal tenderness. No rebound or guarding. No distention.  No CVAT Musculoskeletal:        General: No swelling. Normal range of motion.     Cervical back: Normal range of motion and neck supple.  Skin:    General: Skin is warm and dry.     Capillary Refill: Capillary refill takes less than 2 seconds.     Findings: No rash.  Neurological:     Mental Status: The patient is awake and alert.      ED Results / Procedures / Treatments   Labs (all labs ordered are listed, but only abnormal results are displayed) Labs Reviewed  URINALYSIS, ROUTINE W REFLEX MICROSCOPIC - Abnormal; Notable for the following components:      Result Value   Color, Urine YELLOW (*)    APPearance CLOUDY (*)    Glucose, UA >=500 (*)    Hgb urine dipstick LARGE (*)    Protein, ur 30 (*)    Bacteria, UA FEW (*)    All  other components within normal limits  BASIC METABOLIC PANEL WITH GFR - Abnormal; Notable for the following components:   Glucose, Bld 200 (*)    All other components within normal limits  CBC WITH DIFFERENTIAL/PLATELET - Abnormal; Notable for the following components:   RBC 5.18 (*)    All other components within normal limits  URINE CULTURE  CK     EKG     RADIOLOGY I independently reviewed and interpreted imaging and agree with radiologists findings.     PROCEDURES:  Critical Care performed:   Procedures   MEDICATIONS ORDERED IN ED: Medications - No data to display   IMPRESSION / MDM / ASSESSMENT AND PLAN / ED COURSE  I reviewed the triage vital signs and the nursing notes.   Differential diagnosis includes, but is not limited to, UTI, glucosuria, dysuria, hematuria.  Patient is awake and alert, hemodynamically stable and afebrile.  She has her urine at the bedside, it is dark yellow, no [employer] colored urine.  She has no abdominal tenderness or CVA tenderness to suggest intra-abdominal pathology or pyelonephritis.  No fevers or chills.  Labs are overall reassuring.  She has hyperglycemia, however normal anion gap, normal bicarb, not consistent with DKA.  Normal creatinine.  Urinalysis reveals hemoglobin with rare bacteria.  CPK was added on and this was negative, not consistent with rhabdomyolysis.  CT renal stone obtained reveals kidney stones and no ureteral stone.  Given her burning with urination we will treat for UTI though I did recommend close outpatient follow-up with urology and the appropriate information was provided.  We discussed strict return precautions in the meantime.  Patient understands and agrees with plan.  She was discharged in stable condition   Patient's presentation is most consistent with acute complicated illness / injury requiring diagnostic workup.    FINAL CLINICAL IMPRESSION(S) / ED DIAGNOSES   Final diagnoses:  Dysuria      Rx / DC Orders   ED Discharge Orders          Ordered    cefdinir  (OMNICEF ) 300 MG capsule  2 times daily        02/05/24 1102             Note:  This  document was prepared using Conservation officer, historic buildings and may include unintentional dictation errors.   Rebecca Cairns E, PA-C 02/05/24 1228    Dorothyann Drivers, MD 02/05/24 1451

## 2024-02-05 NOTE — ED Triage Notes (Signed)
 Patient to ED via POV for urine discoloration. PT reports orange colored urine. States she is just a little burning. Reports newly taking jardiance .

## 2024-02-06 LAB — URINE CULTURE: Culture: <10000 — AB

## 2024-05-10 ENCOUNTER — Emergency Department
Admission: EM | Admit: 2024-05-10 | Discharge: 2024-05-10 | Disposition: A | Discharge status: Home / Self Care | Payer: Self-pay | Attending: Emergency Medicine | Admitting: Emergency Medicine

## 2024-05-10 ENCOUNTER — Encounter: Payer: Self-pay | Admitting: Emergency Medicine

## 2024-05-10 ENCOUNTER — Other Ambulatory Visit: Payer: Self-pay

## 2024-05-10 DIAGNOSIS — S46911A Strain of unspecified muscle, fascia and tendon at shoulder and upper arm level, right arm, initial encounter: Secondary | ICD-10-CM | POA: Insufficient documentation

## 2024-05-10 DIAGNOSIS — X58XXXA Exposure to other specified factors, initial encounter: Secondary | ICD-10-CM | POA: Insufficient documentation

## 2024-05-10 DIAGNOSIS — I10 Essential (primary) hypertension: Secondary | ICD-10-CM | POA: Insufficient documentation

## 2024-05-10 DIAGNOSIS — E119 Type 2 diabetes mellitus without complications: Secondary | ICD-10-CM | POA: Insufficient documentation

## 2024-05-10 MED ORDER — NAPROXEN 500 MG PO TABS: 500.0000 mg | ORAL_TABLET | Freq: Two times a day (BID) | ORAL | 2 refills | Status: AC

## 2024-05-10 NOTE — ED Triage Notes (Signed)
 C/O right shoulder, scapular pain. Pain with movement, reaching.  Pain started days ago, but worse yesterday.

## 2024-05-10 NOTE — ED Notes (Signed)
 Pt states right shoulder pain and denies any falls. Pt unsure if she pulled something. Pt states pain when trying to extend the arm.

## 2024-05-10 NOTE — ED Provider Notes (Signed)
   [facility] Provider Note    Event Date/Time   First MD Initiated Contact with Patient 05/10/24 [phone]     (approximate)   History   Shoulder Pain   HPI  Cheryl Roberts is a 53 y.o. female with a history of diabetes, hypertension who presents with complaints of right shoulder pain.  Patient reports her last couple days she has had pain in her posterior right shoulder worse with palpation and extending her right arm.  She reports it is uncomfortable to sleep on because she sleeps on her side.  No trauma to the area.  No chest pain.  No nausea vomiting.  No rash     Physical Exam   Triage Vital Signs: ED Triage Vitals [05/10/24 0715]  Encounter Vitals Group     BP 128/83     Girls Systolic BP Percentile      Girls Diastolic BP Percentile      Boys Systolic BP Percentile      Boys Diastolic BP Percentile      Pulse Rate 66     Resp 16     Temp 97.8 F (36.6 C)     Temp Source Oral     SpO2 100 %     Weight 62.6 kg (138 lb 0.1 oz)     Height 1.448 m (4' 9)     Head Circumference      Peak Flow      Pain Score 10     Pain Loc      Pain Education      Exclude from Growth Chart     Most recent vital signs: Vitals:   05/10/24 0715  BP: 128/83  Pulse: 66  Resp: 16  Temp: 97.8 F (36.6 C)  SpO2: 100%     General: Awake, no distress.  CV:  Good peripheral perfusion.  Resp:  Normal effort.  Abd:  No distention.  Other:  Point tenderness lateral edge of the scapula on the right, no rash,    ED Results / Procedures / Treatments   Labs (all labs ordered are listed, but only abnormal results are displayed) Labs Reviewed - No data to display   EKG     RADIOLOGY     PROCEDURES:  Critical Care performed:   Procedures   MEDICATIONS ORDERED IN ED: Medications - No data to display   IMPRESSION / MDM / ASSESSMENT AND PLAN / ED COURSE  I reviewed the triage vital signs and the nursing notes. Patient's presentation is most  consistent with acute, uncomplicated illness.  Patient presents with right shoulder pain as detailed above, no trauma to suggest fracture.  Point tenderness lateral border of the scapula most consistent with muscle strain/spasm.  No rash to suggest shingles  Will treat with NSAIDs, she has Flexeril  at home, outpatient follow-up recommended        FINAL CLINICAL IMPRESSION(S) / ED DIAGNOSES   Final diagnoses:  Strain of right shoulder, initial encounter     Rx / DC Orders   ED Discharge Orders          Ordered    naproxen  (NAPROSYN ) 500 MG tablet  2 times daily with meals        05/10/24 0733             Note:  This document was prepared using Dragon voice recognition software and may include unintentional dictation errors.   Arlander Charleston, MD 05/10/24 [phone]

## 2024-06-27 ENCOUNTER — Other Ambulatory Visit: Payer: Self-pay

## 2024-06-27 ENCOUNTER — Emergency Department
Admission: EM | Admit: 2024-06-27 | Discharge: 2024-06-27 | Disposition: A | Discharge status: Home / Self Care | Payer: Self-pay | Attending: Emergency Medicine | Admitting: Emergency Medicine

## 2024-06-27 DIAGNOSIS — I10 Essential (primary) hypertension: Secondary | ICD-10-CM | POA: Insufficient documentation

## 2024-06-27 DIAGNOSIS — R040 Epistaxis: Secondary | ICD-10-CM | POA: Insufficient documentation

## 2024-06-27 DIAGNOSIS — E119 Type 2 diabetes mellitus without complications: Secondary | ICD-10-CM | POA: Insufficient documentation

## 2024-06-27 NOTE — ED Triage Notes (Signed)
 Pt to ED for nosebleed this morning at work which she was able to stop with 5-10 minutes of direct pressure. Then she blew her nose around 45 minutes ago and it bled again. No bleeding at this time. Hx HTN and hypertensive in triage. Takes hydrochlorothiazide  and lisinopril /took this AM.

## 2024-06-27 NOTE — ED Provider Notes (Signed)
" ° °  [facility] Provider Note    Event Date/Time   First MD Initiated Contact with Patient 06/27/24 1508     (approximate)   History   Epistaxis   HPI  Cheryl Roberts is a 54 y.o. female with history of diabetes, hypertension who presents with reports of epistaxis.  Patient reports this began this morning at work, stopped with compression.  Started again after she blew her nose.  No active bleeding at this time.  No other complaints.     Physical Exam   Triage Vital Signs: ED Triage Vitals  Encounter Vitals Group     BP 06/27/24 1450 (!) 163/80     Girls Systolic BP Percentile --      Girls Diastolic BP Percentile --      Boys Systolic BP Percentile --      Boys Diastolic BP Percentile --      Pulse Rate 06/27/24 1450 74     Resp 06/27/24 1450 20     Temp 06/27/24 1450 98.1 F (36.7 C)     Temp Source 06/27/24 1450 Oral     SpO2 06/27/24 1450 100 %     Weight 06/27/24 1448 64.9 kg (143 lb)     Height 06/27/24 1448 1.422 m (4' 8)     Head Circumference --      Peak Flow --      Pain Score 06/27/24 1448 0     Pain Loc --      Pain Education --      Exclude from Growth Chart --     Most recent vital signs: Vitals:   06/27/24 1450  BP: (!) 163/80  Pulse: 74  Resp: 20  Temp: 98.1 F (36.7 C)  SpO2: 100%     General: Awake, no distress.  CV:  Good peripheral perfusion.  Resp:  Normal effort.  Abd:  No distention.  Other:  Dried blood within the left naris, no active bleeding   ED Results / Procedures / Treatments   Labs (all labs ordered are listed, but only abnormal results are displayed) Labs Reviewed - No data to display   EKG     RADIOLOGY     PROCEDURES:  Critical Care performed:   Procedures   MEDICATIONS ORDERED IN ED: Medications - No data to display   IMPRESSION / MDM / ASSESSMENT AND PLAN / ED COURSE  I reviewed the triage vital signs and the nursing notes. Patient's presentation is most  consistent with acute, uncomplicated illness.  Bleeding has resolved, outpatient follow-up as needed        FINAL CLINICAL IMPRESSION(S) / ED DIAGNOSES   Final diagnoses:  Left-sided epistaxis     Rx / DC Orders   ED Discharge Orders     None        Note:  This document was prepared using Dragon voice recognition software and may include unintentional dictation errors.   Arlander Charleston, MD 06/27/24 1557  "
